# Patient Record
Sex: Female | Born: 1935 | Race: White | Hispanic: No | Marital: Married | Smoking: Former smoker
Health system: Southern US, Community
[De-identification: ages and names within clinical notes are randomized; demographics above are authoritative.]

## PROBLEM LIST (undated history)

## (undated) DIAGNOSIS — I10 Essential (primary) hypertension: Secondary | ICD-10-CM

## (undated) DIAGNOSIS — H01009 Unspecified blepharitis unspecified eye, unspecified eyelid: Secondary | ICD-10-CM

## (undated) DIAGNOSIS — M810 Age-related osteoporosis without current pathological fracture: Secondary | ICD-10-CM

## (undated) DIAGNOSIS — O009 Unspecified ectopic pregnancy without intrauterine pregnancy: Secondary | ICD-10-CM

## (undated) DIAGNOSIS — Z923 Personal history of irradiation: Secondary | ICD-10-CM

## (undated) DIAGNOSIS — C50919 Malignant neoplasm of unspecified site of unspecified female breast: Secondary | ICD-10-CM

## (undated) DIAGNOSIS — D369 Benign neoplasm, unspecified site: Secondary | ICD-10-CM

## (undated) HISTORY — DX: Unspecified blepharitis unspecified eye, unspecified eyelid: H01.009

## (undated) HISTORY — PX: COLONOSCOPY W/ POLYPECTOMY: SHX1380

## (undated) HISTORY — DX: Benign neoplasm, unspecified site: D36.9

## (undated) HISTORY — DX: Unspecified ectopic pregnancy without intrauterine pregnancy: O00.90

## (undated) HISTORY — PX: CATARACT EXTRACTION: SUR2

## (undated) HISTORY — DX: Essential (primary) hypertension: I10

## (undated) HISTORY — PX: ECTOPIC PREGNANCY SURGERY: SHX613

## (undated) HISTORY — DX: Malignant neoplasm of unspecified site of unspecified female breast: C50.919

## (undated) HISTORY — DX: Age-related osteoporosis without current pathological fracture: M81.0

---

## 1990-12-31 DIAGNOSIS — C50919 Malignant neoplasm of unspecified site of unspecified female breast: Secondary | ICD-10-CM

## 1990-12-31 HISTORY — DX: Malignant neoplasm of unspecified site of unspecified female breast: C50.919

## 1992-12-31 HISTORY — PX: TOTAL ABDOMINAL HYSTERECTOMY W/ BILATERAL SALPINGOOPHORECTOMY: SHX83

## 1996-12-31 HISTORY — PX: KNEE ARTHROSCOPY: SUR90

## 1999-07-13 ENCOUNTER — Other Ambulatory Visit: Admission: RE | Admit: 1999-07-13 | Discharge: 1999-07-13 | Payer: Self-pay | Admitting: Obstetrics and Gynecology

## 1999-10-10 ENCOUNTER — Other Ambulatory Visit: Admission: RE | Admit: 1999-10-10 | Discharge: 1999-10-10 | Payer: Self-pay | Admitting: Gastroenterology

## 1999-10-10 DIAGNOSIS — D369 Benign neoplasm, unspecified site: Secondary | ICD-10-CM

## 1999-10-10 HISTORY — DX: Benign neoplasm, unspecified site: D36.9

## 2000-03-22 ENCOUNTER — Ambulatory Visit (HOSPITAL_COMMUNITY): Admission: RE | Admit: 2000-03-22 | Discharge: 2000-03-22 | Payer: Self-pay | Admitting: Surgery

## 2000-03-22 ENCOUNTER — Encounter: Payer: Self-pay | Admitting: Surgery

## 2000-09-25 ENCOUNTER — Other Ambulatory Visit: Admission: RE | Admit: 2000-09-25 | Discharge: 2000-09-25 | Payer: Self-pay | Admitting: Obstetrics and Gynecology

## 2000-09-30 ENCOUNTER — Encounter: Payer: Self-pay | Admitting: Obstetrics and Gynecology

## 2000-09-30 ENCOUNTER — Encounter: Admission: RE | Admit: 2000-09-30 | Discharge: 2000-09-30 | Payer: Self-pay | Admitting: Obstetrics and Gynecology

## 2002-01-29 ENCOUNTER — Other Ambulatory Visit: Admission: RE | Admit: 2002-01-29 | Discharge: 2002-01-29 | Payer: Self-pay | Admitting: Obstetrics and Gynecology

## 2005-12-31 HISTORY — PX: HAND SURGERY: SHX662

## 2006-12-26 ENCOUNTER — Ambulatory Visit (HOSPITAL_COMMUNITY): Admission: RE | Admit: 2006-12-26 | Discharge: 2006-12-27 | Payer: Self-pay | Admitting: Orthopedic Surgery

## 2007-04-28 ENCOUNTER — Other Ambulatory Visit: Admission: RE | Admit: 2007-04-28 | Discharge: 2007-04-28 | Payer: Self-pay | Admitting: Obstetrics and Gynecology

## 2010-12-31 HISTORY — PX: OTHER SURGICAL HISTORY: SHX169

## 2011-07-17 ENCOUNTER — Encounter: Payer: Self-pay | Admitting: Internal Medicine

## 2012-09-18 ENCOUNTER — Encounter: Payer: Self-pay | Admitting: Internal Medicine

## 2013-09-24 ENCOUNTER — Encounter: Payer: Self-pay | Admitting: Obstetrics & Gynecology

## 2014-01-27 ENCOUNTER — Ambulatory Visit: Payer: Self-pay | Admitting: Obstetrics & Gynecology

## 2014-01-28 ENCOUNTER — Encounter: Payer: Self-pay | Admitting: Obstetrics & Gynecology

## 2014-01-29 ENCOUNTER — Ambulatory Visit: Payer: Self-pay | Admitting: Obstetrics & Gynecology

## 2014-02-02 ENCOUNTER — Encounter: Payer: Self-pay | Admitting: Obstetrics & Gynecology

## 2014-02-02 ENCOUNTER — Ambulatory Visit (INDEPENDENT_AMBULATORY_CARE_PROVIDER_SITE_OTHER): Payer: Medicare HMO | Admitting: Obstetrics & Gynecology

## 2014-02-02 VITALS — BP 141/60 | HR 86 | Resp 16 | Ht 60.5 in | Wt 113.0 lb

## 2014-02-02 DIAGNOSIS — Z01419 Encounter for gynecological examination (general) (routine) without abnormal findings: Secondary | ICD-10-CM

## 2014-02-02 NOTE — Progress Notes (Signed)
80 y.o. [id number] MarriedCaucasianF here for annual exam.  Holiday was nice.  Family was here.    Started [employer].  Planning on doing this for two years.  Dr. Dagmar Hait is monitoring.    Seeing Jarome Matin about overall body itch.  She has two places on her face that he is working on  Patient's last menstrual period was 12/31/1992.          Sexually active: no  The current method of family planning is none.    Exercising: yes  walking Smoker:  no  Health Maintenance: Pap:  2008 History of abnormal Pap:  no MMG:  09/2013 Colonoscopy:  2005 BMD:   10/2011 TDaP: Dr. Dagmar Hait follows Screening Labs: Dr. Dagmar Hait, Hb today: PCP, Urine today: PCP   reports that she has quit smoking. She has never used smokeless tobacco. She reports that she drinks about 3.0 ounces of alcohol per week. She reports that she does not use illicit drugs.  Past Medical History  Diagnosis Date  . Breast cancer 1992    right breast   . Hypertension   . Blepharitis   . Osteoporosis   . Ectopic pregnancy   . Colon polyp     Past Surgical History  Procedure Laterality Date  . Ectopic pregnancy surgery      salpingectomy  . Colonoscopy w/ polypectomy    . Hand surgery      right hand-ruptured tendon  . Cataract extraction      2010 right eye, 2011 left eye  . Cyst removed      from right wrist  . Total abdominal hysterectomy w/ bilateral salpingoophorectomy  1994  . Knee arthroscopy  1998    Current Outpatient Prescriptions  Medication Sig Dispense Refill  . amLODipine (NORVASC) 5 MG tablet Take 5 mg by mouth daily.      Marland Kitchen aspirin 81 MG tablet Take 81 mg by mouth daily.      . BD PEN NEEDLE NANO U/F 32G X 4 MM MISC       . Calcium Carbonate-Vitamin D (CALCIUM + D PO) Take 600 mg by mouth 3 (three) times daily.      . clobetasol cream (TEMOVATE) 0.05 %       . doxycycline (VIBRAMYCIN) 100 MG capsule Take 100 mg by mouth daily.      Marland Kitchen FORTEO 600 MCG/2.4ML SOLN       . potassium chloride (K-DUR) 10 MEQ tablet  Take 10 mEq by mouth daily.      . simvastatin (ZOCOR) 20 MG tablet Take 20 mg by mouth daily.      Marland Kitchen telmisartan-hydrochlorothiazide (MICARDIS HCT) 80-12.5 MG per tablet Take 1 tablet by mouth daily.       No current facility-administered medications for this visit.    Family History  Problem Relation Age of Onset  . Osteoporosis Mother   . CVA Father   . Hypertension Father   . Arthritis Mother   . Hypertension Mother     ROS:  Pertinent items are noted in HPI.  Otherwise, a comprehensive ROS was negative.  Exam:   BP 141/60  Pulse 86  Resp 16  Ht 5' 0.5" (1.537 m)  Wt 113 lb (51.256 kg)  BMI 21.70 kg/m2  LMP 12/31/1992  Weight change: -3lb   Height: 5' 0.5" (153.7 cm)  Ht Readings from Last 3 Encounters:  02/02/14 5' 0.5" (1.537 m)    General appearance: alert, cooperative and appears stated age Head: Normocephalic, without obvious  abnormality, atraumatic Neck: no adenopathy, supple, symmetrical, trachea midline and thyroid normal to inspection and palpation Lungs: clear to auscultation bilaterally Breasts: normal appearance, no masses or tenderness, dense findings on right breast consistent with radiation changes Heart: regular rate and rhythm Abdomen: soft, non-tender; bowel sounds normal; no masses,  no organomegaly Extremities: extremities normal, atraumatic, no cyanosis or edema Skin: Skin color, texture, turgor normal. No rashes or lesions Lymph nodes: Cervical, supraclavicular, and axillary nodes normal. No abnormal inguinal nodes palpated Neurologic: Grossly normal   Pelvic: External genitalia:  no lesions              Urethra:  normal appearing urethra with no masses, tenderness or lesions              Bartholins and Skenes: normal                 Vagina: normal appearing vagina with normal color and discharge, no lesions              Cervix: absent              Pap taken: no Bimanual Exam:  Uterus:  No masses              Adnexa: no masses               Rectovaginal: Confirms               Anus:  normal sphincter tone, no lesions  A:  Well Woman with normal exam PMP, no HRT H/O breast cancer 1992 H/O TAH/BSO  P:   Mammogram yearly.  Release signed. pap smear not indicated Labs and vaccines with Dr. Dagmar Hait return annually or prn  An After Visit Summary was printed and given to the patient.

## 2014-02-02 NOTE — Patient Instructions (Signed)

## 2014-03-19 ENCOUNTER — Encounter (HOSPITAL_COMMUNITY): Payer: Self-pay | Admitting: Emergency Medicine

## 2014-03-19 ENCOUNTER — Inpatient Hospital Stay (HOSPITAL_COMMUNITY)
Admission: EM | Admit: 2014-03-19 | Discharge: 2014-03-29 | DRG: 335 | Disposition: A | Discharge status: Home / Self Care | Payer: Medicare PPO | Attending: General Surgery | Admitting: General Surgery

## 2014-03-19 ENCOUNTER — Emergency Department (HOSPITAL_COMMUNITY): Payer: Medicare PPO

## 2014-03-19 ENCOUNTER — Inpatient Hospital Stay (HOSPITAL_COMMUNITY): Payer: Medicare PPO

## 2014-03-19 DIAGNOSIS — Z853 Personal history of malignant neoplasm of breast: Secondary | ICD-10-CM

## 2014-03-19 DIAGNOSIS — I420 Dilated cardiomyopathy: Secondary | ICD-10-CM | POA: Diagnosis present

## 2014-03-19 DIAGNOSIS — J9819 Other pulmonary collapse: Secondary | ICD-10-CM | POA: Diagnosis not present

## 2014-03-19 DIAGNOSIS — K56609 Unspecified intestinal obstruction, unspecified as to partial versus complete obstruction: Secondary | ICD-10-CM | POA: Diagnosis present

## 2014-03-19 DIAGNOSIS — I447 Left bundle-branch block, unspecified: Secondary | ICD-10-CM | POA: Diagnosis present

## 2014-03-19 DIAGNOSIS — I10 Essential (primary) hypertension: Secondary | ICD-10-CM | POA: Diagnosis present

## 2014-03-19 DIAGNOSIS — I509 Heart failure, unspecified: Secondary | ICD-10-CM | POA: Diagnosis present

## 2014-03-19 DIAGNOSIS — I2109 ST elevation (STEMI) myocardial infarction involving other coronary artery of anterior wall: Secondary | ICD-10-CM | POA: Diagnosis present

## 2014-03-19 DIAGNOSIS — E440 Moderate protein-calorie malnutrition: Secondary | ICD-10-CM | POA: Insufficient documentation

## 2014-03-19 DIAGNOSIS — K560 Paralytic ileus: Secondary | ICD-10-CM | POA: Diagnosis not present

## 2014-03-19 DIAGNOSIS — Z7982 Long term (current) use of aspirin: Secondary | ICD-10-CM

## 2014-03-19 DIAGNOSIS — K565 Intestinal adhesions [bands], unspecified as to partial versus complete obstruction: Principal | ICD-10-CM | POA: Diagnosis present

## 2014-03-19 DIAGNOSIS — E876 Hypokalemia: Secondary | ICD-10-CM | POA: Diagnosis not present

## 2014-03-19 DIAGNOSIS — Z87891 Personal history of nicotine dependence: Secondary | ICD-10-CM

## 2014-03-19 DIAGNOSIS — M810 Age-related osteoporosis without current pathological fracture: Secondary | ICD-10-CM | POA: Diagnosis present

## 2014-03-19 DIAGNOSIS — I5181 Takotsubo syndrome: Secondary | ICD-10-CM | POA: Diagnosis present

## 2014-03-19 DIAGNOSIS — E785 Hyperlipidemia, unspecified: Secondary | ICD-10-CM | POA: Diagnosis present

## 2014-03-19 DIAGNOSIS — J9600 Acute respiratory failure, unspecified whether with hypoxia or hypercapnia: Secondary | ICD-10-CM | POA: Diagnosis not present

## 2014-03-19 DIAGNOSIS — H01009 Unspecified blepharitis unspecified eye, unspecified eyelid: Secondary | ICD-10-CM | POA: Diagnosis present

## 2014-03-19 DIAGNOSIS — M129 Arthropathy, unspecified: Secondary | ICD-10-CM | POA: Diagnosis present

## 2014-03-19 DIAGNOSIS — I5041 Acute combined systolic (congestive) and diastolic (congestive) heart failure: Secondary | ICD-10-CM | POA: Diagnosis present

## 2014-03-19 DIAGNOSIS — J9601 Acute respiratory failure with hypoxia: Secondary | ICD-10-CM

## 2014-03-19 LAB — CBC WITH DIFFERENTIAL/PLATELET
BASOS PCT: 0 % (ref 0–1)
Basophils Absolute: 0 10*3/uL (ref 0.0–0.1)
EOS PCT: 0 % (ref 0–5)
Eosinophils Absolute: 0 10*3/uL (ref 0.0–0.7)
HEMATOCRIT: 43.2 % (ref 36.0–46.0)
HEMOGLOBIN: 15.6 g/dL — AB (ref 12.0–15.0)
Lymphocytes Relative: 5 % — ABNORMAL LOW (ref 12–46)
Lymphs Abs: 0.6 10*3/uL — ABNORMAL LOW (ref 0.7–4.0)
MCH: 35 pg — ABNORMAL HIGH (ref 26.0–34.0)
MCHC: 36.1 g/dL — AB (ref 30.0–36.0)
MCV: 96.9 fL (ref 78.0–100.0)
MONO ABS: 0.4 10*3/uL (ref 0.1–1.0)
MONOS PCT: 4 % (ref 3–12)
NEUTROS ABS: 9.9 10*3/uL — AB (ref 1.7–7.7)
Neutrophils Relative %: 91 % — ABNORMAL HIGH (ref 43–77)
Platelets: 209 10*3/uL (ref 150–400)
RBC: 4.46 MIL/uL (ref 3.87–5.11)
RDW: 12.5 % (ref 11.5–15.5)
WBC: 10.9 10*3/uL — ABNORMAL HIGH (ref 4.0–10.5)

## 2014-03-19 LAB — URINALYSIS, ROUTINE W REFLEX MICROSCOPIC
Bilirubin Urine: NEGATIVE
Glucose, UA: 250 mg/dL — AB
Hgb urine dipstick: NEGATIVE
KETONES UR: 15 mg/dL — AB
Leukocytes, UA: NEGATIVE
NITRITE: NEGATIVE
Protein, ur: NEGATIVE mg/dL
Specific Gravity, Urine: 1.011 (ref 1.005–1.030)
UROBILINOGEN UA: 0.2 mg/dL (ref 0.0–1.0)
pH: 8 (ref 5.0–8.0)

## 2014-03-19 LAB — URINE MICROSCOPIC-ADD ON

## 2014-03-19 LAB — COMPREHENSIVE METABOLIC PANEL
ALT: 20 U/L (ref 0–35)
AST: 34 U/L (ref 0–37)
Albumin: 4.8 g/dL (ref 3.5–5.2)
Alkaline Phosphatase: 85 U/L (ref 39–117)
BILIRUBIN TOTAL: 0.8 mg/dL (ref 0.3–1.2)
BUN: 19 mg/dL (ref 6–23)
CALCIUM: 10.7 mg/dL — AB (ref 8.4–10.5)
CHLORIDE: 90 meq/L — AB (ref 96–112)
CO2: 25 meq/L (ref 19–32)
Creatinine, Ser: 0.5 mg/dL (ref 0.50–1.10)
GFR calc Af Amer: >90 mL/min (ref >90)
GFR calc non Af Amer: 88 mL/min — ABNORMAL LOW (ref >90)
Glucose, Bld: 140 mg/dL — ABNORMAL HIGH (ref 70–99)
Potassium: 3.8 mEq/L (ref 3.7–5.3)
Sodium: 134 mEq/L — ABNORMAL LOW (ref 137–147)
Total Protein: 7.6 g/dL (ref 6.0–8.3)

## 2014-03-19 LAB — MAGNESIUM: MAGNESIUM: 1.4 mg/dL — AB (ref 1.5–2.5)

## 2014-03-19 LAB — LIPASE, BLOOD: LIPASE: 22 U/L (ref 11–59)

## 2014-03-19 MED ORDER — DIPHENHYDRAMINE HCL 12.5 MG/5ML PO ELIX
12.5000 mg | ORAL_SOLUTION | Freq: Four times a day (QID) | ORAL | Status: DC | PRN
  Filled 2014-03-19: qty 5

## 2014-03-19 MED ORDER — ONDANSETRON HCL 4 MG/2ML IJ SOLN
4.0000 mg | Freq: Once | INTRAMUSCULAR | Status: AC
  Administered 2014-03-19: 4 mg via INTRAVENOUS
  Filled 2014-03-19: qty 2

## 2014-03-19 MED ORDER — MORPHINE SULFATE 2 MG/ML IJ SOLN
1.0000 mg | INTRAMUSCULAR | Status: DC | PRN
  Administered 2014-03-19: 1 mg via INTRAVENOUS
  Administered 2014-03-19 – 2014-03-23 (×8): 2 mg via INTRAVENOUS
  Filled 2014-03-19 (×10): qty 1

## 2014-03-19 MED ORDER — PROPYLENE GLYCOL 0.6 % OP SOLN: 1.0000 [drp] | Freq: Every day | OPHTHALMIC | Status: DC | PRN

## 2014-03-19 MED ORDER — IOHEXOL 300 MG/ML  SOLN
50.0000 mL | Freq: Once | INTRAMUSCULAR | Status: AC | PRN
  Administered 2014-03-19: 50 mL via ORAL

## 2014-03-19 MED ORDER — POLYVINYL ALCOHOL 1.4 % OP SOLN
2.0000 [drp] | OPHTHALMIC | Status: DC | PRN
  Filled 2014-03-19 (×2): qty 15

## 2014-03-19 MED ORDER — ONDANSETRON HCL 4 MG/2ML IJ SOLN
4.0000 mg | Freq: Four times a day (QID) | INTRAMUSCULAR | Status: DC | PRN
  Administered 2014-03-19 – 2014-03-24 (×8): 4 mg via INTRAVENOUS
  Filled 2014-03-19 (×8): qty 2

## 2014-03-19 MED ORDER — HEPARIN SODIUM (PORCINE) 5000 UNIT/ML IJ SOLN
5000.0000 [IU] | Freq: Three times a day (TID) | INTRAMUSCULAR | Status: DC
  Administered 2014-03-19 – 2014-03-21 (×6): 5000 [IU] via SUBCUTANEOUS
  Filled 2014-03-19 (×9): qty 1

## 2014-03-19 MED ORDER — SODIUM CHLORIDE 0.9 % IV BOLUS (SEPSIS)
1000.0000 mL | Freq: Once | INTRAVENOUS | Status: AC
  Administered 2014-03-19: 1000 mL via INTRAVENOUS

## 2014-03-19 MED ORDER — FAMOTIDINE IN NACL 20-0.9 MG/50ML-% IV SOLN
20.0000 mg | Freq: Two times a day (BID) | INTRAVENOUS | Status: DC
  Administered 2014-03-19 – 2014-03-26 (×15): 20 mg via INTRAVENOUS
  Filled 2014-03-19 (×18): qty 50

## 2014-03-19 MED ORDER — DIPHENHYDRAMINE HCL 50 MG/ML IJ SOLN: 12.5000 mg | Freq: Four times a day (QID) | INTRAMUSCULAR | Status: DC | PRN

## 2014-03-19 MED ORDER — POLYVINYL ALCOHOL-POVIDONE 5-6 MG/ML OP SOLN: 2.0000 [drp] | Freq: Every day | OPHTHALMIC | Status: DC | PRN

## 2014-03-19 MED ORDER — IOHEXOL 300 MG/ML  SOLN
100.0000 mL | Freq: Once | INTRAMUSCULAR | Status: AC | PRN
  Administered 2014-03-19: 100 mL via INTRAVENOUS

## 2014-03-19 MED ORDER — POTASSIUM CHLORIDE IN NACL 20-0.9 MEQ/L-% IV SOLN
INTRAVENOUS | Status: DC
  Administered 2014-03-19 – 2014-03-20 (×2): via INTRAVENOUS
  Filled 2014-03-19 (×4): qty 1000

## 2014-03-19 NOTE — H&P (Signed)
I have seen and examined Kimberly Jordan.  Will attempt non-operative management at this time.  If that fails, she would need laparoscopic or open exploration.  I discussed this with her.

## 2014-03-19 NOTE — ED Notes (Signed)
Bed: WA20 Expected date:  Expected time:  Means of arrival:  Comments: 

## 2014-03-19 NOTE — Progress Notes (Signed)
NG in by IR, atelectasis present on CXR, Ng was not hooked up correctly and I have fixed it. Not much in her stomach now.

## 2014-03-19 NOTE — H&P (Signed)
Kimberly Jordan is an 80 y.o. female.   PCP:  Tivis Ringer, MD  Chief Complaint: Abdominal pain and vomiting HPI:  80 y/o woman, and former [employer] of [place], was at her baseline of health until about 2AM this morning when she developed pain in her abdomen, and said it felt like a balloon was in there.  She then started vomiting and this was followed by dry heaves.  She did not improve and came to the ER where CT scan shows a high-grade small bowel obstruction with transition point locatedwithin the right lower abdominal quadrant. The etiology of the obstruction is not depicted on this examination and thus is presumably secondary to adhesions. No evidence of perforation or drainable fluid collection.2. Age-indeterminate moderate (approximately 50%) compressiondeformity involving the superior endplate of the L3 vertebral body. She remains distended in the ER, she is not nauseated, but still having some abdominal discomfort.  We have attempted to place an NG several times in the ER but it seems to be going to her lung, we are asking IR to place.    Past Medical History  Diagnosis Date  Breast cancer, with lumpectomy and radiation treatment. 1992   right breast   Hypertension   Blepharitis   Osteoporosis   Ectopic pregnancy   Remote history of tobacco use.   Dytslipidemia   Colon polyp     Past Surgical History  Procedure Laterality Date  . Ectopic pregnancy surgery      salpingectomy  . Colonoscopy w/ polypectomy    . Hand surgery  2007    right hand-ruptured tendon  . Cataract extraction  2010, 2011    2010 right eye, 2011 left eye  . Cyst removed  2012    from right wrist  . Total abdominal hysterectomy w/ bilateral salpingoophorectomy  1994  . Knee arthroscopy  1998    Family History  Problem Relation Age of Onset  . Osteoporosis Mother   . CVA Father   . Hypertension Father   . Arthritis Mother   . Hypertension Mother    Social History:  reports that she has  quit smoking. She has never used smokeless tobacco. She reports that she drinks about 3.0 ounces of alcohol per week. She reports that she does not use illicit drugs. Tobacco: 19 years, quit at age 89 DRugs:  None ETOH:  On average a drink per day. Allergies: No Known Allergies  Prior to Admission medications   Medication Sig Start Date End Date Taking? Authorizing Provider  amLODipine (NORVASC) 5 MG tablet Take 5 mg by mouth daily.   Yes Historical Provider, MD  aspirin 81 MG tablet Take 81 mg by mouth daily.   Yes Historical Provider, MD  Calcium Carbonate-Vitamin D (CALCIUM + D PO) Take 600 mg by mouth 3 (three) times daily.   Yes Historical Provider, MD  clobetasol cream (TEMOVATE) 8.36 % Apply 1 application topically as needed (For itching on back).  01/11/14  Yes Historical Provider, MD  doxycycline (VIBRAMYCIN) 100 MG capsule Take 100 mg by mouth daily. 12/22/13  Yes Historical Provider, MD  FORTEO 600 MCG/2.4ML SOLN Inject 20 mcg into the skin daily.  01/08/14  Yes Historical Provider, MD  Polyvinyl Alcohol-Povidone (CLEAR EYES NATURAL TEARS) 5-6 MG/ML SOLN Place 2 drops into both eyes daily as needed.   Yes Historical Provider, MD  potassium chloride (K-DUR) 10 MEQ tablet Take 20 mEq by mouth daily.    Yes Historical Provider, MD  Propylene Glycol (SYSTANE BALANCE) 0.6 %  SOLN Place 1 drop into both eyes daily as needed.   Yes Historical Provider, MD  simvastatin (ZOCOR) 20 MG tablet Take 20 mg by mouth daily.   Yes Historical Provider, MD  telmisartan-hydrochlorothiazide (MICARDIS HCT) 80-12.5 MG per tablet Take 1 tablet by mouth daily.   Yes Historical Provider, MD     Results for orders placed during the hospital encounter of 03/19/14 (from the past 48 hour(s))  URINALYSIS, ROUTINE W REFLEX MICROSCOPIC     Status: Abnormal   Collection Time    03/19/14  9:15 AM      Result Value Ref Range   Color, Urine YELLOW  YELLOW   APPearance TURBID (*) CLEAR   Specific Gravity, Urine 1.011   1.005 - 1.030   pH 8.0  5.0 - 8.0   Glucose, UA 250 (*) NEGATIVE mg/dL   Hgb urine dipstick NEGATIVE  NEGATIVE   Bilirubin Urine NEGATIVE  NEGATIVE   Ketones, ur 15 (*) NEGATIVE mg/dL   Protein, ur NEGATIVE  NEGATIVE mg/dL   Urobilinogen, UA 0.2  0.0 - 1.0 mg/dL   Nitrite NEGATIVE  NEGATIVE   Leukocytes, UA NEGATIVE  NEGATIVE  URINE MICROSCOPIC-ADD ON     Status: None   Collection Time    03/19/14  9:15 AM      Result Value Ref Range   WBC, UA 0-2  <3 WBC/hpf   Urine-Other AMORPHOUS URATES/PHOSPHATES    CBC WITH DIFFERENTIAL     Status: Abnormal   Collection Time    03/19/14  9:25 AM      Result Value Ref Range   WBC 10.9 (*) 4.0 - 10.5 K/uL   RBC 4.46  3.87 - 5.11 MIL/uL   Hemoglobin 15.6 (*) 12.0 - 15.0 g/dL   HCT 43.2  36.0 - 46.0 %   MCV 96.9  78.0 - 100.0 fL   MCH 35.0 (*) 26.0 - 34.0 pg   MCHC 36.1 (*) 30.0 - 36.0 g/dL   RDW 12.5  11.5 - 15.5 %   Platelets 209  150 - 400 K/uL   Neutrophils Relative % 91 (*) 43 - 77 %   Neutro Abs 9.9 (*) 1.7 - 7.7 K/uL   Lymphocytes Relative 5 (*) 12 - 46 %   Lymphs Abs 0.6 (*) 0.7 - 4.0 K/uL   Monocytes Relative 4  3 - 12 %   Monocytes Absolute 0.4  0.1 - 1.0 K/uL   Eosinophils Relative 0  0 - 5 %   Eosinophils Absolute 0.0  0.0 - 0.7 K/uL   Basophils Relative 0  0 - 1 %   Basophils Absolute 0.0  0.0 - 0.1 K/uL  COMPREHENSIVE METABOLIC PANEL     Status: Abnormal   Collection Time    03/19/14  9:25 AM      Result Value Ref Range   Sodium 134 (*) 137 - 147 mEq/L   Potassium 3.8  3.7 - 5.3 mEq/L   Chloride 90 (*) 96 - 112 mEq/L   CO2 25  19 - 32 mEq/L   Glucose, Bld 140 (*) 70 - 99 mg/dL   BUN 19  6 - 23 mg/dL   Creatinine, Ser 0.50  0.50 - 1.10 mg/dL   Calcium 10.7 (*) 8.4 - 10.5 mg/dL   Total Protein 7.6  6.0 - 8.3 g/dL   Albumin 4.8  3.5 - 5.2 g/dL   AST 34  0 - 37 U/L   ALT 20  0 - 35 U/L   Alkaline  Phosphatase 85  39 - 117 U/L   Total Bilirubin 0.8  0.3 - 1.2 mg/dL   GFR calc non Af Amer 88 (*) >90 mL/min   GFR calc  Af Amer >90  >90 mL/min   Comment: (NOTE)     The eGFR has been calculated using the CKD EPI equation.     This calculation has not been validated in all clinical situations.     eGFR's persistently <90 mL/min signify possible Chronic Kidney     Disease.  LIPASE, BLOOD     Status: None   Collection Time    03/19/14  9:25 AM      Result Value Ref Range   Lipase 22  11 - 59 U/L   Ct Abdomen Pelvis W Contrast  03/19/2014   CLINICAL DATA:  Lower abdominal pain, worse within the right lower abdominal quadrant, history of breast cancer, post lumpectomy and radiation therapy  EXAM: CT ABDOMEN AND PELVIS WITH CONTRAST  TECHNIQUE: Multidetector CT imaging of the abdomen and pelvis was performed using the standard protocol following bolus administration of intravenous contrast.  CONTRAST:  171m OMNIPAQUE IOHEXOL 300 MG/ML  SOLN  COMPARISON:  None.  FINDINGS: There is marked distension of the upstream small bowel with transition point located within the right lower abdominal quadrant (axial image 53, series 2, coronal image 42, series 5). This finding is associated with decompression of the downstream small bowel as well as the colon. This finding is associated with minimal amount of free fluid within the pelvic cul-de-sac but without definable/drainable fluid collection. No definite evidence perforation. No pneumoperitoneum or pneumatosis. The appendix is not visualized, however there is no inflammatory change within the right lower abdominal quadrant.  Normal hepatic contour. Scattered sub cm hypo attenuating renal lesions too small to actually characterize are favored to represent axis. Normal appearance of the gallbladder given a distention. No definite radiopaque gallstones. No intra extra pedicular duct dilatation. No ascites.  There is symmetric enhancement and excretion of the bilateral kidneys. Note is made of bilateral extrarenal pelvises. No definite evidence of urinary obstruction. The definite renal  stones on this postcontrast examination. Bilateral subcentimeter hypoattenuating renal lesions are too small to adequately characterize are favored to represent renal cysts. Normal appearance of the bilateral adrenal glands, pancreas and spleen.  Scattered atherosclerotic plaque within a normal caliber abdominal aorta. The major branch vessels of the abdominal aorta appear patent on this non CTA examination. Distal note is made of a common origin of the celiac and SMA day. This still note is made of a retro aortic left-sided renal vein. No definite bulky retroperitoneal, mesenteric, pelvic or inguinal lymphadenopathy.  Post hysterectomy.  No discrete adnexal lesion.  Limited visualization of the lower thorax demonstrates minimal dependent ground-glass atelectasis. There is minimal subsegmental atelectasis about the left major fissure. No pleural effusion. Normal heart size. Minimal coronary artery calcifications. No pericardial effusion.  Age-indeterminate moderate (approximately 50%) compression deformity involving the superior endplate of the L3 vertebral body with approximately 4 mm of retropulsion of the superior endplate towards the spinal canal. There is minimal (approximately 4 mm) of anterolisthesis of L4 upon L5 without associated pars defect.  Calcifications are noted within the right breast (image 2).  IMPRESSION: 1. High-grade small bowel obstruction with transition point located within the right lower abdominal quadrant. The etiology of the obstruction is not depicted on this examination and thus is presumably secondary to adhesions. No evidence of perforation or drainable fluid collection. 2. Age-indeterminate moderate (  approximately 50%) compression deformity involving the superior endplate of the L3 vertebral body. Correlation point tenderness at this location is recommended.   Electronically Signed   By: Sandi Mariscal M.D.   On: 03/19/2014 10:54    Review of Systems  Constitutional: Negative.    HENT: Positive for congestion (nasal congestion early AM), hearing loss (some; but she hears me easlily with just normal conversation level speech.) and tinnitus (It has been worse in the past).   Eyes: Positive for redness.       Chronic blepharitis Vision is not as good as before, she has new glasses prescription she has not filled yet.  Respiratory: Positive for cough (dry, not really productive.). Negative for hemoptysis, sputum production, shortness of breath and wheezing.   Cardiovascular: Positive for leg swelling. PND: some LE swelling after being up all day, goes down at night.  Gastrointestinal: Positive for heartburn, vomiting (started after onset of abdominal pain and distension) and abdominal pain (it started early this AM about 2 AM). Negative for nausea, diarrhea, constipation, blood in stool and melena.       She notes no BM yesterday, and this is an unusual occurrence for her.   Genitourinary: Negative.   Musculoskeletal:       She complains of arthritis all over.  Skin: Positive for rash (rash in summer treated with steroids/cream).  Neurological: Negative.   Endo/Heme/Allergies: Bruises/bleeds easily.  Psychiatric/Behavioral: Negative for depression, suicidal ideas, hallucinations, memory loss and substance abuse. The patient is not nervous/anxious and does not have insomnia.        She does not handle stress as well as she use to.      Blood pressure 166/67, pulse 102, temperature 97.9 F (36.6 C), temperature source Oral, resp. rate 20, last menstrual period 12/31/1992, SpO2 93.00%. Physical Exam  Constitutional: She is oriented to person, place, and time. No distress.  Thin frail appearing woman who appears her stated age.  HENT:  Head: Normocephalic and atraumatic.  Nose: Nose normal.  Eyes: Conjunctivae and EOM are normal. Pupils are equal, round, and reactive to light. Right eye exhibits no discharge. Left eye exhibits no discharge. No scleral icterus.  Neck:  Normal range of motion. Neck supple. No JVD present. No tracheal deviation present. No thyromegaly present.  Cardiovascular: Regular rhythm, normal heart sounds and intact distal pulses.  Exam reveals no gallop.   No murmur heard. HR about 100 at exam   Respiratory: Effort normal and breath sounds normal. No respiratory distress. She has no wheezes. She has no rales. She exhibits no tenderness.  GI: Soft. She exhibits distension. She exhibits no mass. There is tenderness (minimal). There is no rebound and no guarding.  Hyperactive BS Healed Midline incision below the umbilicus.  Musculoskeletal: She exhibits edema (trace). She exhibits no tenderness.  Lymphadenopathy:    She has no cervical adenopathy.  Neurological: She is alert and oriented to person, place, and time. No cranial nerve deficit.  Skin: Skin is warm and dry. No rash noted. She is not diaphoretic. No erythema. No pallor.  Psychiatric: She has a normal mood and affect. Her behavior is normal. Judgment and thought content normal.     Assessment/Plan 1.  SBO with hx of total abdominal hysterectomy bilateral SOO 1994 2.  Hypertension 3.  Hx of right breast cancer 4.  Blepharitis 5.  Dyslipidemia 6.  Osteoporosis 7.  Arthritis   Plan:  We will admit and have an NG placed.  Decompress her and provide  bowel rest.  IV hydration, watch her BP.  Recheck films and labs in AM.  Kveon Casanas 03/19/2014, 12:29 PM

## 2014-03-19 NOTE — ED Notes (Signed)
Patient transported to CT 

## 2014-03-19 NOTE — ED Notes (Signed)
Per pt, states she woke up with abdominal pain and bloating-nauseated and vomiting on and off-increased urination

## 2014-03-19 NOTE — ED Provider Notes (Signed)
CSN: [id number]     Arrival date & time 03/19/14  6712 History   First MD Initiated Contact with Patient 03/19/14 [phone]     Chief Complaint  Patient presents with  . Abdominal Pain     (Consider location/radiation/quality/duration/timing/severity/associated sxs/prior Treatment) The history is provided by the patient.  Kimberly Jordan is a 80 y.o. female hx of breast Ca, HTN, hysterectomy here with abdominal distention. Acute onset of abdominal distention at 2 AM this morning. Also nonbilious and nonbloody vomit. Unable to keep anything down today. She had been passing some gas today but no bowel movement. She had a hysterectomy in the past. Denies any fevers or chills. Had some urinary frequency but no dysuria.   Past Medical History  Diagnosis Date  . Breast cancer 1992    right breast   . Hypertension   . Blepharitis   . Osteoporosis   . Ectopic pregnancy   . Colon polyp    Past Surgical History  Procedure Laterality Date  . Ectopic pregnancy surgery      salpingectomy  . Colonoscopy w/ polypectomy    . Hand surgery  2007    right hand-ruptured tendon  . Cataract extraction  2010, 2011    2010 right eye, 2011 left eye  . Cyst removed  2012    from right wrist  . Total abdominal hysterectomy w/ bilateral salpingoophorectomy  1994  . Knee arthroscopy  1998   Family History  Problem Relation Age of Onset  . Osteoporosis Mother   . CVA Father   . Hypertension Father   . Arthritis Mother   . Hypertension Mother    History  Substance Use Topics  . Smoking status: Former Research scientist (life sciences)  . Smokeless tobacco: Never Used  . Alcohol Use: 3.0 oz/week    5 Glasses of wine per week     Comment: wine daily   OB History   Grav Para Term Preterm Abortions TAB SAB Ect Mult Living   4 1   3  2 1  1      Review of Systems  Gastrointestinal: Positive for vomiting and abdominal pain.  All other systems reviewed and are negative.      Allergies  Review of patient's allergies  indicates no known allergies.  Home Medications   Current Outpatient Rx  Name  Route  Sig  Dispense  Refill  . amLODipine (NORVASC) 5 MG tablet   Oral   Take 5 mg by mouth daily.         Marland Kitchen aspirin 81 MG tablet   Oral   Take 81 mg by mouth daily.         . Calcium Carbonate-Vitamin D (CALCIUM + D PO)   Oral   Take 600 mg by mouth 3 (three) times daily.         . clobetasol cream (TEMOVATE) 0.05 %   Topical   Apply 1 application topically as needed (For itching on back).          . doxycycline (VIBRAMYCIN) 100 MG capsule   Oral   Take 100 mg by mouth daily.         Marland Kitchen FORTEO 600 MCG/2.4ML SOLN   Subcutaneous   Inject 20 mcg into the skin daily.          . Polyvinyl Alcohol-Povidone (CLEAR EYES NATURAL TEARS) 5-6 MG/ML SOLN   Both Eyes   Place 2 drops into both eyes daily as needed.         Marland Kitchen  potassium chloride (K-DUR) 10 MEQ tablet   Oral   Take 20 mEq by mouth daily.          Marland Kitchen Propylene Glycol (SYSTANE BALANCE) 0.6 % SOLN   Both Eyes   Place 1 drop into both eyes daily as needed.         . simvastatin (ZOCOR) 20 MG tablet   Oral   Take 20 mg by mouth daily.         Marland Kitchen telmisartan-hydrochlorothiazide (MICARDIS HCT) 80-12.5 MG per tablet   Oral   Take 1 tablet by mouth daily.          BP 166/67  Pulse 102  Temp(Src) 97.9 F (36.6 C) (Oral)  Resp 20  SpO2 93%  LMP 12/31/1992 Physical Exam  Nursing note and vitals reviewed. Constitutional: She is oriented to person, place, and time.  Chronically ill. Slightly uncomfortable   HENT:  Head: Normocephalic.  MM slightly dry   Eyes: Conjunctivae are normal. Pupils are equal, round, and reactive to light.  Neck: Normal range of motion. Neck supple.  Cardiovascular: Regular rhythm and normal heart sounds.   Mildly tachy   Pulmonary/Chest: Effort normal and breath sounds normal. No respiratory distress. She has no wheezes. She has no rales.  Abdominal: Soft.  Distended, mild diffuse  tenderness, no rebound or guarding   Musculoskeletal: Normal range of motion. She exhibits no edema and no tenderness.  Neurological: She is alert and oriented to person, place, and time. No cranial nerve deficit. Coordination normal.  Skin: Skin is warm and dry.  Psychiatric: She has a normal mood and affect. Her behavior is normal. Judgment and thought content normal.    ED Course  Procedures (including critical care time) Labs Review Labs Reviewed  CBC WITH DIFFERENTIAL - Abnormal; Notable for the following:    WBC 10.9 (*)    Hemoglobin 15.6 (*)    MCH 35.0 (*)    MCHC 36.1 (*)    Neutrophils Relative % 91 (*)    Neutro Abs 9.9 (*)    Lymphocytes Relative 5 (*)    Lymphs Abs 0.6 (*)    All other components within normal limits  COMPREHENSIVE METABOLIC PANEL - Abnormal; Notable for the following:    Sodium 134 (*)    Chloride 90 (*)    Glucose, Bld 140 (*)    Calcium 10.7 (*)    GFR calc non Af Amer 88 (*)    All other components within normal limits  URINALYSIS, ROUTINE W REFLEX MICROSCOPIC - Abnormal; Notable for the following:    APPearance TURBID (*)    Glucose, UA 250 (*)    Ketones, ur 15 (*)    All other components within normal limits  MAGNESIUM - Abnormal; Notable for the following:    Magnesium 1.4 (*)    All other components within normal limits  LIPASE, BLOOD  URINE MICROSCOPIC-ADD ON   Imaging Review Ct Abdomen Pelvis W Contrast  03/19/2014   CLINICAL DATA:  Lower abdominal pain, worse within the right lower abdominal quadrant, history of breast cancer, post lumpectomy and radiation therapy  EXAM: CT ABDOMEN AND PELVIS WITH CONTRAST  TECHNIQUE: Multidetector CT imaging of the abdomen and pelvis was performed using the standard protocol following bolus administration of intravenous contrast.  CONTRAST:  163mL OMNIPAQUE IOHEXOL 300 MG/ML  SOLN  COMPARISON:  None.  FINDINGS: There is marked distension of the upstream small bowel with transition point located  within the right lower abdominal quadrant (  axial image 53, series 2, coronal image 42, series 5). This finding is associated with decompression of the downstream small bowel as well as the colon. This finding is associated with minimal amount of free fluid within the pelvic cul-de-sac but without definable/drainable fluid collection. No definite evidence perforation. No pneumoperitoneum or pneumatosis. The appendix is not visualized, however there is no inflammatory change within the right lower abdominal quadrant.  Normal hepatic contour. Scattered sub cm hypo attenuating renal lesions too small to actually characterize are favored to represent axis. Normal appearance of the gallbladder given a distention. No definite radiopaque gallstones. No intra extra pedicular duct dilatation. No ascites.  There is symmetric enhancement and excretion of the bilateral kidneys. Note is made of bilateral extrarenal pelvises. No definite evidence of urinary obstruction. The definite renal stones on this postcontrast examination. Bilateral subcentimeter hypoattenuating renal lesions are too small to adequately characterize are favored to represent renal cysts. Normal appearance of the bilateral adrenal glands, pancreas and spleen.  Scattered atherosclerotic plaque within a normal caliber abdominal aorta. The major branch vessels of the abdominal aorta appear patent on this non CTA examination. Distal note is made of a common origin of the celiac and SMA day. This still note is made of a retro aortic left-sided renal vein. No definite bulky retroperitoneal, mesenteric, pelvic or inguinal lymphadenopathy.  Post hysterectomy.  No discrete adnexal lesion.  Limited visualization of the lower thorax demonstrates minimal dependent ground-glass atelectasis. There is minimal subsegmental atelectasis about the left major fissure. No pleural effusion. Normal heart size. Minimal coronary artery calcifications. No pericardial effusion.   Age-indeterminate moderate (approximately 50%) compression deformity involving the superior endplate of the L3 vertebral body with approximately 4 mm of retropulsion of the superior endplate towards the spinal canal. There is minimal (approximately 4 mm) of anterolisthesis of L4 upon L5 without associated pars defect.  Calcifications are noted within the right breast (image 2).  IMPRESSION: 1. High-grade small bowel obstruction with transition point located within the right lower abdominal quadrant. The etiology of the obstruction is not depicted on this examination and thus is presumably secondary to adhesions. No evidence of perforation or drainable fluid collection. 2. Age-indeterminate moderate (approximately 50%) compression deformity involving the superior endplate of the L3 vertebral body. Correlation point tenderness at this location is recommended.   Electronically Signed   By: Sandi Mariscal M.D.   On: 03/19/2014 10:54     EKG Interpretation None      MDM   Final diagnoses:  None   Kimberly Jordan is a 80 y.o. female here with abdominal pain, distention. Given previous surgery, concerned for possible SBO. Will get labs, CT ab/pel.   12:46 PM CT showed SBO. Surgery evaluated and will admit. Nursing tried to place NG and were unsuccessful. I tried and was unsuccessful. Will call IR to arrange for NG placement. Will admit to surgery.     Wandra Arthurs, MD 03/19/14 1247

## 2014-03-19 NOTE — Progress Notes (Signed)
UR completed 

## 2014-03-20 ENCOUNTER — Inpatient Hospital Stay (HOSPITAL_COMMUNITY): Payer: Medicare PPO

## 2014-03-20 LAB — BASIC METABOLIC PANEL
BUN: 16 mg/dL (ref 6–23)
CO2: 23 mEq/L (ref 19–32)
Calcium: 9.2 mg/dL (ref 8.4–10.5)
Chloride: 95 mEq/L — ABNORMAL LOW (ref 96–112)
Creatinine, Ser: 0.5 mg/dL (ref 0.50–1.10)
GFR calc Af Amer: >90 mL/min (ref >90)
GFR calc non Af Amer: 88 mL/min — ABNORMAL LOW (ref >90)
Glucose, Bld: 135 mg/dL — ABNORMAL HIGH (ref 70–99)
Potassium: 3.4 mEq/L — ABNORMAL LOW (ref 3.7–5.3)
SODIUM: 137 meq/L (ref 137–147)

## 2014-03-20 LAB — CBC
HCT: 41.8 % (ref 36.0–46.0)
Hemoglobin: 14.5 g/dL (ref 12.0–15.0)
MCH: 34 pg (ref 26.0–34.0)
MCHC: 34.7 g/dL (ref 30.0–36.0)
MCV: 97.9 fL (ref 78.0–100.0)
PLATELETS: 171 10*3/uL (ref 150–400)
RBC: 4.27 MIL/uL (ref 3.87–5.11)
RDW: 12.8 % (ref 11.5–15.5)
WBC: 16.3 10*3/uL — AB (ref 4.0–10.5)

## 2014-03-20 LAB — PROTIME-INR
INR: 1.12 (ref 0.00–1.49)
Prothrombin Time: 14.2 seconds (ref 11.6–15.2)

## 2014-03-20 LAB — APTT: aPTT: 35 seconds (ref 24–37)

## 2014-03-20 MED ORDER — BISACODYL 10 MG RE SUPP
10.0000 mg | Freq: Every day | RECTAL | Status: DC | PRN
  Administered 2014-03-21: 10 mg via RECTAL
  Filled 2014-03-20: qty 1

## 2014-03-20 MED ORDER — PROMETHAZINE HCL 25 MG/ML IJ SOLN
12.5000 mg | Freq: Once | INTRAMUSCULAR | Status: AC
  Administered 2014-03-20: 12.5 mg via INTRAVENOUS
  Filled 2014-03-20: qty 1

## 2014-03-20 MED ORDER — DEXTROSE IN LACTATED RINGERS 5 % IV SOLN
INTRAVENOUS | Status: DC
  Administered 2014-03-20 – 2014-03-22 (×4): via INTRAVENOUS

## 2014-03-20 MED ORDER — VITAMINS A & D EX OINT
TOPICAL_OINTMENT | CUTANEOUS | Status: AC
  Administered 2014-03-20: 5
  Filled 2014-03-20: qty 5

## 2014-03-20 NOTE — Progress Notes (Signed)
Subjective: Rough night but better this am.  No flatus or BM.   Objective: Vital signs in last 24 hours: Temp:  [97.9 F (36.6 C)-98.8 F (37.1 C)] 98.3 F (36.8 C) (03/21 0546) Pulse Rate:  [85-105] 93 (03/21 0546) Resp:  [16-20] 18 (03/21 0546) BP: (152-166)/(67-83) 152/81 mmHg (03/21 0546) SpO2:  [90 %-98 %] 90 % (03/21 0546) Weight:  [120 lb (54.432 kg)] 120 lb (54.432 kg) (03/20 1600)    Intake/Output from previous day: 03/20 0701 - 03/21 0700 In: 1538.3 [I.V.:1438.3; IV Piggyback:100] Out: 1350 [Urine:950; Emesis/NG output:400] Intake/Output this shift:    GI: MILD DISTENSION.  VERY SOFT.  NO REBOUND OR GUARDING.  SOME BS NOTED    Lab Results:   Recent Labs  03/19/14 0925 03/20/14 0503  WBC 10.9* 16.3*  HGB 15.6* 14.5  HCT 43.2 41.8  PLT 209 171   BMET  Recent Labs  03/19/14 0925 03/20/14 0503  NA 134* 137  K 3.8 3.4*  CL 90* 95*  CO2 25 23  GLUCOSE 140* 135*  BUN 19 16  CREATININE 0.50 0.50  CALCIUM 10.7* 9.2   PT/INR  Recent Labs  03/20/14 0503  LABPROT 14.2  INR 1.12   ABG No results found for this basename: PHART, PCO2, PO2, HCO3,  in the last 72 hours  Studies/Results: Dg Chest 2 View  03/19/2014   CLINICAL DATA:  Cough  EXAM: CHEST  2 VIEW  COMPARISON:  12/20/2006  FINDINGS: Cardiac shadow is stable. The lungs are well aerated bilaterally. Minimal bibasilar atelectasis is seen slightly worse on the left than the right. No focal confluent infiltrate is noted. Postsurgical changes are again seen on the right. A nasogastric catheter is noted within the stomach.  IMPRESSION: Mild bibasilar atelectasis left greater than right.   Electronically Signed   By: Inez Catalina M.D.   On: 03/19/2014 15:03   Ct Abdomen Pelvis W Contrast  03/19/2014   CLINICAL DATA:  Lower abdominal pain, worse within the right lower abdominal quadrant, history of breast cancer, post lumpectomy and radiation therapy  EXAM: CT ABDOMEN AND PELVIS WITH CONTRAST   TECHNIQUE: Multidetector CT imaging of the abdomen and pelvis was performed using the standard protocol following bolus administration of intravenous contrast.  CONTRAST:  157mL OMNIPAQUE IOHEXOL 300 MG/ML  SOLN  COMPARISON:  None.  FINDINGS: There is marked distension of the upstream small bowel with transition point located within the right lower abdominal quadrant (axial image 53, series 2, coronal image 42, series 5). This finding is associated with decompression of the downstream small bowel as well as the colon. This finding is associated with minimal amount of free fluid within the pelvic cul-de-sac but without definable/drainable fluid collection. No definite evidence perforation. No pneumoperitoneum or pneumatosis. The appendix is not visualized, however there is no inflammatory change within the right lower abdominal quadrant.  Normal hepatic contour. Scattered sub cm hypo attenuating renal lesions too small to actually characterize are favored to represent axis. Normal appearance of the gallbladder given a distention. No definite radiopaque gallstones. No intra extra pedicular duct dilatation. No ascites.  There is symmetric enhancement and excretion of the bilateral kidneys. Note is made of bilateral extrarenal pelvises. No definite evidence of urinary obstruction. The definite renal stones on this postcontrast examination. Bilateral subcentimeter hypoattenuating renal lesions are too small to adequately characterize are favored to represent renal cysts. Normal appearance of the bilateral adrenal glands, pancreas and spleen.  Scattered atherosclerotic plaque within a normal caliber abdominal  aorta. The major branch vessels of the abdominal aorta appear patent on this non CTA examination. Distal note is made of a common origin of the celiac and SMA day. This still note is made of a retro aortic left-sided renal vein. No definite bulky retroperitoneal, mesenteric, pelvic or inguinal lymphadenopathy.  Post  hysterectomy.  No discrete adnexal lesion.  Limited visualization of the lower thorax demonstrates minimal dependent ground-glass atelectasis. There is minimal subsegmental atelectasis about the left major fissure. No pleural effusion. Normal heart size. Minimal coronary artery calcifications. No pericardial effusion.  Age-indeterminate moderate (approximately 50%) compression deformity involving the superior endplate of the L3 vertebral body with approximately 4 mm of retropulsion of the superior endplate towards the spinal canal. There is minimal (approximately 4 mm) of anterolisthesis of L4 upon L5 without associated pars defect.  Calcifications are noted within the right breast (image 2).  IMPRESSION: 1. High-grade small bowel obstruction with transition point located within the right lower abdominal quadrant. The etiology of the obstruction is not depicted on this examination and thus is presumably secondary to adhesions. No evidence of perforation or drainable fluid collection. 2. Age-indeterminate moderate (approximately 50%) compression deformity involving the superior endplate of the L3 vertebral body. Correlation point tenderness at this location is recommended.   Electronically Signed   By: Sandi Mariscal M.D.   On: 03/19/2014 10:54   Dg Abd Portable 2v  03/20/2014   CLINICAL DATA:  Abdominal pain.  Follow-up small bowel obstruction.  EXAM: PORTABLE ABDOMEN - 2 VIEW  COMPARISON:  CT abdomen and pelvis yesterday.  FINDINGS: Gaseous distention of multiple loops of small bowel throughout the abdomen with air-fluid levels on the lateral decubitus image, not significantly changed. Nasogastric tube tip in the gastric antrum. No free intraperitoneal air. Oral contrast material from the CT still within the many of the distended loops of small bowel. Moderate stool burden throughout decompressed colon. Degenerative changes involving the lumbar spine.  IMPRESSION: Stable partial small bowel obstruction. No free  intraperitoneal air.   Electronically Signed   By: Evangeline Dakin M.D.   On: 03/20/2014 05:45   Dg Loyce Dys Tube Plc W/fl W/rad  03/19/2014   CLINICAL DATA:  NG tube placement. Tube could not be advanced in the emergency department.  EXAM: NASO G TUBE PLACEMENT WITH FL AND WITH RAD  FLUOROSCOPY TIME:  8 seconds  COMPARISON:  None  FINDINGS: A nasogastric tube provided by the emergency department was placed through the right nare without difficulty. The distal aspect of the tube is within the stomach. There are no immediate complications. The tube was secured to the nose.  IMPRESSION: Successful fluoroscopic guided placement of a nasogastric tube.   Electronically Signed   By: Kathreen Devoid   On: 03/19/2014 16:04    Anti-infectives: Anti-infectives   None      Assessment/Plan: SBO Cont IVF Recheck films in AM Dulcolax for colonic stool burden Labs in am, No evidence of peritonitis on exam Patient Active Problem List   Diagnosis Date Noted  . SBO (small bowel obstruction) 03/19/2014    LOS: 1 day    Dewel Lotter A. 03/20/2014

## 2014-03-21 ENCOUNTER — Inpatient Hospital Stay (HOSPITAL_COMMUNITY): Payer: Medicare PPO

## 2014-03-21 DIAGNOSIS — E876 Hypokalemia: Secondary | ICD-10-CM

## 2014-03-21 LAB — COMPREHENSIVE METABOLIC PANEL
ALBUMIN: 3.4 g/dL — AB (ref 3.5–5.2)
ALK PHOS: 63 U/L (ref 39–117)
ALT: 13 U/L (ref 0–35)
AST: 23 U/L (ref 0–37)
BILIRUBIN TOTAL: 1 mg/dL (ref 0.3–1.2)
BUN: 13 mg/dL (ref 6–23)
CO2: 28 mEq/L (ref 19–32)
Calcium: 9.1 mg/dL (ref 8.4–10.5)
Chloride: 97 mEq/L (ref 96–112)
Creatinine, Ser: 0.43 mg/dL — ABNORMAL LOW (ref 0.50–1.10)
GFR calc Af Amer: >90 mL/min (ref >90)
GFR calc non Af Amer: >90 mL/min (ref >90)
Glucose, Bld: 155 mg/dL — ABNORMAL HIGH (ref 70–99)
POTASSIUM: 3.1 meq/L — AB (ref 3.7–5.3)
SODIUM: 138 meq/L (ref 137–147)
Total Protein: 6.2 g/dL (ref 6.0–8.3)

## 2014-03-21 LAB — CBC
HCT: 39.6 % (ref 36.0–46.0)
Hemoglobin: 13.6 g/dL (ref 12.0–15.0)
MCH: 33.6 pg (ref 26.0–34.0)
MCHC: 34.3 g/dL (ref 30.0–36.0)
MCV: 97.8 fL (ref 78.0–100.0)
Platelets: 167 10*3/uL (ref 150–400)
RBC: 4.05 MIL/uL (ref 3.87–5.11)
RDW: 12.7 % (ref 11.5–15.5)
WBC: 11.1 10*3/uL — ABNORMAL HIGH (ref 4.0–10.5)

## 2014-03-21 MED ORDER — HEPARIN SODIUM (PORCINE) 5000 UNIT/ML IJ SOLN
5000.0000 [IU] | Freq: Three times a day (TID) | INTRAMUSCULAR | Status: DC
  Administered 2014-03-22 – 2014-03-25 (×8): 5000 [IU] via SUBCUTANEOUS
  Filled 2014-03-21 (×12): qty 1

## 2014-03-21 MED ORDER — POTASSIUM CHLORIDE 10 MEQ/100ML IV SOLN
10.0000 meq | INTRAVENOUS | Status: AC
  Administered 2014-03-21 (×4): 10 meq via INTRAVENOUS
  Filled 2014-03-21 (×4): qty 100

## 2014-03-21 MED ORDER — CHLORHEXIDINE GLUCONATE 0.12 % MT SOLN
15.0000 mL | Freq: Two times a day (BID) | OROMUCOSAL | Status: DC
  Administered 2014-03-21 – 2014-03-27 (×10): 15 mL via OROMUCOSAL
  Filled 2014-03-21 (×14): qty 15

## 2014-03-21 NOTE — Plan of Care (Signed)
Problem: Phase III Progression Outcomes Goal: Pain controlled on oral analgesia Outcome: Not Met (add Reason) Pt npo     

## 2014-03-21 NOTE — Plan of Care (Signed)
Problem: Phase I Progression Outcomes Goal: Initial discharge plan identified Outcome: Not Met (add Reason) Too soon  Problem: Phase II Progression Outcomes Goal: IV changed to normal saline lock Outcome: Not Met (add Reason) Pt npo

## 2014-03-21 NOTE — Progress Notes (Signed)
Clamped Pt's NGT as ordered about 0830. By 12 pm pt was c/o of "feeling sick on her stomach, and nauseous". Applied Intermittent LWS back to her NGT.  After I gave pt suppository soon afterwards, she started passing gas and stated that she felt better.

## 2014-03-21 NOTE — Progress Notes (Signed)
Subjective: POSSIBLE SMALL AMOUNT OF FLATUS.  PAIN TOLERABLE.    Objective: Vital signs in last 24 hours: Temp:  [97.9 F (36.6 C)-99.4 F (37.4 C)] 97.9 F (36.6 C) (03/22 0550) Pulse Rate:  [86-96] 86 (03/22 0550) Resp:  [18] 18 (03/22 0550) BP: (147-166)/(64-80) 147/78 mmHg (03/22 0550) SpO2:  [91 %-96 %] 91 % (03/22 0550)    Intake/Output from previous day: 03/21 0701 - 03/22 0700 In: 2960 [P.O.:60; I.V.:2800; IV Piggyback:100] Out: 1300 [Urine:1300] Intake/Output this shift:    GI: NON DISTENDED AND SFT.  MIN TTP DIFFUSELY.  BS ACTIVE AND NORMAL NO PERITONITIS  Lab Results:   Recent Labs  03/20/14 0503 03/21/14 0531  WBC 16.3* 11.1*  HGB 14.5 13.6  HCT 41.8 39.6  PLT 171 167   BMET  Recent Labs  03/20/14 0503 03/21/14 0531  NA 137 138  K 3.4* 3.1*  CL 95* 97  CO2 23 28  GLUCOSE 135* 155*  BUN 16 13  CREATININE 0.50 0.43*  CALCIUM 9.2 9.1   PT/INR  Recent Labs  03/20/14 0503  LABPROT 14.2  INR 1.12   ABG No results found for this basename: PHART, PCO2, PO2, HCO3,  in the last 72 hours  Studies/Results: Dg Chest 2 View  03/19/2014   CLINICAL DATA:  Cough  EXAM: CHEST  2 VIEW  COMPARISON:  12/20/2006  FINDINGS: Cardiac shadow is stable. The lungs are well aerated bilaterally. Minimal bibasilar atelectasis is seen slightly worse on the left than the right. No focal confluent infiltrate is noted. Postsurgical changes are again seen on the right. A nasogastric catheter is noted within the stomach.  IMPRESSION: Mild bibasilar atelectasis left greater than right.   Electronically Signed   By: Inez Catalina M.D.   On: 03/19/2014 15:03   Ct Abdomen Pelvis W Contrast  03/19/2014   CLINICAL DATA:  Lower abdominal pain, worse within the right lower abdominal quadrant, history of breast cancer, post lumpectomy and radiation therapy  EXAM: CT ABDOMEN AND PELVIS WITH CONTRAST  TECHNIQUE: Multidetector CT imaging of the abdomen and pelvis was performed using  the standard protocol following bolus administration of intravenous contrast.  CONTRAST:  175mL OMNIPAQUE IOHEXOL 300 MG/ML  SOLN  COMPARISON:  None.  FINDINGS: There is marked distension of the upstream small bowel with transition point located within the right lower abdominal quadrant (axial image 53, series 2, coronal image 42, series 5). This finding is associated with decompression of the downstream small bowel as well as the colon. This finding is associated with minimal amount of free fluid within the pelvic cul-de-sac but without definable/drainable fluid collection. No definite evidence perforation. No pneumoperitoneum or pneumatosis. The appendix is not visualized, however there is no inflammatory change within the right lower abdominal quadrant.  Normal hepatic contour. Scattered sub cm hypo attenuating renal lesions too small to actually characterize are favored to represent axis. Normal appearance of the gallbladder given a distention. No definite radiopaque gallstones. No intra extra pedicular duct dilatation. No ascites.  There is symmetric enhancement and excretion of the bilateral kidneys. Note is made of bilateral extrarenal pelvises. No definite evidence of urinary obstruction. The definite renal stones on this postcontrast examination. Bilateral subcentimeter hypoattenuating renal lesions are too small to adequately characterize are favored to represent renal cysts. Normal appearance of the bilateral adrenal glands, pancreas and spleen.  Scattered atherosclerotic plaque within a normal caliber abdominal aorta. The major branch vessels of the abdominal aorta appear patent on this non CTA examination.  Distal note is made of a common origin of the celiac and SMA day. This still note is made of a retro aortic left-sided renal vein. No definite bulky retroperitoneal, mesenteric, pelvic or inguinal lymphadenopathy.  Post hysterectomy.  No discrete adnexal lesion.  Limited visualization of the lower  thorax demonstrates minimal dependent ground-glass atelectasis. There is minimal subsegmental atelectasis about the left major fissure. No pleural effusion. Normal heart size. Minimal coronary artery calcifications. No pericardial effusion.  Age-indeterminate moderate (approximately 50%) compression deformity involving the superior endplate of the L3 vertebral body with approximately 4 mm of retropulsion of the superior endplate towards the spinal canal. There is minimal (approximately 4 mm) of anterolisthesis of L4 upon L5 without associated pars defect.  Calcifications are noted within the right breast (image 2).  IMPRESSION: 1. High-grade small bowel obstruction with transition point located within the right lower abdominal quadrant. The etiology of the obstruction is not depicted on this examination and thus is presumably secondary to adhesions. No evidence of perforation or drainable fluid collection. 2. Age-indeterminate moderate (approximately 50%) compression deformity involving the superior endplate of the L3 vertebral body. Correlation point tenderness at this location is recommended.   Electronically Signed   By: Sandi Mariscal M.D.   On: 03/19/2014 10:54   Dg Abd Portable 2v  03/20/2014   CLINICAL DATA:  Abdominal pain.  Follow-up small bowel obstruction.  EXAM: PORTABLE ABDOMEN - 2 VIEW  COMPARISON:  CT abdomen and pelvis yesterday.  FINDINGS: Gaseous distention of multiple loops of small bowel throughout the abdomen with air-fluid levels on the lateral decubitus image, not significantly changed. Nasogastric tube tip in the gastric antrum. No free intraperitoneal air. Oral contrast material from the CT still within the many of the distended loops of small bowel. Moderate stool burden throughout decompressed colon. Degenerative changes involving the lumbar spine.  IMPRESSION: Stable partial small bowel obstruction. No free intraperitoneal air.   Electronically Signed   By: Evangeline Dakin M.D.   On:  03/20/2014 05:45   Dg Loyce Dys Tube Plc W/fl W/rad  03/19/2014   CLINICAL DATA:  NG tube placement. Tube could not be advanced in the emergency department.  EXAM: NASO G TUBE PLACEMENT WITH FL AND WITH RAD  FLUOROSCOPY TIME:  8 seconds  COMPARISON:  None  FINDINGS: A nasogastric tube provided by the emergency department was placed through the right nare without difficulty. The distal aspect of the tube is within the stomach. There are no immediate complications. The tube was secured to the nose.  IMPRESSION: Successful fluoroscopic guided placement of a nasogastric tube.   Electronically Signed   By: Kathreen Devoid   On: 03/19/2014 16:04    Anti-infectives: Anti-infectives   None      Assessment/Plan: SBO  SOME FLATUS AND WBC BETTER.  NOT MUCH OUT NGT.  CLAMP NG AND SEE IF IT CAN BE REMOVED LATER TODAY.  HYPOKALEMIA  REPLACE K  Cont IVF  Recheck films in AM  Dulcolax for colonic stool burden  Labs in am.  No evidence of peritonitis on exam  Patient Active Problem List    Diagnosis  Date Noted   .  SBO (small bowel obstruction)  03/19/2014    LOS: 1 day     Roe Wilner A. 03/21/2014

## 2014-03-21 NOTE — Progress Notes (Signed)
03/21/14 2040 Dr Lucia Gaskins notified of patient presenting with several bright red clots in urine tonight. Order to hold next two doses of heparin. Will continue to monitor patient.

## 2014-03-22 ENCOUNTER — Encounter (HOSPITAL_COMMUNITY): Admission: EM | Disposition: A | Discharge status: Home / Self Care | Payer: Self-pay | Source: Home / Self Care

## 2014-03-22 ENCOUNTER — Inpatient Hospital Stay (HOSPITAL_COMMUNITY): Payer: Medicare PPO

## 2014-03-22 ENCOUNTER — Inpatient Hospital Stay (HOSPITAL_COMMUNITY): Payer: Medicare PPO | Admitting: Certified Registered"

## 2014-03-22 ENCOUNTER — Encounter (HOSPITAL_COMMUNITY): Payer: Medicare PPO | Admitting: Certified Registered"

## 2014-03-22 ENCOUNTER — Encounter (HOSPITAL_COMMUNITY): Payer: Self-pay | Admitting: Certified Registered"

## 2014-03-22 DIAGNOSIS — K565 Intestinal adhesions [bands], unspecified as to partial versus complete obstruction: Secondary | ICD-10-CM

## 2014-03-22 HISTORY — PX: LAPAROTOMY: SHX154

## 2014-03-22 LAB — CBC
HCT: 38.6 % (ref 36.0–46.0)
Hemoglobin: 13 g/dL (ref 12.0–15.0)
MCH: 33.3 pg (ref 26.0–34.0)
MCHC: 33.7 g/dL (ref 30.0–36.0)
MCV: 99 fL (ref 78.0–100.0)
PLATELETS: 146 10*3/uL — AB (ref 150–400)
RBC: 3.9 MIL/uL (ref 3.87–5.11)
RDW: 12.7 % (ref 11.5–15.5)
WBC: 10.7 10*3/uL — ABNORMAL HIGH (ref 4.0–10.5)

## 2014-03-22 LAB — SURGICAL PCR SCREEN
MRSA, PCR: NEGATIVE
Staphylococcus aureus: NEGATIVE

## 2014-03-22 LAB — BASIC METABOLIC PANEL
BUN: 14 mg/dL (ref 6–23)
CALCIUM: 8.8 mg/dL (ref 8.4–10.5)
CO2: 28 meq/L (ref 19–32)
CREATININE: 0.45 mg/dL — AB (ref 0.50–1.10)
Chloride: 98 mEq/L (ref 96–112)
GFR calc Af Amer: >90 mL/min (ref >90)
GFR calc non Af Amer: >90 mL/min (ref >90)
Glucose, Bld: 149 mg/dL — ABNORMAL HIGH (ref 70–99)
Potassium: 3.2 mEq/L — ABNORMAL LOW (ref 3.7–5.3)
Sodium: 136 mEq/L — ABNORMAL LOW (ref 137–147)

## 2014-03-22 LAB — MAGNESIUM: MAGNESIUM: 1.7 mg/dL (ref 1.5–2.5)

## 2014-03-22 LAB — GLUCOSE, CAPILLARY: Glucose-Capillary: 145 mg/dL — ABNORMAL HIGH (ref 70–99)

## 2014-03-22 SURGERY — LAPAROTOMY, EXPLORATORY
Anesthesia: General | Site: Abdomen

## 2014-03-22 MED ORDER — LIDOCAINE HCL (CARDIAC) 20 MG/ML IV SOLN
INTRAVENOUS | Status: AC
  Filled 2014-03-22: qty 5

## 2014-03-22 MED ORDER — ROCURONIUM BROMIDE 100 MG/10ML IV SOLN
INTRAVENOUS | Status: AC
  Filled 2014-03-22: qty 1

## 2014-03-22 MED ORDER — PROPOFOL 10 MG/ML IV BOLUS
INTRAVENOUS | Status: DC | PRN
  Administered 2014-03-22: 160 mg via INTRAVENOUS

## 2014-03-22 MED ORDER — FENTANYL CITRATE 0.05 MG/ML IJ SOLN
INTRAMUSCULAR | Status: DC | PRN
  Administered 2014-03-22 (×3): 50 ug via INTRAVENOUS
  Administered 2014-03-22: 100 ug via INTRAVENOUS

## 2014-03-22 MED ORDER — SUCCINYLCHOLINE CHLORIDE 20 MG/ML IJ SOLN
INTRAMUSCULAR | Status: DC | PRN
  Administered 2014-03-22: 100 mg via INTRAVENOUS

## 2014-03-22 MED ORDER — NEOSTIGMINE METHYLSULFATE 1 MG/ML IJ SOLN
INTRAMUSCULAR | Status: DC | PRN
  Administered 2014-03-22: 4 mg via INTRAVENOUS

## 2014-03-22 MED ORDER — ROCURONIUM BROMIDE 100 MG/10ML IV SOLN
INTRAVENOUS | Status: DC | PRN
  Administered 2014-03-22: 30 mg via INTRAVENOUS

## 2014-03-22 MED ORDER — ONDANSETRON HCL 4 MG/2ML IJ SOLN
INTRAMUSCULAR | Status: AC
  Filled 2014-03-22: qty 2

## 2014-03-22 MED ORDER — LACTATED RINGERS IV SOLN: INTRAVENOUS | Status: DC

## 2014-03-22 MED ORDER — LACTATED RINGERS IV SOLN
INTRAVENOUS | Status: DC | PRN
  Administered 2014-03-22 (×2): via INTRAVENOUS

## 2014-03-22 MED ORDER — PROPOFOL 10 MG/ML IV BOLUS
INTRAVENOUS | Status: AC
  Filled 2014-03-22: qty 20

## 2014-03-22 MED ORDER — GLYCOPYRROLATE 0.2 MG/ML IJ SOLN
INTRAMUSCULAR | Status: DC | PRN
  Administered 2014-03-22: 0.6 mg via INTRAVENOUS

## 2014-03-22 MED ORDER — CEFAZOLIN SODIUM-DEXTROSE 2-3 GM-% IV SOLR
2.0000 g | Freq: Once | INTRAVENOUS | Status: AC
  Administered 2014-03-22: 2 g via INTRAVENOUS
  Filled 2014-03-22: qty 50

## 2014-03-22 MED ORDER — LACTATED RINGERS IV SOLN: INTRAVENOUS | Status: DC | PRN

## 2014-03-22 MED ORDER — HYDROMORPHONE HCL PF 1 MG/ML IJ SOLN
0.2500 mg | INTRAMUSCULAR | Status: DC | PRN
  Administered 2014-03-22 (×2): 0.25 mg via INTRAVENOUS
  Administered 2014-03-22: 0.5 mg via INTRAVENOUS

## 2014-03-22 MED ORDER — HYDROMORPHONE HCL PF 1 MG/ML IJ SOLN
INTRAMUSCULAR | Status: AC
  Filled 2014-03-22: qty 1

## 2014-03-22 MED ORDER — LACTATED RINGERS IV SOLN
INTRAVENOUS | Status: DC
  Administered 2014-03-22: 1000 mL via INTRAVENOUS

## 2014-03-22 MED ORDER — CEFAZOLIN SODIUM-DEXTROSE 2-3 GM-% IV SOLR
INTRAVENOUS | Status: AC
  Filled 2014-03-22: qty 50

## 2014-03-22 MED ORDER — POTASSIUM CHLORIDE IN NACL 40-0.9 MEQ/L-% IV SOLN
INTRAVENOUS | Status: DC
  Administered 2014-03-22 – 2014-03-24 (×6): via INTRAVENOUS
  Filled 2014-03-22 (×9): qty 1000

## 2014-03-22 MED ORDER — SODIUM CHLORIDE 0.9 % IR SOLN
Status: DC | PRN
  Administered 2014-03-22: 1000 mL

## 2014-03-22 MED ORDER — LIDOCAINE HCL (CARDIAC) 20 MG/ML IV SOLN
INTRAVENOUS | Status: DC | PRN
  Administered 2014-03-22: 50 mg via INTRAVENOUS

## 2014-03-22 MED ORDER — PROMETHAZINE HCL 25 MG/ML IJ SOLN: 6.2500 mg | INTRAMUSCULAR | Status: DC | PRN

## 2014-03-22 MED ORDER — ONDANSETRON HCL 4 MG/2ML IJ SOLN
INTRAMUSCULAR | Status: DC | PRN
  Administered 2014-03-22: 4 mg via INTRAVENOUS

## 2014-03-22 MED ORDER — LACTATED RINGERS IV SOLN
INTRAVENOUS | Status: DC | PRN
  Administered 2014-03-22: 11:00:00 via INTRAVENOUS

## 2014-03-22 MED ORDER — PHENYLEPHRINE HCL 10 MG/ML IJ SOLN
INTRAMUSCULAR | Status: DC | PRN
  Administered 2014-03-22: 40 ug via INTRAVENOUS

## 2014-03-22 MED ORDER — FENTANYL CITRATE 0.05 MG/ML IJ SOLN
INTRAMUSCULAR | Status: AC
  Filled 2014-03-22: qty 5

## 2014-03-22 SURGICAL SUPPLY — 34 items
APPLICATOR COTTON TIP 6IN STRL (MISCELLANEOUS) ×2 IMPLANT
BLADE EXTENDED COATED 6.5IN (ELECTRODE) ×1 IMPLANT
BLADE HEX COATED 2.75 (ELECTRODE) ×2 IMPLANT
CANISTER SUCTION 2500CC (MISCELLANEOUS) ×2 IMPLANT
COVER MAYO STAND STRL (DRAPES) ×1 IMPLANT
DRAPE LAPAROSCOPIC ABDOMINAL (DRAPES) ×2 IMPLANT
DRAPE WARM FLUID 44X44 (DRAPE) ×1 IMPLANT
ELECT REM PT RETURN 9FT ADLT (ELECTROSURGICAL) ×2
ELECTRODE REM PT RTRN 9FT ADLT (ELECTROSURGICAL) ×1 IMPLANT
GLOVE BIO SURGEON STRL SZ7.5 (GLOVE) ×4 IMPLANT
GLOVE BIOGEL PI IND STRL 7.0 (GLOVE) ×1 IMPLANT
GLOVE BIOGEL PI INDICATOR 7.0 (GLOVE) ×1
GOWN STRL REUS W/ TWL XL LVL3 (GOWN DISPOSABLE) ×1 IMPLANT
GOWN STRL REUS W/TWL LRG LVL3 (GOWN DISPOSABLE) ×1 IMPLANT
GOWN STRL REUS W/TWL XL LVL3 (GOWN DISPOSABLE) ×6 IMPLANT
KIT BASIN OR (CUSTOM PROCEDURE TRAY) ×2 IMPLANT
NS IRRIG 1000ML POUR BTL (IV SOLUTION) ×2 IMPLANT
PACK GENERAL/GYN (CUSTOM PROCEDURE TRAY) ×2 IMPLANT
SPONGE GAUZE 4X4 12PLY (GAUZE/BANDAGES/DRESSINGS) ×2 IMPLANT
SPONGE LAP 18X18 X RAY DECT (DISPOSABLE) IMPLANT
STAPLER VISISTAT 35W (STAPLE) ×2 IMPLANT
SUCTION POOLE TIP (SUCTIONS) IMPLANT
SUT PDS AB 1 CTX 36 (SUTURE) IMPLANT
SUT PDS AB 1 TP1 96 (SUTURE) ×2 IMPLANT
SUT SILK 2 0 (SUTURE) ×2
SUT SILK 2 0 SH CR/8 (SUTURE) ×1 IMPLANT
SUT SILK 2-0 18XBRD TIE 12 (SUTURE) IMPLANT
SUT SILK 3 0 (SUTURE) ×2
SUT SILK 3 0 SH CR/8 (SUTURE) ×1 IMPLANT
SUT SILK 3-0 18XBRD TIE 12 (SUTURE) IMPLANT
TOWEL OR 17X26 10 PK STRL BLUE (TOWEL DISPOSABLE) ×4 IMPLANT
TRAY FOLEY CATH 14FRSI W/METER (CATHETERS) ×1 IMPLANT
WATER STERILE IRR 1500ML POUR (IV SOLUTION) ×2 IMPLANT
YANKAUER SUCT BULB TIP NO VENT (SUCTIONS) IMPLANT

## 2014-03-22 NOTE — Progress Notes (Signed)
Resumed care of patient.  No change in patient assessment.  Pt aaox3.  Pt denies pain at this time.  Pt dressing stained, dry and intact.  Will continue to monitor.

## 2014-03-22 NOTE — Anesthesia Preprocedure Evaluation (Addendum)
Anesthesia Evaluation  Patient identified by MRN, date of birth, ID band Patient awake    Reviewed: Allergy & Precautions, H&P , NPO status , Patient's Chart, lab work & pertinent test results  Airway Mallampati: II TM Distance: >3 FB Neck ROM: Full    Dental  (+) Dental Advisory Given, Teeth Intact   Pulmonary neg pulmonary ROS, former smoker,  breath sounds clear to auscultation  Pulmonary exam normal       Cardiovascular hypertension, Pt. on medications negative cardio ROS  Rhythm:Regular Rate:Normal     Neuro/Psych negative neurological ROS  negative psych ROS   GI/Hepatic negative GI ROS, Neg liver ROS,   Endo/Other  negative endocrine ROS  Renal/GU negative Renal ROS  negative genitourinary   Musculoskeletal negative musculoskeletal ROS (+)   Abdominal   Peds  Hematology negative hematology ROS (+)   Anesthesia Other Findings   Reproductive/Obstetrics                          Anesthesia Physical Anesthesia Plan  ASA: III and emergent  Anesthesia Plan: General   Post-op Pain Management:    Induction: Intravenous, Cricoid pressure planned and Rapid sequence  Airway Management Planned: Oral ETT  Additional Equipment:   Intra-op Plan:   Post-operative Plan: Possible Post-op intubation/ventilation  Informed Consent: I have reviewed the patients History and Physical, chart, labs and discussed the procedure including the risks, benefits and alternatives for the proposed anesthesia with the patient or authorized representative who has indicated his/her understanding and acceptance.   Dental advisory given  Plan Discussed with: CRNA  Anesthesia Plan Comments:         Anesthesia Quick Evaluation

## 2014-03-22 NOTE — Preoperative (Signed)
Beta Blockers   Reason not to administer Beta Blockers:Not Applicable 

## 2014-03-22 NOTE — Progress Notes (Signed)
Pt arrived back from OR/PACU her VSS, pt is alert but drowsy, honeycomb DSG to ABD has small blood stain mark, will continue to monitor. Her family is at the bedside. No c/o pain at this time, no discomfort or distress noted at this time. Will continue to monitor.

## 2014-03-22 NOTE — Transfer of Care (Signed)
Immediate Anesthesia Transfer of Care Note  Patient: Kimberly Jordan  Procedure(s) Performed: Procedure(s): EXPLORATORY LAPAROTOMY (N/A)  Patient Location: PACU  Anesthesia Type:General  Level of Consciousness: awake, alert  and oriented  Airway & Oxygen Therapy: Patient Spontanous Breathing and Patient connected to face mask oxygen  Post-op Assessment: Report given to PACU RN and Post -op Vital signs reviewed and stable  Post vital signs: Reviewed and stable  Complications: No apparent anesthesia complications

## 2014-03-22 NOTE — Op Note (Signed)
03/19/2014 - 03/22/2014  12:48 PM  PATIENT:  Kimberly Jordan  80 y.o. female  PRE-OPERATIVE DIAGNOSIS:  bowel obstruction  POST-OPERATIVE DIAGNOSIS:  small bowel obstruction secondary to adhesive band  PROCEDURE:  Procedure(s): EXPLORATORY LAPAROTOMY (N/A) Lysis of adhesions  SURGEON:  Surgeon(s) and Role:    * Merrie Roof, MD - Primary  PHYSICIAN ASSISTANT:   ASSISTANTS: none   ANESTHESIA:   general  EBL:  Total I/O In: 1000 [I.V.:1000] Out: 850 [Urine:100; Other:700; Blood:50]  BLOOD ADMINISTERED:none  DRAINS: none   LOCAL MEDICATIONS USED:  NONE  SPECIMEN:  No Specimen  DISPOSITION OF SPECIMEN:  N/A  COUNTS:  YES  TOURNIQUET:  * No tourniquets in log *  DICTATION: .Dragon Dictation After informed consent was obtained the patient was brought to the operating room placed in the supine position on the operating table. After adequate induction of general anesthesia the patient's abdomen was prepped with ChloraPrep, allowed to dry, and draped in usual sterile manner. A lower midline incision was made with a 10 blade knife. This incision was carried through the skin and subcutaneous tissue sharply with electrocautery until the linea alba was identified. The linea alba was also incised with the electrocautery. The peritoneum was identified. The peritoneum was opened sharply with Metzenbaum scissors which therefore gave [place] access to the abdominal cavity. The rest of the incision was opened under direct vision with the electrocautery. There was some omental adhesion to the anterior bowel wall that was taken down sharply with Metzenbaum scissors. Once this was accomplished we then eviscerated the small bowel and ran the small bowel from the ligament of Treitz to the ileocecal valve. In the distal small bowel we found a single adhesive band which the small bowel had herniated underneath and was obstructed. We lysed this band sharply with the electrocautery. This relieved the  obstruction. The rest of the small bowel and colon was evaluated and no other significant abnormalities were noted. The colon was full of thick stool with multiple diverticuli throughout the colon but no areas of inflammation were identified. The NG tube was confirmed in the stomach. Some of the small bowel contents were milked back towards the stomach and evacuated with the NG tube in order to try to decompress the abdomen. There was then irrigated with copious amounts of saline. The small bowel was returned to the abdominal cavity. The fascia of the anterior bowel wall was then closed with 2 running #1 double-stranded looped PDS sutures. The subcutaneous tissue was irrigated with copious amounts of saline. The skin was then closed with staples. Sterile dressings were applied. The patient tolerated the procedure well. At the end of the case all needle sponge and instrument counts were correct. The patient was then awakened taken to recovery in stable condition.  PLAN OF CARE: Admit to inpatient   PATIENT DISPOSITION:  PACU - hemodynamically stable.   Delay start of Pharmacological VTE agent (>24hrs) due to surgical blood loss or risk of bleeding: no

## 2014-03-22 NOTE — Anesthesia Postprocedure Evaluation (Signed)
Anesthesia Post Note  Patient: Kimberly Jordan  Procedure(s) Performed: Procedure(s) (LRB): EXPLORATORY LAPAROTOMY (N/A)  Anesthesia type: General  Patient location: PACU  Post pain: Pain level controlled  Post assessment: Post-op Vital signs reviewed  Last Vitals:  Filed Vitals:   03/22/14 1510  BP: 135/69  Pulse: 78  Temp: 36.5 C  Resp: 18    Post vital signs: Reviewed  Level of consciousness: sedated  Complications: No apparent anesthesia complications

## 2014-03-22 NOTE — Progress Notes (Signed)
Subjective: No complaints. She did not tolerate having ng clamped yesterday  Objective: Vital signs in last 24 hours: Temp:  [97.5 F (36.4 C)-98.1 F (36.7 C)] 97.5 F (36.4 C) (03/23 0522) Pulse Rate:  [88-93] 88 (03/23 0522) Resp:  [12-16] 16 (03/23 0522) BP: (147-151)/(71-81) 151/71 mmHg (03/23 0522) SpO2:  [91 %-92 %] 92 % (03/23 0522) Weight:  [107 lb 8 oz (48.762 kg)-109 lb 4.8 oz (49.578 kg)] 109 lb 4.8 oz (49.578 kg) (03/23 0300) Last BM Date: 03/21/14  Intake/Output from previous day: 03/22 0701 - 03/23 0700 In: 2850 [I.V.:2350; IV Piggyback:500] Out: 1200 [Urine:950; Emesis/NG output:250] Intake/Output this shift:    Resp: clear to auscultation bilaterally Cardio: regular rate and rhythm GI: soft, nontender. still distended. no real flatus  Lab Results:   Recent Labs  03/21/14 0531 03/22/14 0456  WBC 11.1* 10.7*  HGB 13.6 13.0  HCT 39.6 38.6  PLT 167 146*   BMET  Recent Labs  03/21/14 0531 03/22/14 0456  NA 138 136*  K 3.1* 3.2*  CL 97 98  CO2 28 28  GLUCOSE 155* 149*  BUN 13 14  CREATININE 0.43* 0.45*  CALCIUM 9.1 8.8   PT/INR  Recent Labs  03/20/14 0503  LABPROT 14.2  INR 1.12   ABG No results found for this basename: PHART, PCO2, PO2, HCO3,  in the last 72 hours  Studies/Results: Dg Abd 1 View  03/22/2014   CLINICAL DATA:  Small bowel obstruction, clinically stable  EXAM: ABDOMEN - 1 VIEW  COMPARISON:  DG ABDOMEN 1V dated 03/21/2014; CT ABD/PELVIS W CM dated 03/19/2014; DG ABD PORTABLE 2V dated 03/20/2014  FINDINGS: There is an esophagogastric tube present whose tip and proximal port lie within the gastric body. There remain loops of moderately distended gas-filled small bowel over the midline. These have improved somewhat. There is a gas collection superiorly in the region of the dome of the liver which has not significantly changed since the study of March 21st. The patient underwent a left side down decubitus film at that time that  did not reveal free extraluminal gas.  There are small amounts of stool and gas within the colon and rectum. There is radiodense material in the right aspect of the pelvis which is consistent with contrast in a distended small bowel loop seen on the CT scan of March 20th. This is unchanged in appearance today as compared to yesterday's study.  IMPRESSION: 1. The findings are consistent with a fairly high-grade mid to distal small bowel obstruction. The degree of gaseous distention has improved slightly which may be related to the nasogastric tube suction. A small amount of colonic gas and stool is present. 2. A stable appearing gas collection in the right upper quadrant at the level of the dome of the diaphragm may be related to basilar atelectasis or infiltrate in the right lower lobe. Repeat abdominal CT scanning may be useful and would allow assessment of the right lung base and right upper quadrant of the abdomen.   Electronically Signed   By: David  Martinique   On: 03/22/2014 09:24   Dg Abd 1 View  03/21/2014   CLINICAL DATA:  Abdominal pains, small bowel obstruction  EXAM: ABDOMEN - 1 VIEW  COMPARISON:  03/20/2014  FINDINGS: Stable dilated loops of small bowel in the right lower abdomen, measuring up to 4.5 cm, compatible with small bowel obstruction.  Enteric tube terminates in the distal gastric body/antrum.  IMPRESSION: Stable small bowel obstruction.   Electronically Signed  By: Julian Hy M.D.   On: 03/21/2014 09:32    Anti-infectives: Anti-infectives   None      Assessment/Plan: s/p * No surgery found * Xrays have shown minimal change since friday and she did not tolerate ng clamping yesterday. Because of this I feel she would benefit from exploration. I have discussed the risks and benefits of surgery with her including some of the technical aspects including the risk of bowel injury and possible ostomy and she understands and wishes to proceed.  LOS: 3 days    TOTH III,PAUL  S 03/22/2014

## 2014-03-23 ENCOUNTER — Encounter (HOSPITAL_COMMUNITY): Payer: Self-pay | Admitting: General Surgery

## 2014-03-23 DIAGNOSIS — I10 Essential (primary) hypertension: Secondary | ICD-10-CM

## 2014-03-23 LAB — MAGNESIUM: Magnesium: 1.5 mg/dL (ref 1.5–2.5)

## 2014-03-23 LAB — BASIC METABOLIC PANEL
BUN: 19 mg/dL (ref 6–23)
CO2: 24 mEq/L (ref 19–32)
CREATININE: 0.49 mg/dL — AB (ref 0.50–1.10)
Calcium: 8.2 mg/dL — ABNORMAL LOW (ref 8.4–10.5)
Chloride: 102 mEq/L (ref 96–112)
GFR calc Af Amer: >90 mL/min (ref >90)
GFR, EST NON AFRICAN AMERICAN: 88 mL/min — AB (ref >90)
GLUCOSE: 107 mg/dL — AB (ref 70–99)
POTASSIUM: 3.9 meq/L (ref 3.7–5.3)
Sodium: 139 mEq/L (ref 137–147)

## 2014-03-23 MED ORDER — AMLODIPINE BESYLATE 5 MG PO TABS
5.0000 mg | ORAL_TABLET | Freq: Every day | ORAL | Status: DC
  Administered 2014-03-23 – 2014-03-27 (×5): 5 mg via ORAL
  Filled 2014-03-23 (×6): qty 1

## 2014-03-23 MED ORDER — AMLODIPINE 1 MG/ML ORAL SUSPENSION: 5.0000 mg | Freq: Every day | ORAL | Status: DC

## 2014-03-23 NOTE — Progress Notes (Signed)
1 Day Post-Op  Subjective: No complaints. Notes some soreness  Objective: Vital signs in last 24 hours: Temp:  [97.3 F (36.3 C)-98.8 F (37.1 C)] 98.8 F (37.1 C) (03/24 0530) Pulse Rate:  [66-103] 95 (03/24 0530) Resp:  [13-18] 18 (03/24 0530) BP: (135-170)/(68-86) 163/84 mmHg (03/24 0530) SpO2:  [94 %-100 %] 95 % (03/24 0530) Weight:  [115 lb 4.8 oz (52.3 kg)] 115 lb 4.8 oz (52.3 kg) (03/24 0532) Last BM Date: 03/21/14  Intake/Output from previous day: 03/23 0701 - 03/24 0700 In: 2913.3 [I.V.:2913.3] Out: 2020 [Urine:1270; Blood:50] Intake/Output this shift:    Resp: clear to auscultation bilaterally Cardio: regular rate and rhythm GI: soft, mild tenderness. few bs. incision looks good  Lab Results:   Recent Labs  03/21/14 0531 03/22/14 0456  WBC 11.1* 10.7*  HGB 13.6 13.0  HCT 39.6 38.6  PLT 167 146*   BMET  Recent Labs  03/22/14 0456 03/23/14 0451  NA 136* 139  K 3.2* 3.9  CL 98 102  CO2 28 24  GLUCOSE 149* 107*  BUN 14 19  CREATININE 0.45* 0.49*  CALCIUM 8.8 8.2*   PT/INR No results found for this basename: LABPROT, INR,  in the last 72 hours ABG No results found for this basename: PHART, PCO2, PO2, HCO3,  in the last 72 hours  Studies/Results: Dg Abd 1 View  03/22/2014   CLINICAL DATA:  Small bowel obstruction, clinically stable  EXAM: ABDOMEN - 1 VIEW  COMPARISON:  DG ABDOMEN 1V dated 03/21/2014; CT ABD/PELVIS W CM dated 03/19/2014; DG ABD PORTABLE 2V dated 03/20/2014  FINDINGS: There is an esophagogastric tube present whose tip and proximal port lie within the gastric body. There remain loops of moderately distended gas-filled small bowel over the midline. These have improved somewhat. There is a gas collection superiorly in the region of the dome of the liver which has not significantly changed since the study of March 21st. The patient underwent a left side down decubitus film at that time that did not reveal free extraluminal gas.  There are small  amounts of stool and gas within the colon and rectum. There is radiodense material in the right aspect of the pelvis which is consistent with contrast in a distended small bowel loop seen on the CT scan of March 20th. This is unchanged in appearance today as compared to yesterday's study.  IMPRESSION: 1. The findings are consistent with a fairly high-grade mid to distal small bowel obstruction. The degree of gaseous distention has improved slightly which may be related to the nasogastric tube suction. A small amount of colonic gas and stool is present. 2. A stable appearing gas collection in the right upper quadrant at the level of the dome of the diaphragm may be related to basilar atelectasis or infiltrate in the right lower lobe. Repeat abdominal CT scanning may be useful and would allow assessment of the right lung base and right upper quadrant of the abdomen.   Electronically Signed   By: David  [place]   On: 03/22/2014 09:24    Anti-infectives: Anti-infectives   Start     Dose/Rate Route Frequency Ordered Stop   03/22/14 1100  ceFAZolin (ANCEF) IVPB 2 g/50 mL premix     2 g 100 mL/hr over 30 Minutes Intravenous  Once 03/22/14 1032 03/22/14 1150      Assessment/Plan: s/p Procedure(s): EXPLORATORY LAPAROTOMY (N/A) HTN. Will restart norvasc oral suspension today OOB Continue ng for now  LOS: 4 days    TOTH  III,PAUL S 03/23/2014

## 2014-03-24 MED ORDER — METOCLOPRAMIDE HCL 5 MG/ML IJ SOLN
5.0000 mg | Freq: Three times a day (TID) | INTRAMUSCULAR | Status: AC
  Administered 2014-03-24 – 2014-03-26 (×5): 5 mg via INTRAVENOUS
  Filled 2014-03-24 (×2): qty 1
  Filled 2014-03-24: qty 2
  Filled 2014-03-24 (×2): qty 1
  Filled 2014-03-24: qty 2
  Filled 2014-03-24: qty 1
  Filled 2014-03-24: qty 2

## 2014-03-24 NOTE — Progress Notes (Signed)
2 Days Post-Op  Subjective: Complains of nausea right now. Otherwise seems to be doing well  Objective: Vital signs in last 24 hours: Temp:  [98.2 F (36.8 C)-99.1 F (37.3 C)] 98.3 F (36.8 C) (03/25 2505) Pulse Rate:  [92-109] 92 (03/25 0608) Resp:  [18] 18 (03/25 0608) BP: (145-158)/(70-82) 145/80 mmHg (03/25 0608) SpO2:  [93 %-95 %] 95 % (03/25 0608) Weight:  [116 lb 2.9 oz (52.7 kg)] 116 lb 2.9 oz (52.7 kg) (03/25 0630) Last BM Date: 03/21/14  Intake/Output from previous day: 03/24 0701 - 03/25 0700 In: 2273.3 [I.V.:2223.3; IV Piggyback:50] Out: 3976 [Urine:1750; Emesis/NG output:125] Intake/Output this shift:    Resp: clear to auscultation bilaterally Cardio: regular rate and rhythm GI: soft, mild tenderness. few bs. incision ok  Lab Results:   Recent Labs  03/22/14 0456  WBC 10.7*  HGB 13.0  HCT 38.6  PLT 146*   BMET  Recent Labs  03/22/14 0456 03/23/14 0451  NA 136* 139  K 3.2* 3.9  CL 98 102  CO2 28 24  GLUCOSE 149* 107*  BUN 14 19  CREATININE 0.45* 0.49*  CALCIUM 8.8 8.2*   PT/INR No results found for this basename: LABPROT, INR,  in the last 72 hours ABG No results found for this basename: PHART, PCO2, PO2, HCO3,  in the last 72 hours  Studies/Results: No results found.  Anti-infectives: Anti-infectives   Start     Dose/Rate Route Frequency Ordered Stop   03/22/14 1100  ceFAZolin (ANCEF) IVPB 2 g/50 mL premix     2 g 100 mL/hr over 30 Minutes Intravenous  Once 03/22/14 1032 03/22/14 1150      Assessment/Plan: s/p Procedure(s): EXPLORATORY LAPAROTOMY (N/A) Will add reglan to help with ileus Ambulate Will consult her regular medical doc  LOS: 5 days    TOTH III,Sharilynn Cassity S 03/24/2014

## 2014-03-25 ENCOUNTER — Other Ambulatory Visit: Payer: Self-pay

## 2014-03-25 ENCOUNTER — Inpatient Hospital Stay (HOSPITAL_COMMUNITY): Payer: Medicare PPO

## 2014-03-25 ENCOUNTER — Encounter (HOSPITAL_COMMUNITY): Admission: EM | Disposition: A | Discharge status: Home / Self Care | Payer: Medicare PPO | Source: Home / Self Care

## 2014-03-25 DIAGNOSIS — K56609 Unspecified intestinal obstruction, unspecified as to partial versus complete obstruction: Secondary | ICD-10-CM

## 2014-03-25 DIAGNOSIS — I219 Acute myocardial infarction, unspecified: Secondary | ICD-10-CM

## 2014-03-25 DIAGNOSIS — I5041 Acute combined systolic (congestive) and diastolic (congestive) heart failure: Secondary | ICD-10-CM | POA: Diagnosis present

## 2014-03-25 DIAGNOSIS — R000 Tachycardia, unspecified: Secondary | ICD-10-CM

## 2014-03-25 DIAGNOSIS — I369 Nonrheumatic tricuspid valve disorder, unspecified: Secondary | ICD-10-CM

## 2014-03-25 DIAGNOSIS — I420 Dilated cardiomyopathy: Secondary | ICD-10-CM | POA: Diagnosis present

## 2014-03-25 DIAGNOSIS — R0902 Hypoxemia: Secondary | ICD-10-CM

## 2014-03-25 DIAGNOSIS — J9600 Acute respiratory failure, unspecified whether with hypoxia or hypercapnia: Secondary | ICD-10-CM

## 2014-03-25 DIAGNOSIS — I2109 ST elevation (STEMI) myocardial infarction involving other coronary artery of anterior wall: Secondary | ICD-10-CM

## 2014-03-25 DIAGNOSIS — J9601 Acute respiratory failure with hypoxia: Secondary | ICD-10-CM

## 2014-03-25 DIAGNOSIS — I428 Other cardiomyopathies: Secondary | ICD-10-CM

## 2014-03-25 HISTORY — PX: LEFT HEART CATHETERIZATION WITH CORONARY ANGIOGRAM: SHX5451

## 2014-03-25 LAB — CBC
HEMATOCRIT: 41.3 % (ref 36.0–46.0)
Hemoglobin: 14.2 g/dL (ref 12.0–15.0)
MCH: 34.1 pg — ABNORMAL HIGH (ref 26.0–34.0)
MCHC: 34.4 g/dL (ref 30.0–36.0)
MCV: 99.3 fL (ref 78.0–100.0)
Platelets: 231 10*3/uL (ref 150–400)
RBC: 4.16 MIL/uL (ref 3.87–5.11)
RDW: 13 % (ref 11.5–15.5)
WBC: 15.9 10*3/uL — AB (ref 4.0–10.5)

## 2014-03-25 LAB — COMPREHENSIVE METABOLIC PANEL
ALK PHOS: 73 U/L (ref 39–117)
ALT: 22 U/L (ref 0–35)
AST: 29 U/L (ref 0–37)
Albumin: 3.2 g/dL — ABNORMAL LOW (ref 3.5–5.2)
BUN: 22 mg/dL (ref 6–23)
CO2: 18 mEq/L — ABNORMAL LOW (ref 19–32)
CREATININE: 0.47 mg/dL — AB (ref 0.50–1.10)
Calcium: 8.7 mg/dL (ref 8.4–10.5)
Chloride: 103 mEq/L (ref 96–112)
GFR calc non Af Amer: 90 mL/min — ABNORMAL LOW (ref >90)
GLUCOSE: 118 mg/dL — AB (ref 70–99)
Potassium: 4.1 mEq/L (ref 3.7–5.3)
Sodium: 141 mEq/L (ref 137–147)
TOTAL PROTEIN: 6.2 g/dL (ref 6.0–8.3)
Total Bilirubin: 0.9 mg/dL (ref 0.3–1.2)

## 2014-03-25 LAB — TROPONIN I: TROPONIN I: 0.33 ng/mL — AB (ref <0.30)

## 2014-03-25 LAB — POCT ACTIVATED CLOTTING TIME: Activated Clotting Time: 348 seconds

## 2014-03-25 LAB — PRO B NATRIURETIC PEPTIDE: Pro B Natriuretic peptide (BNP): 10991 pg/mL — ABNORMAL HIGH (ref 0–450)

## 2014-03-25 SURGERY — LEFT HEART CATHETERIZATION WITH CORONARY ANGIOGRAM
Anesthesia: LOCAL

## 2014-03-25 MED ORDER — VERAPAMIL HCL 2.5 MG/ML IV SOLN
INTRAVENOUS | Status: AC
  Filled 2014-03-25: qty 2

## 2014-03-25 MED ORDER — METOPROLOL TARTRATE 1 MG/ML IV SOLN
15.0000 mg | Freq: Once | INTRAVENOUS | Status: AC
  Administered 2014-03-25: 5 mg via INTRAVENOUS
  Filled 2014-03-25: qty 15

## 2014-03-25 MED ORDER — HYDRALAZINE HCL 25 MG PO TABS
25.0000 mg | ORAL_TABLET | Freq: Three times a day (TID) | ORAL | Status: DC
  Administered 2014-03-25 – 2014-03-28 (×8): 25 mg via ORAL
  Filled 2014-03-25 (×12): qty 1

## 2014-03-25 MED ORDER — HYDRALAZINE HCL 25 MG PO TABS
25.0000 mg | ORAL_TABLET | Freq: Three times a day (TID) | ORAL | Status: DC
  Filled 2014-03-25 (×2): qty 1

## 2014-03-25 MED ORDER — NITROGLYCERIN 2 % TD OINT
1.0000 [in_us] | TOPICAL_OINTMENT | Freq: Four times a day (QID) | TRANSDERMAL | Status: DC
  Administered 2014-03-25: 1 [in_us] via TOPICAL
  Filled 2014-03-25: qty 30

## 2014-03-25 MED ORDER — BIVALIRUDIN 250 MG IV SOLR
INTRAVENOUS | Status: AC
  Filled 2014-03-25: qty 250

## 2014-03-25 MED ORDER — HEPARIN SODIUM (PORCINE) 5000 UNIT/ML IJ SOLN
5000.0000 [IU] | Freq: Three times a day (TID) | INTRAMUSCULAR | Status: DC
  Administered 2014-03-25 – 2014-03-29 (×11): 5000 [IU] via SUBCUTANEOUS
  Filled 2014-03-25 (×15): qty 1

## 2014-03-25 MED ORDER — SODIUM CHLORIDE 0.9 % IV SOLN
INTRAVENOUS | Status: DC
  Administered 2014-03-25: 12:00:00 via INTRAVENOUS

## 2014-03-25 MED ORDER — NITROGLYCERIN 0.2 MG/ML ON CALL CATH LAB
INTRAVENOUS | Status: AC
  Filled 2014-03-25: qty 1

## 2014-03-25 MED ORDER — NITROGLYCERIN IN D5W 200-5 MCG/ML-% IV SOLN
2.0000 ug/min | INTRAVENOUS | Status: DC
  Administered 2014-03-25: 10 ug/min via INTRAVENOUS

## 2014-03-25 MED ORDER — FUROSEMIDE 10 MG/ML IJ SOLN
INTRAMUSCULAR | Status: AC
  Administered 2014-03-25: 40 mg
  Filled 2014-03-25: qty 4

## 2014-03-25 MED ORDER — HEPARIN (PORCINE) IN NACL 100-0.45 UNIT/ML-% IJ SOLN
650.0000 [IU]/h | INTRAMUSCULAR | Status: DC
  Administered 2014-03-25: 650 [IU]/h via INTRAVENOUS
  Filled 2014-03-25: qty 250

## 2014-03-25 MED ORDER — NITROGLYCERIN 0.4 MG SL SUBL
SUBLINGUAL_TABLET | SUBLINGUAL | Status: AC
  Filled 2014-03-25: qty 1

## 2014-03-25 MED ORDER — HYDRALAZINE HCL 20 MG/ML IJ SOLN
10.0000 mg | Freq: Once | INTRAMUSCULAR | Status: AC
  Administered 2014-03-25: 10 mg via INTRAVENOUS
  Filled 2014-03-25: qty 1

## 2014-03-25 MED ORDER — HEPARIN (PORCINE) IN NACL 2-0.9 UNIT/ML-% IJ SOLN
INTRAMUSCULAR | Status: AC
  Filled 2014-03-25: qty 1500

## 2014-03-25 MED ORDER — METOPROLOL TARTRATE 1 MG/ML IV SOLN
5.0000 mg | Freq: Four times a day (QID) | INTRAVENOUS | Status: DC | PRN
  Administered 2014-03-25 (×2): 5 mg via INTRAVENOUS
  Filled 2014-03-25 (×2): qty 5

## 2014-03-25 MED ORDER — ASPIRIN 81 MG PO CHEW
324.0000 mg | CHEWABLE_TABLET | Freq: Once | ORAL | Status: AC
  Administered 2014-03-25: 324 mg via ORAL
  Filled 2014-03-25: qty 4

## 2014-03-25 MED ORDER — LIDOCAINE HCL (PF) 1 % IJ SOLN
INTRAMUSCULAR | Status: AC
  Filled 2014-03-25: qty 30

## 2014-03-25 MED ORDER — NITROGLYCERIN 0.4 MG SL SUBL
0.4000 mg | SUBLINGUAL_TABLET | SUBLINGUAL | Status: DC | PRN
  Administered 2014-03-25: 0.4 mg via SUBLINGUAL

## 2014-03-25 MED FILL — Nitroglycerin IV Soln 200 MCG/ML in D5W: INTRAVENOUS | Qty: 250 | Status: AC

## 2014-03-25 NOTE — Progress Notes (Signed)
  Echocardiogram 2D Echocardiogram (STAT) has been performed. Dr. Marlou Porch was present throughout the echocardiogram.  Srihith Aquilino, [facility] 03/25/2014, 9:15 AM

## 2014-03-25 NOTE — Progress Notes (Signed)
3 Days Post-Op  Subjective: Was called to the patients room after a rapid response was initiated (by nursing) secondary to the nurse finding the patient SOB with oxygen desaturations.  She was tachycardic and tachypnic and appeared to be in respiratory distress.  EKG obtained which shows in determinant anteroseptal MI with ST elevations.  No EKG in system to compare to.  Objective: Vital signs in last 24 hours: Temp:  [97.8 F (36.6 C)-100.2 F (37.9 C)] 100.2 F (37.9 C) (03/26 0848) Pulse Rate:  [62-118] 90 (03/26 0848) Resp:  [16-33] 25 (03/26 0848) BP: (156-187)/(80-102) 181/96 mmHg (03/26 0848) SpO2:  [90 %-99 %] 96 % (03/26 0848) Weight:  [115 lb (52.164 kg)-115 lb 11.9 oz (52.5 kg)] 115 lb (52.164 kg) (03/26 0830) Last BM Date: 03/21/14  Intake/Output from previous day: 03/25 0701 - 03/26 0700 In: 2585 [P.O.:30; I.V.:2405; IV Piggyback:150] Out: 2300 [Urine:1700; Emesis/NG output:600] Intake/Output this shift: Total I/O In: 10 [I.V.:10] Out: -   PE: Gen:  Alert, moderate distress, answering questions Card:  tachycardic, no M/G/R heard, significant JVD noted Pulm:  Tachypnic, Crackles b/l, no W/R/R  Abd: Soft, mildly tender, +BS, no HSM, incisions C/D/I Ext:  No erythema, edema, or ecchymosis Neuro/psych:  A&O x 3, anxious appearing, moving all extremities CSM intact b/l, no neuro deficits noted   Lab Results:   Recent Labs  03/25/14 0815  WBC 15.9*  HGB 14.2  HCT 41.3  PLT 231   BMET  Recent Labs  03/23/14 0451 03/25/14 0815  NA 139 141  K 3.9 4.1  CL 102 103  CO2 24 18*  GLUCOSE 107* 118*  BUN 19 22  CREATININE 0.49* 0.47*  CALCIUM 8.2* 8.7   PT/INR No results found for this basename: LABPROT, INR,  in the last 72 hours CMP     Component Value Date/Time   NA 141 03/25/2014 0815   K 4.1 03/25/2014 0815   CL 103 03/25/2014 0815   CO2 18* 03/25/2014 0815   GLUCOSE 118* 03/25/2014 0815   BUN 22 03/25/2014 0815   CREATININE 0.47* 03/25/2014 0815   CALCIUM 8.7 03/25/2014 0815   PROT 6.2 03/25/2014 0815   ALBUMIN 3.2* 03/25/2014 0815   AST 29 03/25/2014 0815   ALT 22 03/25/2014 0815   ALKPHOS 73 03/25/2014 0815   BILITOT 0.9 03/25/2014 0815   GFRNONAA 90* 03/25/2014 0815   GFRAA >90 03/25/2014 0815   Lipase     Component Value Date/Time   LIPASE 22 03/19/2014 0925       Studies/Results: Dg Chest Port 1 View  03/25/2014   CLINICAL DATA:  Increasing shortness of Breath  EXAM: PORTABLE CHEST - 1 VIEW  COMPARISON:  03/19/2014  FINDINGS: Cardiac shadow is stable. A nasogastric catheter is noted within the stomach. Patchy infiltrative changes are noted throughout both lungs with superimposed interstitial change likely representing some CHF.  IMPRESSION: Patchy infiltrates bilaterally with superimposed interstitial edema.   Electronically Signed   By: Inez Catalina M.D.   On: 03/25/2014 08:09   Dg Abd Portable 1v  03/25/2014   CLINICAL DATA:  Small bowel obstruction  EXAM: PORTABLE ABDOMEN - 1 VIEW  COMPARISON:  DG ABDOMEN 1V dated 03/22/2014  FINDINGS: Stable NG tube. Postoperative changes stable. Distended small bowel loops improved. Colonic gas increased. No obvious free intraperitoneal gas.  IMPRESSION: Improved partial small bowel obstruction pattern.   Electronically Signed   By: Maryclare Bean M.D.   On: 03/25/2014 08:24    Anti-infectives: Anti-infectives  Start     Dose/Rate Route Frequency Ordered Stop   03/22/14 1100  ceFAZolin (ANCEF) IVPB 2 g/50 mL premix     2 g 100 mL/hr over 30 Minutes Intravenous  Once 03/22/14 1032 03/22/14 1150       Assessment/Plan POD #3 s/p Ex lap with LOA for adhesive band Post-operate ileus Hypoxia ? STEMI, PE HTN Tachycardia JVD  Plan: 1.  Rapid response initiated by nursing.  Obtained a stat EKG, KUB and CXR, drew stat labs including troponin's, CMET, and CBC.  Gave lopressor, started therapeutic heparin, gave 324mg  chewable aspirin 2.  Called cardiology and CCM for consultation 3.  Was  transferred to the ICU 4.  Will be transferred to [facility], will let the hospital team at [facility] know about transfer 5.  Apparently her PCP saw her last night, but there is no note. 6.  Continue NPO, and NG tube for now, CCM ordering lasix and 2d echo  60 minutes of critical care time   LOS: 6 days    DORT, Jaquasha Carnevale 03/25/2014, 9:02 AM Pager: [phone]

## 2014-03-25 NOTE — Progress Notes (Signed)
3 Days Post-Op  Subjective: Rapid response called for SOB, she was seen and EKG/PCXR ordered.  She did not complain of chest pain initially.   EKG shows some changes we did not have a base line.  She was SOB and desaturating.  CXR shows some interstitial edema.  Her BP was up and she was tachycardic.  She was transfered directly to the ICU.  We gave her 4 Asprin 81mg  on the floor.  In the ICU she got 5 mg of IV lopressor, 40 mg of IV lasix, a foley was inserted, IV down to [facility]. SL nitro did not change her symptoms.  We added some nitro paste while she was here in the unit.  She had some chest discomfort that she called a funny rhythm that came with her SOB.  CCM AND Cardiology was contacted and EKG was faxed.  Stat 2D echo shows significant anterior wall dysfunction.  Labs show elevated WBC with BNP 10991.  Trop have been requested but not completed yet..  She has been seen by Dr. Marlou Porch from cardiology, and after reviewing echo, they plan to transfer to [facility] and the cath lab as a Code STEMI.  Objective: Vital signs in last 24 hours: Temp:  [97.8 F (36.6 C)-100.2 F (37.9 C)] 100.2 F (37.9 C) (03/26 0848) Pulse Rate:  [62-118] 90 (03/26 0848) Resp:  [16-33] 25 (03/26 0848) BP: (156-187)/(80-102) 181/96 mmHg (03/26 0848) SpO2:  [90 %-99 %] 96 % (03/26 0848) Weight:  [52.164 kg (115 lb)-52.5 kg (115 lb 11.9 oz)] 52.164 kg (115 lb) (03/26 0830) Last BM Date: 03/21/14  Intake/Output from previous day: 03/25 0701 - 03/26 0700 In: 2585 [P.O.:30; I.V.:2405; IV Piggyback:150] Out: 2300 [Urine:1700; Emesis/NG output:600] Intake/Output this shift: Total I/O In: 10 [I.V.:10] Out: -   General appearance: alert, cooperative, mild distress and SOB, on FM.  She does have JVD. Resp: bilateral rales Chest wall: no tenderness, no complaints of chest pain initially Extremities: no real edema  Lab Results:   Recent Labs  03/25/14 0815  WBC 15.9*  HGB 14.2  HCT 41.3  PLT 231     BMET  Recent Labs  03/23/14 0451 03/25/14 0815  NA 139 141  K 3.9 4.1  CL 102 103  CO2 24 18*  GLUCOSE 107* 118*  BUN 19 22  CREATININE 0.49* 0.47*  CALCIUM 8.2* 8.7   PT/INR No results found for this basename: LABPROT, INR,  in the last 72 hours   Recent Labs Lab 03/19/14 0925 03/21/14 0531 03/25/14 0815  AST 34 23 29  ALT 20 13 22   ALKPHOS 85 63 73  BILITOT 0.8 1.0 0.9  PROT 7.6 6.2 6.2  ALBUMIN 4.8 3.4* 3.2*     Lipase     Component Value Date/Time   LIPASE 22 03/19/2014 0925     Studies/Results: Dg Chest Port 1 View  03/25/2014   CLINICAL DATA:  Increasing shortness of Breath  EXAM: PORTABLE CHEST - 1 VIEW  COMPARISON:  03/19/2014  FINDINGS: Cardiac shadow is stable. A nasogastric catheter is noted within the stomach. Patchy infiltrative changes are noted throughout both lungs with superimposed interstitial change likely representing some CHF.  IMPRESSION: Patchy infiltrates bilaterally with superimposed interstitial edema.   Electronically Signed   By: Inez Catalina M.D.   On: 03/25/2014 08:09   Dg Abd Portable 1v  03/25/2014   CLINICAL DATA:  Small bowel obstruction  EXAM: PORTABLE ABDOMEN - 1 VIEW  COMPARISON:  DG ABDOMEN 1V dated 03/22/2014  FINDINGS: Stable NG tube. Postoperative changes stable. Distended small bowel loops improved. Colonic gas increased. No obvious free intraperitoneal gas.  IMPRESSION: Improved partial small bowel obstruction pattern.   Electronically Signed   By: Maryclare Bean M.D.   On: 03/25/2014 08:24    Medications: . amLODipine  5 mg Oral Daily  . chlorhexidine  15 mL Mouth/Throat BID  . famotidine (PEPCID) IV  20 mg Intravenous Q12H  . metoCLOPramide (REGLAN) injection  5 mg Intravenous 3 times per day  . nitroGLYCERIN  1 inch Topical 4 times per day  . nitroGLYCERIN        Assessment/Plan ACUTE SOB, POSSIBLE MI, PULMONARY EDEMA 1. SBO with hx of total abdominal hysterectomy bilateral SOO 1994  S/p exploratory laparotomy with  lysis of adhesions on 03/22/14 Dr. Marlou Starks 2. Hypertension  3. Hx of right breast cancer  4. Blepharitis  5. Dyslipidemia  6. Osteoporosis  7. Arthritis    PLAN:  PT has been seen and tranferred to  [facility] and the cath lab.  I will alert our group at [facility] and continue to follow.  LOS: 6 days    Marcelina Mclaurin 03/25/2014

## 2014-03-25 NOTE — Progress Notes (Signed)
ANTICOAGULATION CONSULT NOTE - Initial Consult  Pharmacy Consult for heparin Indication: chest pain/ACS  No Known Allergies  Patient Measurements: Height: 5' (152.4 cm) Weight: 115 lb 11.9 oz (52.5 kg) IBW/kg (Calculated) : 45.5 Heparin Dosing Weight: 52.5kg  Vital Signs: Temp: 97.8 F (36.6 C) (03/26 0602) Temp src: Oral (03/26 0602) BP: 172/94 mmHg (03/26 0750) Pulse Rate: 118 (03/26 0750)  Labs:  Recent Labs  03/23/14 0451  CREATININE 0.49*    Estimated Creatinine Clearance: 38.9 ml/min (by C-G formula based on Cr of 0.49).   Medical History: Past Medical History  Diagnosis Date  . Breast cancer 1992    right breast   . Hypertension   . Blepharitis   . Osteoporosis   . Ectopic pregnancy   . Colon polyp     Assessment: 8 YOF s/p ex lap and LOA for SBO from adhesive band.  On morning of POD2 her oxygen sats dropped and EKG performed (? Acute anterior MI) and pharmacy asked to start heparin STAT for suspected ACS.  CBC 3/23: Hgb WNL, platelets = 146   Goal of Therapy:  Heparin level 0.3-0.7 units/ml Monitor platelets by anticoagulation protocol: Yes   Plan:   Heparin 650 units/hr (NO bolus as given 5000 units SQ 6am)  8 hr heparin level  Daily heparin level and CBC  Doreene Eland, PharmD, BCPS.   Pager: [phone]  03/25/2014,8:10 AM

## 2014-03-25 NOTE — Consult Note (Addendum)
PULMONARY / CRITICAL CARE MEDICINE   Name: Kimberly Jordan MRN: [id number] DOB: 1929/10/21    ADMISSION DATE:  03/19/2014 CONSULTATION DATE: 3/26  REFERRING MD :  CCS PRIMARY SERVICE: CCS  CHIEF COMPLAINT:  SOB  BRIEF PATIENT DESCRIPTION:  80 yo former [place] who underwent exp lap for lysis of adhesions 3/23 and was progressing well. She developed acute sob 3/26 730 am, HT< sbp 180's, hypoxia (sats 80% on 5 l [place]) and rapid response was activated. She presented to ICU awake and alert. No complaints of pain, sats 98% on 70% NRB, sbp 119m st 112 but jvd mandible and anterior lateral EKG changes with no prexios12 lead to compare. Given asa and IV lopressor, heprin, lasix and cardiology consulted. PCCM asked to consult while in ICU.  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 She developed acute sob 3/26 730 am, HT< sbp 180's, hypoxia (sats 80% on 5 l [place]) and rapid response was activated. She presented to ICU awake and alert. No complaints of pain, sats 98% on 70% NRB, sbp 154m st 112 but jvd mandible and anterior lateral EKG changes with no prexios12 lead to compare. Given asa and IV lopressor, heprin, lasix and cardiology consulted. PCCM asked to consult while in ICU. PAST MEDICAL HISTORY :  Past Medical History  Diagnosis Date  . Breast cancer 1992    right breast   . Hypertension   . Blepharitis   . Osteoporosis   . Ectopic pregnancy   . Colon polyp    Past Surgical History  Procedure Laterality Date  . Ectopic pregnancy surgery      salpingectomy  . Colonoscopy w/ polypectomy    . Hand surgery  2007    right hand-ruptured tendon  . Cataract extraction  2010, 2011    2010 right eye, 2011 left eye  . Cyst removed  2012    from right wrist  . Total abdominal  hysterectomy w/ bilateral salpingoophorectomy  1994  . Knee arthroscopy  1998  . Laparotomy N/A 03/22/2014    Procedure: EXPLORATORY LAPAROTOMY;  Surgeon: Merrie Roof, MD;  Location: [facility] ORS;  Service: General;  Laterality: N/A;   Prior to Admission medications   Medication Sig Start Date End Date Taking? Authorizing Provider  amLODipine (NORVASC) 5 MG tablet Take 5 mg by mouth daily.   Yes Historical Provider, MD  aspirin 81 MG tablet Take 81 mg by mouth daily.   Yes Historical Provider, MD  Calcium Carbonate-Vitamin D (CALCIUM + D PO) Take 600 mg by mouth 3 (three) times daily.   Yes Historical Provider, MD  clobetasol cream (TEMOVATE) 4.08 % Apply 1 application topically as needed (For itching on back).  01/11/14  Yes Historical Provider, MD  doxycycline (VIBRAMYCIN) 100 MG capsule Take 100 mg by mouth daily. 12/22/13  Yes Historical Provider, MD  FORTEO 600 MCG/2.4ML SOLN Inject 20 mcg into the skin daily.  01/08/14  Yes Historical Provider, MD  Polyvinyl Alcohol-Povidone (CLEAR EYES NATURAL TEARS) 5-6 MG/ML SOLN Place 2 drops into both eyes daily as needed.   Yes Historical Provider, MD  potassium chloride (K-DUR) 10 MEQ tablet Take 20 mEq by mouth daily.    Yes Historical Provider, MD  Propylene Glycol (SYSTANE BALANCE) 0.6 % SOLN Place 1  drop into both eyes daily as needed.   Yes Historical Provider, MD  simvastatin (ZOCOR) 20 MG tablet Take 20 mg by mouth daily.   Yes Historical Provider, MD  telmisartan-hydrochlorothiazide (MICARDIS HCT) 80-12.5 MG per tablet Take 1 tablet by mouth daily.   Yes Historical Provider, MD   No Known Allergies  FAMILY HISTORY:  Family History  Problem Relation Age of Onset  . Osteoporosis Mother   . CVA Father   . Hypertension Father   . Arthritis Mother   . Hypertension Mother    SOCIAL HISTORY:  reports that she has quit smoking. She has never used smokeless tobacco. She reports that she drinks about 3.0 ounces of alcohol per week. She reports  that she does not use illicit drugs.  REVIEW OF SYSTEMS:   10 point review of system taken, please see HPI for positives and negatives.]   SUBJECTIVE:   VITAL SIGNS: Temp:  [97.8 F (36.6 C)-98.7 F (37.1 C)] 97.8 F (36.6 C) (03/26 0602) Pulse Rate:  [62-118] 118 (03/26 0750) Resp:  [16-18] 18 (03/26 0602) BP: (156-172)/(80-94) 172/94 mmHg (03/26 0750) SpO2:  [90 %-98 %] 98 % (03/26 0750) Weight:  [52.5 kg (115 lb 11.9 oz)] 52.5 kg (115 lb 11.9 oz) (03/26 0218) HEMODYNAMICS:   VENTILATOR SETTINGS:   INTAKE / OUTPUT: Intake/Output     03/25 0701 - 03/26 0700 03/26 0701 - 03/27 0700   P.O. 30    I.V. (mL/kg) 2405 (45.8)    IV Piggyback 150    Total Intake(mL/kg) 2585 (49.2)    Urine (mL/kg/hr) 1700 (1.3)    Emesis/NG output 600 (0.5)    Total Output 2300     Net +285            PHYSICAL EXAMINATION: General:  wnwdwf in moderate distress Neuro:  Intact HEENT:  +++JVD, no lan Cardiovascular:  HSR RRR ST No murmur Lungs:  Bibasilar crackles Abdomen:  Tender, suture line intact Musculoskeletal:  intact Skin:  Lower ext cool. Slightly clammy.  LABS:  CBC  Recent Labs Lab 03/20/14 0503 03/21/14 0531 03/22/14 0456  WBC 16.3* 11.1* 10.7*  HGB 14.5 13.6 13.0  HCT 41.8 39.6 38.6  PLT 171 167 146*   Coag's  Recent Labs Lab 03/20/14 0503  APTT 35  INR 1.12   BMET  Recent Labs Lab 03/21/14 0531 03/22/14 0456 03/23/14 0451  NA 138 136* 139  K 3.1* 3.2* 3.9  CL 97 98 102  CO2 28 28 24   BUN 13 14 19   CREATININE 0.43* 0.45* 0.49*  GLUCOSE 155* 149* 107*   Electrolytes  Recent Labs Lab 03/19/14 0925  03/21/14 0531 03/22/14 0456 03/23/14 0451  CALCIUM 10.7*  < > 9.1 8.8 8.2*  MG 1.4*  --   --  1.7 1.5  < > = values in this interval not displayed. Sepsis Markers No results found for this basename: LATICACIDVEN, PROCALCITON, O2SATVEN,  in the last 168 hours ABG No results found for this basename: PHART, PCO2ART, PO2ART,  in the last 168  hours Liver Enzymes  Recent Labs Lab 03/19/14 0925 03/21/14 0531  AST 34 23  ALT 20 13  ALKPHOS 85 63  BILITOT 0.8 1.0  ALBUMIN 4.8 3.4*   Cardiac Enzymes No results found for this basename: TROPONINI, PROBNP,  in the last 168 hours Glucose  Recent Labs Lab 03/22/14 1003  GLUCAP 145*    Imaging Dg Chest Port 1 View  03/25/2014   CLINICAL DATA:  Increasing shortness of  Breath  EXAM: PORTABLE CHEST - 1 VIEW  COMPARISON:  03/19/2014  FINDINGS: Cardiac shadow is stable. A nasogastric catheter is noted within the stomach. Patchy infiltrative changes are noted throughout both lungs with superimposed interstitial change likely representing some CHF.  IMPRESSION: Patchy infiltrates bilaterally with superimposed interstitial edema.   Electronically Signed   By: Inez Catalina M.D.   On: 03/25/2014 08:09       ASSESSMENT / PLAN:  PULMONARY A:Hypoxia most likely related to cardiac issue, ACUTE RESP FAILURE     Cannot rule pe BUT WILL BE ON HEPARIN P:   O2 as needed Intubate if needed Further PE evaluation may be considered in future.  CARDIOVASCULAR A: HTN     12 lead changes     JVD     2d with hypokinesis      ?STEMI P:  Cards consult Repeat 12 lead Cardiac enzymes 2 d echo Diuresis Possible transfer to [facility] for LHC and intervention.  RENAL A: No acute issue P:     GASTROINTESTINAL A:  Plap 3/23 for sbo P:   Per CCS  HEMATOLOGIC A:  Started on heparin drip 3 days post op P:  Monitor for bleeding  INFECTIOUS A:  No acute issue P:     ENDOCRINE A:  No acute issue  P:     NEUROLOGIC A:  Intact P:      Steve Minor ACNP Maryanna Shape PCCM Pager [phone] till 3 pm If no answer page [phone] 03/25/2014, 8:32 AM   STAFF NOTE: I, Dr Ann Lions have personally reviewed patient's available data, including medical history, events of note, physical examination and test results as part of my evaluation. I have discussed with resident/NP and other care  providers such as pharmacist, RN and RRT.  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 She developed acute sob 3/26 730 am, HT< sbp 180's, hypoxia (sats 80% on 5 l [place]) and rapid response was activated. She presented to ICU awake and alert. No complaints of pain, sats 98% on 70% NRB, sbp 133m st 112 but jvd mandible and anterior lateral EKG changes with no prexios12 lead to compare. Given asa and IV lopressor, heprin, lasix and cardiology consulted. D/w Dr Marlou Porch - patient will go to cath lab [facility] and then [facility].   Rest per NP/medical resident whose note is outlined above and that I agree with  The patient is critically ill with multiple organ systems failure and requires high complexity decision making for assessment and support, frequent evaluation and titration of therapies, application of advanced monitoring technologies and extensive interpretation of multiple databases.   Critical Care Time devoted to patient care services described in this note is  31  Minutes.  Dr. Brand Males, M.D., [facility].C.P Pulmonary and Critical Care Medicine Staff Physician [place] [facility] Pager: [phone], If no answer or between  15:00h - 7:00h: call [phone]  03/25/2014 9:25 AM

## 2014-03-25 NOTE — Progress Notes (Signed)
Pillager Progress Note Patient Name: Kimberly Jordan DOB: 14-Aug-1929 MRN: [id number]  Date of Service  03/25/2014   HPI/Events of Note   Pt seen post cath in CCU  eICU Interventions  Pt improved. No CCM interventions needed.  Pt on [facility] svc but cardiology issues, CCM was consulted at [facility] before transfer Need to sort our primary svc.  PCCM MD will see again 3/27   Intervention Category Major Interventions: Respiratory failure - evaluation and management  Asencion Noble 03/25/2014, 9:35 PM

## 2014-03-25 NOTE — Progress Notes (Signed)
Pt brought to rm 1229 from 5th floor with c/o SOB and chest pressure.  Will Creig Hines PA, Richardson Landry Minor ACNP, Dr Carolyne Littles present.  Pt was given Lasix 40mg , Lopressor 10 mg IV total given, 325mg  chew ASA ,started on Heparin drip at 650u, 1" of NTG paste applied to left arm. Also received 1 NTG SL.  Stat 2D echo done, Dr Carolyne Littles talked to family and will take to [facility] for further interventions.  Husband at beside and explained pt's condition and treamtent to be done. BP 150/83 HR 87  Resp 26 Sats 96-100% .

## 2014-03-25 NOTE — Progress Notes (Signed)
She is feeling much better. She is now off of face mask oxygen. She has put out approximately 2 L of fluid. IV Lasix. She still continues to have blood pressures in the 400 systolic range. I'm hesitant to utilize ACE inhibitor given her recent administration of contrast agents. In lieu of this, I will give her hydralazine 10 mg IV x1 and hydralazine 25 mg by mouth 3 times a day.  Tomorrow, if her creatinine is stable, likely begin ACE inhibitor for further afterload reduction. She may also be able to tolerate metoprolol extended release at that time as well.

## 2014-03-25 NOTE — Progress Notes (Signed)
Pt c/o SOB. Obtained VS. 160/88, P 118, O2 sats 83 on 2 liters oxygen. Increased oxygen to 5 liters, O2 level increased to 85%. Notified rapid response nurse and MD. Pt eventually transferred to ICU.

## 2014-03-25 NOTE — Consult Note (Addendum)
Admit date: 03/19/2014 Referring Physician  Dr. Marlou Starks Primary Physician Tivis Ringer, MD Primary Cardiologist  none Reason for Consultation  abnormal EKG/acute shortness of breath, concern for MI  HPI: 80 year-old female nondiabetic with no prior cardiovascular history who on Monday 03/22/2014 underwent lysis of adhesions, open vertical incision who earlier this morning at a proximally 7:30 8-1/2 acute shortness of breath and rapid response was called. She was given 40 mg of IV Lasix, 5 mg of IV Lopressor, nitro paste and EKG performed demonstrated J-point elevation in the anterior leads of approximately 3 mm with ST segment depression in V6 laterally. Face mask oxygen was utilized. I was called to evaluate her and simultaneously had echocardiogram performed which demonstrated ejection fraction of approximately 15% with anteroseptal wall akinesis, mild wall motion of inferolateral wall. Repeat EKG demonstrated slightly less J-point elevated ST segment elevation. She's not having any chest pain she is merely short of breath which may be slightly improved with current measures. Her blood pressure is 956 systolic. Will with surgical services is at bedside as well.      PMH:   Past Medical History  Diagnosis Date  . Breast cancer 1992    right breast   . Hypertension   . Blepharitis   . Osteoporosis   . Ectopic pregnancy   . Colon polyp     PSH:   Past Surgical History  Procedure Laterality Date  . Ectopic pregnancy surgery      salpingectomy  . Colonoscopy w/ polypectomy    . Hand surgery  2007    right hand-ruptured tendon  . Cataract extraction  2010, 2011    2010 right eye, 2011 left eye  . Cyst removed  2012    from right wrist  . Total abdominal hysterectomy w/ bilateral salpingoophorectomy  1994  . Knee arthroscopy  1998  . Laparotomy N/A 03/22/2014    Procedure: EXPLORATORY LAPAROTOMY;  Surgeon: Merrie Roof, MD;  Location: [facility];  Service: General;  Laterality:  N/A;   Allergies:  Review of patient's allergies indicates no known allergies. Prior to Admit Meds:   Prior to Admission medications   Medication Sig Start Date End Date Taking? Authorizing Provider  amLODipine (NORVASC) 5 MG tablet Take 5 mg by mouth daily.   Yes Historical Provider, MD  aspirin 81 MG tablet Take 81 mg by mouth daily.   Yes Historical Provider, MD  Calcium Carbonate-Vitamin D (CALCIUM + D PO) Take 600 mg by mouth 3 (three) times daily.   Yes Historical Provider, MD  clobetasol cream (TEMOVATE) 3.87 % Apply 1 application topically as needed (For itching on back).  01/11/14  Yes Historical Provider, MD  doxycycline (VIBRAMYCIN) 100 MG capsule Take 100 mg by mouth daily. 12/22/13  Yes Historical Provider, MD  FORTEO 600 MCG/2.4ML SOLN Inject 20 mcg into the skin daily.  01/08/14  Yes Historical Provider, MD  Polyvinyl Alcohol-Povidone (CLEAR EYES NATURAL TEARS) 5-6 MG/ML SOLN Place 2 drops into both eyes daily as needed.   Yes Historical Provider, MD  potassium chloride (K-DUR) 10 MEQ tablet Take 20 mEq by mouth daily.    Yes Historical Provider, MD  Propylene Glycol (SYSTANE BALANCE) 0.6 % SOLN Place 1 drop into both eyes daily as needed.   Yes Historical Provider, MD  simvastatin (ZOCOR) 20 MG tablet Take 20 mg by mouth daily.   Yes Historical Provider, MD  telmisartan-hydrochlorothiazide (MICARDIS HCT) 80-12.5 MG per tablet Take 1 tablet by mouth daily.  Yes Historical Provider, MD   Fam HX:    Family History  Problem Relation Age of Onset  . Osteoporosis Mother   . CVA Father   . Hypertension Father   . Arthritis Mother   . Hypertension Mother    Social HX:    History   Social History  . Marital Status: Married    Spouse Name: N/A    Number of Children: N/A  . Years of Education: N/A   Occupational History  . Not on file.   Social History Main Topics  . Smoking status: Former Research scientist (life sciences)  . Smokeless tobacco: Never Used  . Alcohol Use: 3.0 oz/week    5 Glasses  of wine per week     Comment: wine daily  . Drug Use: No  . Sexual Activity: No     Comment: TAH/BSO   Other Topics Concern  . Not on file   Social History Narrative  . No narrative on file     ROS:  All 11 ROS were addressed and are negative except what is stated in the HPI   Physical Exam: Blood pressure 181/96, pulse 90, temperature 100.2 F (37.9 C), temperature source Oral, resp. rate 25, height 5' (1.524 m), weight 115 lb (52.164 kg), last menstrual period 12/31/1992, SpO2 96.00%.   General: Elderly, thin, moderate respiratory acute distress Head: Eyes PERRLA, No xanthomas.   Normal cephalic and atramatic  Lungs:   Clear bilaterally to auscultation and percussion. Normal respiratory effort. No wheezes, no rales. Heart:   HRRR S1 S2 positive S3 Pulses are 2+ & equal. No murmur, rubs, gallops.  No carotid bruit. Positive JVD earlobe.  No abdominal bruits.  Abdomen: Minimal bowel sounds, is noted. Vertical incision noted. Intact. Msk:  Back normal. Normal strength and tone for age. Extremities:  No clubbing, cyanosis or edema.  DP +1 Neuro: Alert and oriented X 3, non-focal, MAE x 4 GU: Deferred Rectal: Deferred Psych:  Good affect, responds appropriately      Labs: Lab Results  Component Value Date   WBC 15.9* 03/25/2014   HGB 14.2 03/25/2014   HCT 41.3 03/25/2014   MCV 99.3 03/25/2014   PLT 231 03/25/2014     Recent Labs Lab 03/25/14 0815  NA 141  K 4.1  CL 103  CO2 18*  BUN 22  CREATININE 0.47*  CALCIUM 8.7  PROT 6.2  BILITOT 0.9  ALKPHOS 73  ALT 22  AST 29  GLUCOSE 118*     Radiology:  Dg Chest Port 1 View  03/25/2014   CLINICAL DATA:  Increasing shortness of Breath  EXAM: PORTABLE CHEST - 1 VIEW  COMPARISON:  03/19/2014  FINDINGS: Cardiac shadow is stable. A nasogastric catheter is noted within the stomach. Patchy infiltrative changes are noted throughout both lungs with superimposed interstitial change likely representing some CHF.  IMPRESSION:  Patchy infiltrates bilaterally with superimposed interstitial edema.   Electronically Signed   By: Inez Catalina M.D.   On: 03/25/2014 08:09   Dg Abd Portable 1v  03/25/2014   CLINICAL DATA:  Small bowel obstruction  EXAM: PORTABLE ABDOMEN - 1 VIEW  COMPARISON:  DG ABDOMEN 1V dated 03/22/2014  FINDINGS: Stable NG tube. Postoperative changes stable. Distended small bowel loops improved. Colonic gas increased. No obvious free intraperitoneal gas.  IMPRESSION: Improved partial small bowel obstruction pattern.   Electronically Signed   By: Maryclare Bean M.D.   On: 03/25/2014 08:24   Personally viewed.  EKG:  Sinus rhythm, borderline interventricular  conduction delay, J-point elevation 3-4 mm anterior leads with T-wave peaking. Repeat EKG shows decreased J-point elevation. Lateral T wave inversion noted. Mild. Personally viewed.   Echocardiogram: 03/25/14-ejection fraction appears to be approximately 15% with global hypokinesis except for mild contraction of the inferoseptal/inferolateral wall. The anteroseptal wall appears to be akinetic. Mild mitral regurgitation noted. The right ventricle seems to be functioning well. Full report to follow.  ASSESSMENT/PLAN:   80 year old female postoperative from 03/22/14 for lysis of adhesions who developed acute shortness of breath at approximate 7:30 AM, rapid response, EKG concerning for ST segment elevation myocardial infarction anterior, echocardiogram demonstrating acute systolic heart failure EF of approximately 15% with anteroseptal wall akinesis, mild inferolateral/inferoseptal contractility.  1. Anterior wall ST elevation myocardial infarction-discussed with she and her husband. Discussed with interventional team. We will take her to [facility] for emergent cardiac catheterization. Blood pressure currently in the 170 range. No evidence of cardiogenic shock. No chest pain currently. Merely short of breath. Chest x-ray consistent with pulmonary edema. Discussed  with surgery team. Heparin IV okay. Dual antiplatelet therapy may be used if necessary. Her surgery was on Monday , currently Thursday. Of course, watch for any signs of bleeding and she is aware that this is a risk. Code STEMI has been activated.  The risks and benefits of cardiac catheterization including stroke, further myocardial infarction, respiratory failure, death, bleeding, renal impairment have been explained to patient. She is willing to proceed with transportation. I discussed case with interventional team and Dr. Irish Lack.  2. Acute systolic heart failure/cardiomyopathy-newly diagnosed. Continue with further afterload reduction, IV Lasix, beta blocker as tolerated once we remove fluid. Cardiac catheterization to exclude ischemic source.  3. Abnormal EKG-appreciate the assistance of team to find old EKG of Dr. Dagmar Hait. Current EKG definitely shows changes.  4. Small bowel obstruction-per surgical team. Monitor for any signs of bleeding. We will continue to follow her in CCU. Dr. Marlou Starks his current attending.  Critical care time one hour spent with patient, family, nursing, other physicians, coordination of care in the setting of critically ill patient, data review.    Candee Furbish, MD  03/25/2014  9:05 AM

## 2014-03-25 NOTE — CV Procedure (Signed)
       PROCEDURE:  Left heart catheterization with selective coronary angiography, left ventriculogram.  INDICATIONS:  Possible STEMI  The risks, benefits, and details of the procedure were explained to the patient.  The patient verbalized understanding and wanted to proceed.  Informed written consent was obtained.  PROCEDURE TECHNIQUE:  After Xylocaine anesthesia a 37F slender sheath was placed in the left radial artery with a single anterior needle wall stick.  Heparin had already been given. Right coronary angiography was done using a Judkins R4 guide catheter.  Left coronary angiography was done using a XB LAD guide catheter.  Left ventriculography was done using a pigtail catheter.  Angiomax to be given because intervention was anticipated. A TR band was used for hemostasis.   CONTRAST:  Total of 65 cc.  COMPLICATIONS:  None.    HEMODYNAMICS:  Aortic pressure was 161/77; LV pressure was 164/15; LVEDP 34.  There was no gradient between the left ventricle and aorta.    ANGIOGRAPHIC DATA:   The left main coronary artery is widely patent.  The left anterior descending artery is a large vessel which wraps around the apex. There several small to medium-size diagonals. The LAD system appears into grafting normal..  The left circumflex artery is a large vessel. There is a large first obtuse marginal which is widely patent. The circumflex system is angiographic normal.  The right coronary artery is a large dominant vessel. There is a large PDA and a medium-sized posterolateral artery. The RCA system is normal.  LEFT VENTRICULOGRAM:  Left ventricular angiogram was done in the 30 RAO projection and revealed severely decreased in timated ejection fraction of 20%.   there is apical ballooning hypokinesis of the mid to distal anterior and inferior segments. There is normal contraction of the base. LVEDP was 34 mmHg.  IMPRESSIONS:  1. Normal left main coronary artery. 2. Normal left anterior  descending artery and its branches. 3. Normal left circumflex artery and its branches. 4. Normal right coronary artery. 5. Severely decreased left ventricular systolic function.  LVEDP  34 mmHg.  Ejection fraction  20%.  This likely represents Takatsubo cardiomyopathy.   RECOMMENDATION:  Continue medical therapy for heart failure. No need for anticoagulation at this time. No need for antiplatelet therapy.  She will followup  With Dr. Marlou Porch.

## 2014-03-25 NOTE — Significant Event (Signed)
Rapid Response Event Note  Overview: Time Called: 0738 Arrival Time: 0739 Event Type: Respiratory  Initial Focused Assessment :Respiratory   Interventions: NRB, Stat PCXR, EKG, Stat KUB   Event Summary:   at      at     RRT called by bedside RN for SOB. Upon arrival pt supine upright in bed. Respirations labored and tachypneic at 34. Pt flushed, clammy, skin hot to touch. No c/o pain or pressure at this time.Sats 84% on [place] 5 L. NRB applied with sats immediately to 98%.  SBP 160-170's, DBP 90-98, HR 80's to low 100's, RR 30's. Sat 98%. Stat EKG, PCXR ordered. Megan PA and Will Beecher PA in room. Further orders received. Transferred to ICU 1229. Pt started c/o chest pressure. Asa 324mg  given (chewed), Nitroglycerine 1 SL given. Heparin drip started at 650units/hr. Lopressor 5 mg given repeated 10 minutes later. Lasix 40 mg given. Foley inserted.  See bedside ICU RN notes from here.     Adisa Vigeant F

## 2014-03-26 DIAGNOSIS — E440 Moderate protein-calorie malnutrition: Secondary | ICD-10-CM | POA: Insufficient documentation

## 2014-03-26 LAB — BASIC METABOLIC PANEL
BUN: 23 mg/dL (ref 6–23)
CO2: 23 mEq/L (ref 19–32)
Calcium: 8.1 mg/dL — ABNORMAL LOW (ref 8.4–10.5)
Chloride: 103 mEq/L (ref 96–112)
Creatinine, Ser: 0.42 mg/dL — ABNORMAL LOW (ref 0.50–1.10)
Glucose, Bld: 103 mg/dL — ABNORMAL HIGH (ref 70–99)
Potassium: 2.8 mEq/L — CL (ref 3.7–5.3)
Sodium: 142 mEq/L (ref 137–147)

## 2014-03-26 LAB — CBC
HEMATOCRIT: 38 % (ref 36.0–46.0)
Hemoglobin: 13.2 g/dL (ref 12.0–15.0)
MCH: 34.3 pg — ABNORMAL HIGH (ref 26.0–34.0)
MCHC: 34.7 g/dL (ref 30.0–36.0)
MCV: 98.7 fL (ref 78.0–100.0)
Platelets: 186 10*3/uL (ref 150–400)
RBC: 3.85 MIL/uL — ABNORMAL LOW (ref 3.87–5.11)
RDW: 12.8 % (ref 11.5–15.5)
WBC: 13.3 10*3/uL — ABNORMAL HIGH (ref 4.0–10.5)

## 2014-03-26 LAB — MAGNESIUM: Magnesium: 1.6 mg/dL (ref 1.5–2.5)

## 2014-03-26 MED ORDER — BOOST / RESOURCE BREEZE PO LIQD
1.0000 | Freq: Three times a day (TID) | ORAL | Status: DC
  Administered 2014-03-26 – 2014-03-29 (×4): 1 via ORAL
  Filled 2014-03-26: qty 1

## 2014-03-26 MED ORDER — IRBESARTAN 150 MG PO TABS
150.0000 mg | ORAL_TABLET | Freq: Every day | ORAL | Status: DC
  Administered 2014-03-26 – 2014-03-29 (×4): 150 mg via ORAL
  Filled 2014-03-26 (×4): qty 1

## 2014-03-26 MED ORDER — POTASSIUM CHLORIDE 20 MEQ/15ML (10%) PO LIQD
40.0000 meq | ORAL | Status: AC
  Administered 2014-03-26 (×2): 40 meq via ORAL
  Filled 2014-03-26 (×2): qty 30

## 2014-03-26 MED ORDER — POTASSIUM CHLORIDE 10 MEQ/100ML IV SOLN
10.0000 meq | INTRAVENOUS | Status: DC
  Administered 2014-03-26 (×3): 10 meq via INTRAVENOUS
  Filled 2014-03-26 (×2): qty 100

## 2014-03-26 MED FILL — Sodium Chloride IV Soln 0.9%: INTRAVENOUS | Qty: 50 | Status: AC

## 2014-03-26 NOTE — Progress Notes (Signed)
Patient Name: Kimberly Jordan Date of Encounter: 03/26/2014  Active Problems:   SBO (small bowel obstruction)   Acute anterior myocardial infarction   Acute combined systolic and diastolic heart failure   Congestive dilated cardiomyopathy   Acute respiratory failure with hypoxia   Length of Stay: 7  SUBJECTIVE  No chest pain. No dyspnea at rest.  CURRENT MEDS . amLODipine  5 mg Oral Daily  . chlorhexidine  15 mL Mouth/Throat BID  . famotidine (PEPCID) IV  20 mg Intravenous Q12H  . heparin  5,000 Units Subcutaneous 3 times per day  . hydrALAZINE  25 mg Oral 3 times per day  . metoCLOPramide (REGLAN) injection  5 mg Intravenous 3 times per day  . potassium chloride  10 mEq Intravenous Q1H    OBJECTIVE   Intake/Output Summary (Last 24 hours) at 03/26/14 0956 Last data filed at 03/26/14 0900  Gross per 24 hour  Intake 1504.58 ml  Output   2560 ml  Net -1055.42 ml   Filed Weights   03/25/14 0218 03/25/14 0830 03/26/14 0500  Weight: 52.5 kg (115 lb 11.9 oz) 52.164 kg (115 lb) 52.1 kg (114 lb 13.8 oz)    PHYSICAL EXAM Filed Vitals:   03/26/14 0700 03/26/14 0735 03/26/14 0800 03/26/14 0915  BP: 167/90  140/77 164/46  Pulse: 51  53 44  Temp:  98.6 F (37 C)    TempSrc:  Oral    Resp: 24  22 20   Height:      Weight:      SpO2: 96%  96% 95%   General: Alert, oriented x3, no distress Head: no evidence of trauma, PERRL, EOMI, no exophtalmos or lid lag, no myxedema, no xanthelasma; normal ears, nose and oropharynx Neck: normal jugular venous pulsations and no hepatojugular reflux; brisk carotid pulses without delay and no carotid bruits Chest: clear to auscultation, no signs of consolidation by percussion or palpation, normal fremitus, symmetrical and full respiratory excursions Cardiovascular: normal position and quality of the apical impulse, regular rhythm, normal first and paradoxically spit second heart sounds, no rubs or gallops, no murmur Abdomen: no tenderness  or distention, no masses by palpation, no abnormal pulsatility or arterial bruits, normal bowel sounds, no hepatosplenomegaly Extremities: no clubbing, cyanosis or edema; 2+ radial, ulnar and brachial pulses bilaterally; 2+ right femoral, posterior tibial and dorsalis pedis pulses; 2+ left femoral, posterior tibial and dorsalis pedis pulses; no subclavian or femoral bruits Neurological: grossly nonfocal  LABS  CBC  Recent Labs  03/25/14 0815 03/26/14 0301  WBC 15.9* 13.3*  HGB 14.2 13.2  HCT 41.3 38.0  MCV 99.3 98.7  PLT 231 811   Basic Metabolic Panel  Recent Labs  03/25/14 0815 03/26/14 0301  NA 141 142  K 4.1 2.8*  CL 103 103  CO2 18* 23  GLUCOSE 118* 103*  BUN 22 23  CREATININE 0.47* 0.42*  CALCIUM 8.7 8.1*  MG  --  1.6   Liver Function Tests  Recent Labs  03/25/14 0815  AST 29  ALT 22  ALKPHOS 73  BILITOT 0.9  PROT 6.2  ALBUMIN 3.2*   No results found for this basename: LIPASE, AMYLASE,  in the last 72 hours Cardiac Enzymes  Recent Labs  03/25/14 0815  TROPONINI 0.33*    Radiology Studies Imaging results have been reviewed and Dg Chest Port 1 View  03/25/2014   CLINICAL DATA:  Increasing shortness of Breath  EXAM: PORTABLE CHEST - 1 VIEW  COMPARISON:  03/19/2014  FINDINGS:  Cardiac shadow is stable. A nasogastric catheter is noted within the stomach. Patchy infiltrative changes are noted throughout both lungs with superimposed interstitial change likely representing some CHF.  IMPRESSION: Patchy infiltrates bilaterally with superimposed interstitial edema.   Electronically Signed   By: Inez Catalina M.D.   On: 03/25/2014 08:09   Dg Abd Portable 1v  03/25/2014   CLINICAL DATA:  Small bowel obstruction  EXAM: PORTABLE ABDOMEN - 1 VIEW  COMPARISON:  DG ABDOMEN 1V dated 03/22/2014  FINDINGS: Stable NG tube. Postoperative changes stable. Distended small bowel loops improved. Colonic gas increased. No obvious free intraperitoneal gas.  IMPRESSION: Improved  partial small bowel obstruction pattern.   Electronically Signed   By: Maryclare Bean M.D.   On: 03/25/2014 08:24    TELE NSR with very frequent PACs  ECG SR, LBBB  ASSESSMENT AND PLAN   Likely stress cardiomyopathy, with severely depressed LV systolic function, normal coronaries by angio. Alternatively, this may be ignored cardiomyopathy with decompensation precipitated by surgery (note preexisting LBBB). No longer appears to be in acute CHF. BP high, renal function normal. She was on ARB - will restart. I think it is still premature to start beta blockers.  Reevaluate EF in one week - if marked improvement, she had Takotsubo sd. and will expect complete recovery. If EF remains low it is more likely she has chronic nonischemic cardiomyopathy and might benefit from CRT-P as well as gradual titration of ARb and beta blocker.   Sanda Klein, MD, [facility] [phone] office [phone] pager 03/26/2014 9:56 AM

## 2014-03-26 NOTE — Progress Notes (Addendum)
1 Day Post-Op  Subjective: Passed a lot of gas overnight and this AM  Objective: Vital signs in last 24 hours: Temp:  [97.3 F (36.3 C)-98.6 F (37 C)] 98.6 F (37 C) (03/27 0735) Pulse Rate:  [31-104] 44 (03/27 0915) Resp:  [13-30] 20 (03/27 0915) BP: (120-186)/(40-111) 164/46 mmHg (03/27 0915) SpO2:  [92 %-96 %] 95 % (03/27 0915) Weight:  [114 lb 13.8 oz (52.1 kg)] 114 lb 13.8 oz (52.1 kg) (03/27 0500) Last BM Date: 03/21/14  Intake/Output from previous day: 03/26 0701 - 03/27 0700 In: 1145.6 [P.O.:480; I.V.:315.6; IV Piggyback:350] Out: 2560 [Urine:2460; Emesis/NG output:100] Intake/Output this shift: Total I/O In: 369 [P.O.:240; I.V.:29; IV Piggyback:100] Out: -   General appearance: alert and cooperative Nose: NGT Resp: few rales Cardio: reg with frequent ectopy GI: soft, ND, active BS, incision visable thru dressing CDI  Lab Results:   Recent Labs  03/25/14 0815 03/26/14 0301  WBC 15.9* 13.3*  HGB 14.2 13.2  HCT 41.3 38.0  PLT 231 186   BMET  Recent Labs  03/25/14 0815 03/26/14 0301  NA 141 142  K 4.1 2.8*  CL 103 103  CO2 18* 23  GLUCOSE 118* 103*  BUN 22 23  CREATININE 0.47* 0.42*  CALCIUM 8.7 8.1*   PT/INR No results found for this basename: LABPROT, INR,  in the last 72 hours ABG No results found for this basename: PHART, PCO2, PO2, HCO3,  in the last 72 hours  Studies/Results: Dg Chest Port 1 View  03/25/2014   CLINICAL DATA:  Increasing shortness of Breath  EXAM: PORTABLE CHEST - 1 VIEW  COMPARISON:  03/19/2014  FINDINGS: Cardiac shadow is stable. A nasogastric catheter is noted within the stomach. Patchy infiltrative changes are noted throughout both lungs with superimposed interstitial change likely representing some CHF.  IMPRESSION: Patchy infiltrates bilaterally with superimposed interstitial edema.   Electronically Signed   By: Inez Catalina M.D.   On: 03/25/2014 08:09   Dg Abd Portable 1v  03/25/2014   CLINICAL DATA:  Small bowel  obstruction  EXAM: PORTABLE ABDOMEN - 1 VIEW  COMPARISON:  DG ABDOMEN 1V dated 03/22/2014  FINDINGS: Stable NG tube. Postoperative changes stable. Distended small bowel loops improved. Colonic gas increased. No obvious free intraperitoneal gas.  IMPRESSION: Improved partial small bowel obstruction pattern.   Electronically Signed   By: Maryclare Bean M.D.   On: 03/25/2014 08:24    Anti-infectives: Anti-infectives   Start     Dose/Rate Route Frequency Ordered Stop   03/22/14 1100  ceFAZolin (ANCEF) IVPB 2 g/50 mL premix     2 g 100 mL/hr over 30 Minutes Intravenous  Once 03/22/14 1032 03/22/14 1150      Assessment/Plan: s/p Procedure(s): LEFT HEART CATHETERIZATION WITH CORONARY ANGIOGRAM (N/A) POD #4 s/p Ex lap with LOA for adhesive band Post-operate ileus - resolved. D/C NGT. Clears today. Hypokalemia - after Lasix. Replacing. CHF - Appreciate cardiology management. S/P cath. S/P diuresis. They may start ACE today.  HTN - per cardiology as above and per Dr. Marlou Porch' note VTE - SQ hep Dispo - PT/OT eval   LOS: 7 days    Harlene Petralia E 03/26/2014

## 2014-03-26 NOTE — Progress Notes (Signed)
PT Cancellation Note  Patient Details Name: Kimberly Jordan MRN: [id number] DOB: [birth year]   Cancelled Treatment:    Reason Eval/Treat Not Completed: Other (comment) (Refused.  Just worked with OT and just back in bed as well.)   INGOLD,Ahmia Colford 03/26/2014, 2:49 PM [facility] [phone] (pager)

## 2014-03-26 NOTE — Progress Notes (Signed)
PULMONARY / CRITICAL CARE MEDICINE   Name: Kimberly Jordan MRN: [id number] DOB: 11/22/1929    ADMISSION DATE:  03/19/2014 CONSULTATION DATE: 3/26  REFERRING MD :  CCS PRIMARY SERVICE: CCS  CHIEF COMPLAINT:  SOB  BRIEF PATIENT DESCRIPTION:  80 yo former [place] who underwent exp lap for lysis of adhesions 3/23 and was progressing well. She developed acute sob 3/26 730 am, HT< sbp 180's, hypoxia (sats 80% on 5 l [place]) and rapid response was activated. She presented to ICU awake and alert. No complaints of pain, sats 98% on 70% NRB, sbp 165m st 112 but jvd mandible and anterior lateral EKG changes with no prexios12 lead to compare. Given asa and IV lopressor, heprin, lasix and cardiology consulted. PCCM asked to consult while in ICU.  SIGNIFICANT EVENTS / STUDIES:  3/26 SOB,HTN,EKG changes 3/26 left heart cath: nml CAs, EF 20% felt to reflect Takatsubo CM   LINES / TUBES:  CULTURES:  ANTIBIOTICS:  SUBJECTIVE:  No distress, feels better.   VITAL SIGNS: Temp:  [97.3 F (36.3 C)-98.6 F (37 C)] 98.6 F (37 C) (03/27 0735) Pulse Rate:  [44-104] 44 (03/27 0915) Resp:  [13-30] 20 (03/27 0915) BP: (120-186)/(40-111) 164/46 mmHg (03/27 0915) SpO2:  [92 %-96 %] 95 % (03/27 0915) Weight:  [52.1 kg (114 lb 13.8 oz)] 52.1 kg (114 lb 13.8 oz) (03/27 0500) 4 liters  HEMODYNAMICS:   VENTILATOR SETTINGS:   INTAKE / OUTPUT: Intake/Output     03/26 0701 - 03/27 0700 03/27 0701 - 03/28 0700   P.O. 480 240   I.V. (mL/kg) 315.6 (6.1) 29 (0.6)   IV Piggyback 350 100   Total Intake(mL/kg) 1145.6 (22) 369 (7.1)   Urine (mL/kg/hr) 2460 (2)    Emesis/NG output 100 (0.1)    Total Output 2560     Net -1414.4 +369          PHYSICAL EXAMINATION: General:  wnwdwf in moderate distress Neuro:  Intact HEENT: decreased JVD  Cardiovascular:  HSR RRR ST No murmur Lungs:  Bibasilar crackles improved  Abdomen:  Tender, suture line intact Musculoskeletal:  intact Skin:  Lower ext cool.  Slightly clammy.  LABS:  CBC  Recent Labs Lab 03/22/14 0456 03/25/14 0815 03/26/14 0301  WBC 10.7* 15.9* 13.3*  HGB 13.0 14.2 13.2  HCT 38.6 41.3 38.0  PLT 146* 231 186   Coag's  Recent Labs Lab 03/20/14 0503  APTT 35  INR 1.12   BMET  Recent Labs Lab 03/23/14 0451 03/25/14 0815 03/26/14 0301  NA 139 141 142  K 3.9 4.1 2.8*  CL 102 103 103  CO2 24 18* 23  BUN 19 22 23   CREATININE 0.49* 0.47* 0.42*  GLUCOSE 107* 118* 103*   Electrolytes  Recent Labs Lab 03/22/14 0456 03/23/14 0451 03/25/14 0815 03/26/14 0301  CALCIUM 8.8 8.2* 8.7 8.1*  MG 1.7 1.5  --  1.6   Sepsis Markers No results found for this basename: LATICACIDVEN, PROCALCITON, O2SATVEN,  in the last 168 hours ABG No results found for this basename: PHART, PCO2ART, PO2ART,  in the last 168 hours Liver Enzymes  Recent Labs Lab 03/21/14 0531 03/25/14 0815  AST 23 29  ALT 13 22  ALKPHOS 63 73  BILITOT 1.0 0.9  ALBUMIN 3.4* 3.2*   Cardiac Enzymes  Recent Labs Lab 03/25/14 0815  TROPONINI 0.33*  PROBNP 10991.0*   Glucose  Recent Labs Lab 03/22/14 1003  GLUCAP 145*    Imaging Dg Chest [facility]  03/25/2014   CLINICAL DATA:  Increasing shortness of Breath  EXAM: PORTABLE CHEST - 1 VIEW  COMPARISON:  03/19/2014  FINDINGS: Cardiac shadow is stable. A nasogastric catheter is noted within the stomach. Patchy infiltrative changes are noted throughout both lungs with superimposed interstitial change likely representing some CHF.  IMPRESSION: Patchy infiltrates bilaterally with superimposed interstitial edema.   Electronically Signed   By: Inez Catalina M.D.   On: 03/25/2014 08:09   Dg Abd Portable 1v  03/25/2014   CLINICAL DATA:  Small bowel obstruction  EXAM: PORTABLE ABDOMEN - 1 VIEW  COMPARISON:  DG ABDOMEN 1V dated 03/22/2014  FINDINGS: Stable NG tube. Postoperative changes stable. Distended small bowel loops improved. Colonic gas increased. No obvious free intraperitoneal gas.   IMPRESSION: Improved partial small bowel obstruction pattern.   Electronically Signed   By: Maryclare Bean M.D.   On: 03/25/2014 08:24    ASSESSMENT / PLAN:  PULMONARY A:acute respiratory failure in setting of decompensated HF >better  P:   O2 as needed Cont diuresis as needed and CHF treatment  F/u cxr PRN IS when able, appears with some at x as well  CARDIOVASCULAR A: HTN    Takatsubo CM EF 20% >cards following  P:  Cont medical rx for heart failure as per cards  Neg balance succcessful  RENAL A: hypokalemia  P:   Replace K  Trend cr   GASTROINTESTINAL A:  Plap 3/23 for sbo P:   Per CCS  HEMATOLOGIC A:  No acute P:  Monitor for bleeding  INFECTIOUS A:  No acute issue P:   Trend wbc   ENDOCRINE A:  No acute issue  P:   Trend glucose   NEUROLOGIC A:  Intact P:   Supportive care   We will s/o call PRN  03/26/2014 9:58 AM  Lavon Paganini. Titus Mould, MD, [place] Pgr: [facility]

## 2014-03-26 NOTE — Progress Notes (Signed)
Rehab Admissions Coordinator Note:  Patient was screened by Cleatrice Burke for appropriateness for an Inpatient Acute Rehab Consult per OT recommendation.  At this time, we are recommending await further progress over the weekend. Pt's [employer] Medicare policy unlikely to approve an inpt rehab stay. Cleatrice Burke 03/26/2014, 3:00 PM  I can be reached at [phone].

## 2014-03-26 NOTE — Progress Notes (Signed)
INITIAL NUTRITION ASSESSMENT  DOCUMENTATION CODES Per approved criteria  -Non-severe (moderate) malnutrition in the context of chronic illness   INTERVENTION: 1.  Modify diet; resume PO diet once medically appropriate per MD discretion. 2.  Supplements; Resource Breeze po TID, each supplement provides 250 kcal and 9 grams of protein  NUTRITION DIAGNOSIS: Inadequate oral intake related to omission of energy dense foods as evidenced by NPO/clear liquids.   Monitor:  1.  Food/Beverage; pt meeting >/=90% estimated needs with tolerance. 2.  Wt/wt change; monitor trends  Reason for Assessment: MST  80 y.o. female  Admitting Dx: SBO  ASSESSMENT: Pt admitted with SBO. Pt underwent ex-lap for lysis of adhesions (3/23) and was progressing well.  Pt has been NPO/clear liquids x7 days.  Pt is passing gas. NGT has been d/c'd. Pt states she eats well at home.  She feels she has been eating per her usual.  Daughter states her weight on admission to [facility] was 107 lbs, however pt states that her subsequent weights have been more consistent with her usual of 116 lbs.  Pt uses a walking stick when outside, but otherwise moves around at home on her own.   Nutrition Focused Physical Exam: Subcutaneous Fat:  Orbital Region: WNL Upper Arm Region: mild wasting Thoracic and Lumbar Region: mild-moderate wasting  Muscle:  Temple Region: mild wasting Clavicle Bone Region: severe wasting Clavicle and Acromion Bone Region: severe wasting Scapular Bone Region: moderate wasting Dorsal Hand: severe wasting Patellar Region: mild wasting Anterior Thigh Region: moderate wasting Posterior Calf Region: moderate-severe wasting  Edema: none present  Pt meets criteria for moderate MALNUTRITION in the context of chronic illness as evidenced by mild subcutaneous wasting, moderate-severe muscle wasting without associated wt loss.  Height: Ht Readings from Last 1 Encounters:  03/25/14 5' (1.524 m)     Weight: Wt Readings from Last 1 Encounters:  03/26/14 114 lb 13.8 oz (52.1 kg)    Ideal Body Weight: 100 lbs  % Ideal Body Weight: 114%  Wt Readings from Last 10 Encounters:  03/26/14 114 lb 13.8 oz (52.1 kg)  03/26/14 114 lb 13.8 oz (52.1 kg)  03/26/14 114 lb 13.8 oz (52.1 kg)  02/02/14 113 lb (51.256 kg)    Usual Body Weight: 113-114 lbs  % Usual Body Weight: 100%  BMI:  Body mass index is 22.43 kg/(m^2).  Estimated Nutritional Needs: Kcal: 1300-1500 Protein: 62-73g Fluid: ~1.5 L/day  Skin: intact  Diet Order: Clear Liquid  EDUCATION NEEDS: -Education needs addressed   Intake/Output Summary (Last 24 hours) at 03/26/14 1511 Last data filed at 03/26/14 1300  Gross per 24 hour  Intake   1378 ml  Output    570 ml  Net    808 ml    Last BM: 3/22  Labs:   Recent Labs Lab 03/22/14 0456 03/23/14 0451 03/25/14 0815 03/26/14 0301  NA 136* 139 141 142  K 3.2* 3.9 4.1 2.8*  CL 98 102 103 103  CO2 28 24 18* 23  BUN 14 19 22 23   CREATININE 0.45* 0.49* 0.47* 0.42*  CALCIUM 8.8 8.2* 8.7 8.1*  MG 1.7 1.5  --  1.6  GLUCOSE 149* 107* 118* 103*    CBG (last 3)  No results found for this basename: GLUCAP,  in the last 72 hours  Scheduled Meds: . amLODipine  5 mg Oral Daily  . chlorhexidine  15 mL Mouth/Throat BID  . famotidine (PEPCID) IV  20 mg Intravenous Q12H  . heparin  5,000 Units Subcutaneous  3 times per day  . hydrALAZINE  25 mg Oral 3 times per day  . irbesartan  150 mg Oral Daily  . potassium chloride  40 mEq Oral Q4H    Continuous Infusions: . sodium chloride 10 mL/hr at 03/25/14 1155  . lactated ringers    . nitroGLYCERIN 15 mcg/min (03/26/14 0900)    Past Medical History  Diagnosis Date  . Breast cancer 1992    right breast   . Hypertension   . Blepharitis   . Osteoporosis   . Ectopic pregnancy   . Colon polyp     Past Surgical History  Procedure Laterality Date  . Ectopic pregnancy surgery      salpingectomy  .  Colonoscopy w/ polypectomy    . Hand surgery  2007    right hand-ruptured tendon  . Cataract extraction  2010, 2011    2010 right eye, 2011 left eye  . Cyst removed  2012    from right wrist  . Total abdominal hysterectomy w/ bilateral salpingoophorectomy  1994  . Knee arthroscopy  1998  . Laparotomy N/A 03/22/2014    Procedure: EXPLORATORY LAPAROTOMY;  Surgeon: Merrie Roof, MD;  Location: [facility];  Service: General;  Laterality: N/A;    Brynda Greathouse, MS RD LDN Clinical Inpatient Dietitian Pager: [phone] Weekend/After hours pager: [phone]

## 2014-03-26 NOTE — Evaluation (Signed)
Occupational Therapy Evaluation Patient Details Name: Kimberly Jordan MRN: [id number] DOB: September 21, 1929 Today's Date: 03/26/2014    History of Present Illness 80 y/o woman, and former [place], was at her baseline of health until about 2AM this morning when she developed pain in her abdomen, and said it felt like a balloon was in there.  She then started vomiting and this was followed by dry heaves.  She did not improve and came to the ER where CT scan shows a high-grade small bowel obstruction with transition point locatedwithin the right lower abdominal quadrant. 3/23 s/p EXPLORATORY LAPAROTOMY . MI noted on 03/25/14 s/p left heart catherization   Clinical Impression   This 80 yo female admitted with above presents to acute OT with decreased balance, cardiopulmonary issues, abdominal incision, generalized weakness all affecting pt's ability to care for herself at home. Would benefit from short stay on CIR to get back to an Independent or Mod I level.    Follow Up Recommendations  CIR    Equipment Recommendations   (TBD)       Precautions / Restrictions Precautions Precautions: Fall Precaution Comments: abdominal wound, left forearm area treat with caution due to where they did cardiac cath Restrictions Weight Bearing Restrictions: Yes Other Position/Activity Restrictions: They told her to treat her arm like she had broken it due to left forearm where they had done the cardiac cath and they do not want her pushing/pulling/lifting with it evidently(how long I do not know)--per pt report      Mobility Bed Mobility Overal bed mobility: Needs Assistance Bed Mobility: Sit to Supine       Sit to supine: Min guard      Transfers Overall transfer level: Needs assistance Equipment used: Rolling walker (2 wheeled) Transfers: Sit to/from Stand Sit to Stand: Min assist                   ADL Eating/Feeding: Independent Grooming: Wash/dry hands (min guard A)   Upper  Body Dressing : Minimal assistance;Sitting Lower Body Bathing: Minimal assistance;Sit to/from stand Lower Body Dressing: Moderate assistance;Sit to/from stand Toilet Transfer: Minimal assistance;Comfort height toilet;Grab bars Toileting- Clothing Manipulation and Hygiene: Min guard;Sit to/from stand   Functional mobility during ADLs: Minimal assistance;Rolling walker; 40 feet       Vision  No issues                          Pertinent Vitals/Pain Sats 100% on RA so lowered O2 to 2 liters (pt currently on 3)     Hand Dominance Right   Extremity/Trunk Assessment Upper Extremity Assessment Upper Extremity Assessment: Overall WFL for tasks assessed           Communication Communication Communication: No difficulties   Cognition Arousal/Alertness: Awake/alert Behavior During Therapy: WFL for tasks assessed/performed Overall Cognitive Status: Within Functional Limits for tasks assessed                             Home Living Family/patient expects to be discharged to:: Private residence Living Arrangements: Spouse/significant other Available Help at Discharge: Family;Available 24 hours/day Type of Home: House Home Access: Stairs to enter [employer] of Steps: 3 Entrance Stairs-Rails: Right;Left Home Layout: Two level;Able to live on main level with bedroom/bathroom     Bathroom Shower/Tub: Tub/shower unit;Curtain Shower/tub characteristics: Curtain       Home Equipment: Building services engineer  Comments: 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[place], 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, husband walks with a cane      Prior Functioning/Environment Level of Independence: Independent                OT Problem List: Decreased strength;Impaired balance (sitting and/or standing);Decreased knowledge of use of DME or AE;Cardiopulmonary status limiting activity   OT Treatment/Interventions: Self-care/ADL training;Patient/family education;Balance  training;DME and/or AE instruction    OT Goals(Current goals can be found in the care plan section) Acute Rehab OT Goals OT Goal Formulation: With patient Time For Goal Achievement: 04/09/14  OT Frequency: Min 2X/week           End of Session: Equipment Utilized During Treatment: Rolling walker;Oxygen (3 liters)  Activity Tolerance: Patient tolerated treatment well Patient left: in bed;with call bell/phone within reach;with family/visitor present   Time: 5427-0623 OT Time Calculation (min): 45 min Charges:  OT General Charges $OT Visit: 1 Procedure OT Treatments $Self Care/Home Management : 38-52 mins  Almon Register [phone] 03/26/2014, 2:35 PM

## 2014-03-27 LAB — BASIC METABOLIC PANEL
BUN: 21 mg/dL (ref 6–23)
CO2: 24 mEq/L (ref 19–32)
Calcium: 8.4 mg/dL (ref 8.4–10.5)
Chloride: 104 mEq/L (ref 96–112)
Creatinine, Ser: 0.44 mg/dL — ABNORMAL LOW (ref 0.50–1.10)
GFR calc non Af Amer: >90 mL/min (ref >90)
GLUCOSE: 107 mg/dL — AB (ref 70–99)
Potassium: 4 mEq/L (ref 3.7–5.3)
SODIUM: 139 meq/L (ref 137–147)

## 2014-03-27 LAB — CBC
HCT: 37.5 % (ref 36.0–46.0)
HEMOGLOBIN: 13.1 g/dL (ref 12.0–15.0)
MCH: 34.4 pg — ABNORMAL HIGH (ref 26.0–34.0)
MCHC: 34.9 g/dL (ref 30.0–36.0)
MCV: 98.4 fL (ref 78.0–100.0)
Platelets: 216 10*3/uL (ref 150–400)
RBC: 3.81 MIL/uL — ABNORMAL LOW (ref 3.87–5.11)
RDW: 13 % (ref 11.5–15.5)
WBC: 10.3 10*3/uL (ref 4.0–10.5)

## 2014-03-27 MED ORDER — FAMOTIDINE 20 MG PO TABS
20.0000 mg | ORAL_TABLET | Freq: Two times a day (BID) | ORAL | Status: DC
  Administered 2014-03-27 – 2014-03-29 (×5): 20 mg via ORAL
  Filled 2014-03-27 (×6): qty 1

## 2014-03-27 MED ORDER — CARVEDILOL 3.125 MG PO TABS
3.1250 mg | ORAL_TABLET | Freq: Two times a day (BID) | ORAL | Status: DC
  Administered 2014-03-28 – 2014-03-29 (×3): 3.125 mg via ORAL
  Filled 2014-03-27 (×7): qty 1

## 2014-03-27 NOTE — Evaluation (Signed)
Physical Therapy Evaluation and Discharge Patient Details Name: Kimberly Jordan MRN: [id number] DOB: 08-25-1929 Today's Date: 03/27/2014   History of Present Illness  80 y/o developed pain in her abdomen, and said it felt like a balloon was in there.  She then started vomiting and this was followed by dry heaves.  She did not improve and came to the ER where CT scan shows a high-grade small bowel obstruction with transition point locatedwithin the right lower abdominal quadrant. 3/23 s/p EXPLORATORY LAPAROTOMY . MI noted on 03/25/14 s/p left heart catherization  Clinical Impression  Patient evaluated by Physical Therapy with no further acute PT needs identified. All education has been completed and the patient has no further questions. See below for any follow-up Physial Therapy or equipment needs. PT is signing off. Thank you for this referral.     Follow Up Recommendations No PT follow up;Supervision - Intermittent    Equipment Recommendations  None recommended by PT    Recommendations for Other Services       Precautions / Restrictions        Mobility  Bed Mobility Overal bed mobility: Modified Independent Bed Mobility: Supine to Sit     Supine to sit: Modified independent (Device/Increase time)     General bed mobility comments: off ICU bed with mattress deflated  Transfers Overall transfer level: Modified independent Equipment used: None Transfers: Sit to/from Stand Sit to Stand: Modified independent (Device/Increase time)         General transfer comment: x 3 with or without use of UEs  Ambulation/Gait Ambulation/Gait assistance: Supervision Ambulation Distance (Feet): 200 Feet Assistive device: None Gait Pattern/deviations: WFL(Within Functional Limits)     General Gait Details: able to maintain straight course; feels she is walking a bit slower "as would be expected after surgery, wouldn't it?"  Stairs Stairs:  (pt reports no difficulties PTA, uses  rail)          Wheelchair Mobility    Modified Rankin (Stroke Patients Only)       Balance Overall balance assessment: Independent   Sitting balance-Leahy Scale: Normal       Standing balance-Leahy Scale: Good       Tandem Stance - Right Leg: 30   Rhomberg - Eyes Opened: 60 [place] - Eyes Closed: 30 High level balance activites: Backward walking;Direction changes;Turns;Head turns       Pertinent Vitals/Pain SaO2 97% on 2L       Down to 90% on RA with walking       Once sitting, placed on 1L and notified RN       94% after 10 min seated HR 84-102    Home Living Family/patient expects to be discharged to:: Private residence Living Arrangements: Spouse/significant other Available Help at Discharge: Family;Available 24 hours/day Type of Home: House Home Access: Stairs to enter Entrance Stairs-Rails: Psychiatric nurse of Steps: 3 Home Layout: Two level;Able to live on main level with bedroom/bathroom Home Equipment: Shower seat Additional Comments: 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[place], 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, husband walks with a cane    Prior Function Level of Independence: Independent         Comments: uses "field stick" when walking trails (bird watching)     Hand Dominance   Dominant Hand: Right    Extremity/Trunk Assessment   Upper Extremity Assessment: Defer to OT evaluation           Lower Extremity Assessment: Overall WFL for tasks assessed  Cervical / Trunk Assessment: Kyphotic  Communication   Communication: No difficulties  Cognition Arousal/Alertness: Awake/alert Behavior During Therapy: WFL for tasks assessed/performed Overall Cognitive Status: Within Functional Limits for tasks assessed                      General Comments      Exercises        Assessment/Plan    PT Assessment Patent does not need any further PT services  PT Diagnosis     PT Problem List    PT Treatment  Interventions     PT Goals (Current goals can be found in the Care Plan section) Acute Rehab PT Goals PT Goal Formulation: No goals set, d/c therapy    Frequency     Barriers to discharge        End of Session Equipment Utilized During Treatment: Gait belt;Oxygen Activity Tolerance: Patient tolerated treatment well Patient left: in chair;with call bell/phone within reach         Time: 0957-1029 PT Time Calculation (min): 32 min   Charges:   PT Evaluation $Initial PT Evaluation Tier I: 1 Procedure PT Treatments $Gait Training: 8-22 mins   PT G Codes:          Ester Mabe 19-Apr-2014, 10:44 AM Pager [phone]

## 2014-03-27 NOTE — Progress Notes (Signed)
CCS/Tajana Crotteau Progress Note 2 Days Post-Op  Subjective: Patient doing very well today.  Had several bowel movements yesterday.  Tolerating diet well, clear liquids.  Objective: Vital signs in last 24 hours: Temp:  [97.8 F (36.6 C)-98.2 F (36.8 C)] 98 F (36.7 C) (03/28 0400) Pulse Rate:  [44-102] 81 (03/28 0600) Resp:  [16-24] 18 (03/28 0600) BP: (100-178)/(39-94) 143/53 mmHg (03/28 0600) SpO2:  [92 %-100 %] 95 % (03/28 0600) Weight:  [51.6 kg (113 lb 12.1 oz)] 51.6 kg (113 lb 12.1 oz) (03/28 0500) Last BM Date: 03/26/14  Intake/Output from previous day: 03/27 0701 - 03/28 0700 In: 1053.9 [P.O.:720; I.V.:133.9; IV Piggyback:200] Out: 24 [Urine:63; Stool:3] Intake/Output this shift:    General: No acute distress  Lungs: Clear to auscultation  Abd: Soft, good bowel sounds.  Wound is covered.  Extremities: No clinical signs or symptoms of DVT  Neuro: Intact  Lab Results:  @LABLAST2 (wbc:2,hgb:2,hct:2,plt:2) BMET  Recent Labs  03/26/14 0301 03/27/14 0315  NA 142 139  K 2.8* 4.0  CL 103 104  CO2 23 24  GLUCOSE 103* 107*  BUN 23 21  CREATININE 0.42* 0.44*  CALCIUM 8.1* 8.4   PT/INR No results found for this basename: LABPROT, INR,  in the last 72 hours ABG No results found for this basename: PHART, PCO2, PO2, HCO3,  in the last 72 hours  Studies/Results: No results found.  Anti-infectives: Anti-infectives   Start     Dose/Rate Route Frequency Ordered Stop   03/22/14 1100  ceFAZolin (ANCEF) IVPB 2 g/50 mL premix     2 g 100 mL/hr over 30 Minutes Intravenous  Once 03/22/14 1032 03/22/14 1150      Assessment/Plan: s/p Procedure(s): LEFT HEART CATHETERIZATION WITH CORONARY ANGIOGRAM Advance diet to full liquids.  LOS: 8 days   Kathryne Eriksson. Dahlia Bailiff, MD, FACS [phone] [facility] 03/27/2014

## 2014-03-27 NOTE — Progress Notes (Signed)
    Subjective:  Feels much better. No CP, no SOB. In chair.   Objective:  Vital Signs in the last 24 hours: Temp:  [97.8 F (36.6 C)-98.2 F (36.8 C)] 98 F (36.7 C) (03/28 0400) Pulse Rate:  [55-102] 96 (03/28 1000) Resp:  [13-24] 13 (03/28 1000) BP: (100-178)/(39-84) 110/51 mmHg (03/28 1000) SpO2:  [92 %-100 %] 96 % (03/28 1000) Weight:  [113 lb 12.1 oz (51.6 kg)] 113 lb 12.1 oz (51.6 kg) (03/28 0500)  Intake/Output from previous day: 03/27 0701 - 03/28 0700 In: 1053.9 [P.O.:720; I.V.:133.9; IV Piggyback:200] Out: 12 [Urine:63; Stool:3]   Physical Exam: General: Thin elderly, in no acute distress. Head:  Normocephalic and atraumatic. Lungs: Clear to auscultation and percussion. Heart: Normal S1 and S2.  No murmur, rubs or gallops.  Abdomen: soft, non-tender, positive bowel sounds. Extremities: No clubbing or cyanosis. No edema. Neurologic: Alert and oriented x 3.    Lab Results:  Recent Labs  03/26/14 0301 03/27/14 0315  WBC 13.3* 10.3  HGB 13.2 13.1  PLT 186 216    Recent Labs  03/26/14 0301 03/27/14 0315  NA 142 139  K 2.8* 4.0  CL 103 104  CO2 23 24  GLUCOSE 103* 107*  BUN 23 21  CREATININE 0.42* 0.44*    Recent Labs  03/25/14 0815  TROPONINI 0.33*   Hepatic Function Panel  Recent Labs  03/25/14 0815  PROT 6.2  ALBUMIN 3.2*  AST 29  ALT 22  ALKPHOS 73  BILITOT 0.9   Telemetry: No adverse rhythms Personally viewed.    Cardiac Studies:  EF 25% Cath no CAD, ?Taketsubo  Assessment/Plan:  Active Problems:   SBO (small bowel obstruction)   Acute anterior myocardial infarction   Acute combined systolic and diastolic heart failure   Congestive dilated cardiomyopathy   Acute respiratory failure with hypoxia   Malnutrition of moderate degree   - Stopping amlodipine  - Starting coreg 3.125mg  BID  - Continue ARB  - Continue Hydral for afterload reduction as well (would be first med to DC).   - With stress induced cardiomyopathy  Leonie Man), hopeful improvement in EF over next several days. If chronic cardiomyopathy, EF may not improve.   - Reassess EF in 90 days.    - Getting close to being able to be DC'd from cardiac standpoint    SKAINS, [place] 03/27/2014, 11:26 AM

## 2014-03-28 LAB — CBC
HCT: 38.6 % (ref 36.0–46.0)
HEMOGLOBIN: 13.5 g/dL (ref 12.0–15.0)
MCH: 34.4 pg — ABNORMAL HIGH (ref 26.0–34.0)
MCHC: 35 g/dL (ref 30.0–36.0)
MCV: 98.2 fL (ref 78.0–100.0)
Platelets: 217 10*3/uL (ref 150–400)
RBC: 3.93 MIL/uL (ref 3.87–5.11)
RDW: 13 % (ref 11.5–15.5)
WBC: 9.2 10*3/uL (ref 4.0–10.5)

## 2014-03-28 MED ORDER — HYDROCODONE-ACETAMINOPHEN 5-325 MG PO TABS: 1.0000 | ORAL_TABLET | ORAL | Status: DC | PRN

## 2014-03-28 NOTE — Progress Notes (Signed)
3 Days Post-Op  Subjective: Just had another bowel movement Tolerating full liquids without difficulty Cardiology - OK to move to floor Minimal pain med usage  Objective: Vital signs in last 24 hours: Temp:  [98 F (36.7 C)-98.4 F (36.9 C)] 98.1 F (36.7 C) (03/29 0400) Pulse Rate:  [72-96] 91 (03/29 0903) Resp:  [13-21] 21 (03/28 1900) BP: (101-142)/(28-53) 142/40 mmHg (03/29 0903) SpO2:  [92 %-96 %] 92 % (03/29 0400) Weight:  [119 lb 11.4 oz (54.3 kg)] 119 lb 11.4 oz (54.3 kg) (03/29 0500) Last BM Date: 03/27/14  Intake/Output from previous day: 03/28 0701 - 03/29 0700 In: 980 [P.O.:980] Out: -  Intake/Output this shift:    General appearance: alert, cooperative and no distress GI: soft, non-distended; + BS Incision c/d/i through dressing  Lab Results:   Recent Labs  03/27/14 0315 03/28/14 0241  WBC 10.3 9.2  HGB 13.1 13.5  HCT 37.5 38.6  PLT 216 217   BMET  Recent Labs  03/26/14 0301 03/27/14 0315  NA 142 139  K 2.8* 4.0  CL 103 104  CO2 23 24  GLUCOSE 103* 107*  BUN 23 21  CREATININE 0.42* 0.44*  CALCIUM 8.1* 8.4   PT/INR No results found for this basename: LABPROT, INR,  in the last 72 hours ABG No results found for this basename: PHART, PCO2, PO2, HCO3,  in the last 72 hours  Studies/Results: No results found.  Anti-infectives: Anti-infectives   Start     Dose/Rate Route Frequency Ordered Stop   03/22/14 1100  ceFAZolin (ANCEF) IVPB 2 g/50 mL premix     2 g 100 mL/hr over 30 Minutes Intravenous  Once 03/22/14 1032 03/22/14 1150      Assessment/Plan: s/p Procedure(s): LEFT HEART CATHETERIZATION WITH CORONARY ANGIOGRAM (N/A) Advance diet Transfer to floor Ambulate   LOS: 9 days    Myles Mallicoat K. 03/28/2014

## 2014-03-28 NOTE — Progress Notes (Signed)
Pt has progressed with therapy and they have signed off. No further therapy needs.[phone]

## 2014-03-28 NOTE — Progress Notes (Signed)
Report was called to RN on unit [place]. Patient will tx to room 6N Room 5

## 2014-03-28 NOTE — Progress Notes (Signed)
     Subjective:  Feels much better. No CP, no SOB. In chair. Eating breakfast, clears. Wants solids.   Objective:  Vital Signs in the last 24 hours: Temp:  [98 F (36.7 C)-98.4 F (36.9 C)] 98.1 F (36.7 C) (03/29 0400) Pulse Rate:  [72-96] 80 (03/28 1900) Resp:  [13-21] 21 (03/28 1900) BP: (101-122)/(28-53) 101/28 mmHg (03/29 0635) SpO2:  [92 %-96 %] 92 % (03/29 0400) Weight:  [119 lb 11.4 oz (54.3 kg)] 119 lb 11.4 oz (54.3 kg) (03/29 0500)  Intake/Output from previous day: 03/28 0701 - 03/29 0700 In: 55 [P.O.:980] Out: -    Physical Exam: General: Thin elderly, in no acute distress. Head:  Normocephalic and atraumatic. Lungs: Clear to auscultation and percussion. Heart: Normal S1 and S2.  No murmur, rubs or gallops.  Abdomen: soft, non-tender, positive bowel sounds. Extremities: No clubbing or cyanosis. No edema. Neurologic: Alert and oriented x 3.    Lab Results:  Recent Labs  03/27/14 0315 03/28/14 0241  WBC 10.3 9.2  HGB 13.1 13.5  PLT 216 217    Recent Labs  03/26/14 0301 03/27/14 0315  NA 142 139  K 2.8* 4.0  CL 103 104  CO2 23 24  GLUCOSE 103* 107*  BUN 23 21  CREATININE 0.42* 0.44*   Telemetry: No adverse rhythms Personally viewed.    Cardiac Studies:  EF 25% Cath no CAD, ?Taketsubo  Assessment/Plan:  Active Problems:   SBO (small bowel obstruction)   Acute anterior myocardial infarction   Acute combined systolic and diastolic heart failure   Congestive dilated cardiomyopathy   Acute respiratory failure with hypoxia   Malnutrition of moderate degree   - Stopping amlodipine  - Starting coreg 3.125mg  BID  - Continue ARB  - Stopping Hydralazine   - With stress induced cardiomyopathy Leonie Man), hopeful improvement in EF over next several days. If chronic cardiomyopathy, EF may not improve.   - No lasix at this time.   - Will see her in one week in clinic. I have sent staff message to Ms Rosana Hoes to sched appt.   - Reassess EF in  90 days.    - Comfortable with DC home from cardiac perspective. Gen surg team primary.    SKAINS, [place] 03/28/2014, 8:53 AM

## 2014-03-29 LAB — CBC
HEMATOCRIT: 35.7 % — AB (ref 36.0–46.0)
HEMOGLOBIN: 12.4 g/dL (ref 12.0–15.0)
MCH: 34.2 pg — ABNORMAL HIGH (ref 26.0–34.0)
MCHC: 34.7 g/dL (ref 30.0–36.0)
MCV: 98.3 fL (ref 78.0–100.0)
Platelets: 201 10*3/uL (ref 150–400)
RBC: 3.63 MIL/uL — AB (ref 3.87–5.11)
RDW: 13 % (ref 11.5–15.5)
WBC: 8.4 10*3/uL (ref 4.0–10.5)

## 2014-03-29 MED ORDER — TELMISARTAN 80 MG PO TABS: 80.0000 mg | ORAL_TABLET | Freq: Every day | ORAL | Status: DC

## 2014-03-29 MED ORDER — CARVEDILOL 3.125 MG PO TABS: 3.1250 mg | ORAL_TABLET | Freq: Two times a day (BID) | ORAL | Status: DC

## 2014-03-29 MED ORDER — HYDROCODONE-ACETAMINOPHEN 5-325 MG PO TABS: 1.0000 | ORAL_TABLET | ORAL | Status: DC | PRN

## 2014-03-29 NOTE — Discharge Summary (Signed)
Patient ID: Kimberly Jordan MRN: [id number] DOB/AGE: Jun 25, 1929 80 y.o.  Admit date: 03/19/2014 Discharge date: 03/29/2014  Procedures: exploratory laparotomy on 03-22-14 Cardiac catheretization on 03-25-14   Consults: cardiology and pulmonary/intensive care  Reason for Admission: 80 y/o woman, and former [employer] [place], was at her baseline of health until about 2AM this morning when she developed pain in her abdomen, and said it felt like a balloon was in there. She then started vomiting and this was followed by dry heaves. She did not improve and came to the ER where CT scan shows a high-grade small bowel obstruction with transition point locatedwithin the right lower abdominal quadrant. The etiology of the obstruction is not depicted on this examination and thus is presumably secondary to adhesions. No evidence of perforation or drainable fluid collection.2. Age-indeterminate moderate (approximately 50%) compressiondeformity involving the superior endplate of the L3 vertebral body. She remains distended in the ER, she is not nauseated, but still having some abdominal discomfort. We have attempted to place an NG several times in the ER but it seems to be going to her lung, we are asking IR to place.   Admission Diagnoses:  1. SBO with hx of total abdominal hysterectomy bilateral SOO 1994  2. Hypertension  3. Hx of right breast cancer  4. Blepharitis  5. Dyslipidemia  6. Osteoporosis  7. Arthritis   Hospital Course: The patient was admitted and an NGT was placed for her SBO.  She was treated conservatively for 3 days without improvement.  Finally, on HD 4, she was taken to the operating room where she underwent an exploratory laparotomy with LOA.  She tolerated this well.  She had an ileus for 3 days and on Day 4, she began passing lots of flatus and her NGT was removed.  She was able to have diet advanced as tolerated after that.  She had PTand OT working with her post operatively as  well.  She needed no further PT follow up at time on discharge on POD 7.  On POD 3, the patient became acutely hypoxic, tachycardic, with SOB.  She was noted to have some changes to her EKG and there was concern for a STEMI.  Cardiology was consulted.  She was [facility].  This was clean except for severely decreased LV systolic function.  This was noted to be 20%.  She was felt to have Takatsubo cardiomyopathy leading to her symptoms as opposed to a STEMI.  She was put on coreg and an ARB.  She was dc home on these as well.  She was doing well from a surgical and cardiac standpoint on POD 7.  She was stable for dc home with short-term follow up later this week for staple removal from her abdomen.  Discharge Diagnoses:  Active Problems:   SBO (small bowel obstruction) Takatsubo cadiomyopathy   Acute combined systolic and diastolic heart failure   Congestive dilated cardiomyopathy   Acute respiratory failure with hypoxia   Malnutrition of moderate degree s/p ex lap with LOA  Discharge Medications:   Medication List    STOP taking these medications       doxycycline 100 MG capsule  Commonly known as:  VIBRAMYCIN     potassium chloride 10 MEQ tablet  Commonly known as:  K-DUR     telmisartan-hydrochlorothiazide 80-12.5 MG per tablet  Commonly known as:  MICARDIS HCT      TAKE these medications       amLODipine  5 MG tablet  Commonly known as:  NORVASC  Take 5 mg by mouth daily.     aspirin 81 MG tablet  Take 81 mg by mouth daily.     CALCIUM + D PO  Take 600 mg by mouth 3 (three) times daily.     carvedilol 3.125 MG tablet  Commonly known as:  COREG  Take 1 tablet (3.125 mg total) by mouth 2 (two) times daily with a meal.     CLEAR EYES NATURAL TEARS 5-6 MG/ML Soln  Generic drug:  Polyvinyl Alcohol-Povidone  Place 2 drops into both eyes daily as needed.     clobetasol cream 0.05 %  Commonly known as:  TEMOVATE  Apply 1 application topically as  needed (For itching on back).     FORTEO 600 MCG/2.4ML Soln  Generic drug:  Teriparatide (Recombinant)  Inject 20 mcg into the skin daily.     HYDROcodone-acetaminophen 5-325 MG per tablet  Commonly known as:  NORCO/VICODIN  Take 1-2 tablets by mouth every 4 (four) hours as needed for moderate pain.     simvastatin 20 MG tablet  Commonly known as:  ZOCOR  Take 20 mg by mouth daily.     SYSTANE BALANCE 0.6 % Soln  Generic drug:  Propylene Glycol  Place 1 drop into both eyes daily as needed.     telmisartan 80 MG tablet  Commonly known as:  MICARDIS  Take 1 tablet (80 mg total) by mouth daily.        Discharge Instructions:     Follow-up Information   Follow up with [facility]. (our nurses will call you with appointment time)    Contact information:   [place] [address] [phone]      Follow up with Merrie Roof, MD. Schedule an appointment as soon as possible for a visit in 3 weeks.   Specialty:  General Surgery   Contact information:   [address] [place] [address] [phone]       Follow up with Candee Furbish, MD. (their office will call you as well)    Specialty:  Cardiology   Contact information:   [address] [place] [address] [phone]       Signed: Henreitta Cea 03/29/2014, 10:37 AM

## 2014-03-29 NOTE — Discharge Instructions (Signed)
[facility]      [facility] [phone]  OPEN ABDOMINAL SURGERY: POST OP INSTRUCTIONS  Always review your discharge instruction sheet given to you by the facility where your surgery was performed.  IF YOU HAVE DISABILITY OR FAMILY LEAVE FORMS, YOU MUST BRING THEM TO THE OFFICE FOR PROCESSING.  PLEASE DO NOT GIVE THEM TO YOUR DOCTOR.  1. A prescription for pain medication may be given to you upon discharge.  Take your pain medication as prescribed, if needed.  If narcotic pain medicine is not needed, then you may take acetaminophen (Tylenol) or ibuprofen (Advil) as needed. 2. Take your usually prescribed medications unless otherwise directed. 3. If you need a refill on your pain medication, please contact your pharmacy. They will contact our office to request authorization.  Prescriptions will not be filled after 5pm or on week-ends. 4. You should follow a light diet the first few days after arrival home, such as soup and crackers, pudding, etc.unless your doctor has advised otherwise. A high-fiber, low fat diet can be resumed as tolerated.   Be sure to include lots of fluids daily. Most patients will experience some swelling and bruising on the chest and neck area.  Ice packs will help.  Swelling and bruising can take several days to resolve 5. Most patients will experience some swelling and bruising in the area of the incision. Ice pack will help. Swelling and bruising can take several days to resolve..  6. It is common to experience some constipation if taking pain medication after surgery.  Increasing fluid intake and taking a stool softener will usually help or prevent this problem from occurring.  A mild laxative (Milk of Magnesia or Miralax) should be taken according to package directions if there are no bowel movements after 48 hours. 7.  You may have steri-strips (small skin tapes) in place directly over the incision.  These strips should be left on the skin for 7-10 days.  If your  surgeon used skin glue on the incision, you may shower in 24 hours.  The glue will flake off over the next 2-3 weeks.  Any sutures or staples will be removed at the office during your follow-up visit. You may find that a light gauze bandage over your incision may keep your staples from being rubbed or pulled. You may shower and replace the bandage daily. 8. ACTIVITIES:  You may resume regular (light) daily activities beginning the next day--such as daily self-care, walking, climbing stairs--gradually increasing activities as tolerated.  You may have sexual intercourse when it is comfortable.  Refrain from any heavy lifting or straining until approved by your doctor. a. You may drive when you no longer are taking prescription pain medication, you can comfortably wear a seatbelt, and you can safely maneuver your car and apply brakes b. Return to Work: ___________________________________ 9. You should see your doctor in the office for a follow-up appointment approximately two weeks after your surgery.  Make sure that you call for this appointment within a day or two after you arrive home to insure a convenient appointment time. OTHER INSTRUCTIONS:  _____________________________________________________________ _____________________________________________________________  WHEN TO CALL YOUR DOCTOR: 1. Fever over 101.0 2. Inability to urinate 3. Nausea and/or vomiting 4. Extreme swelling or bruising 5. Continued bleeding from incision. 6. Increased pain, redness, or drainage from the incision. 7. Difficulty swallowing or breathing 8. Muscle cramping or spasms. 9. Numbness or tingling in hands or feet or around lips.  The clinic staff is available to   answer your questions during regular business hours.  Please don't hesitate to call and ask to speak to one of the nurses if you have concerns.  For further questions, please visit [email or web address]   

## 2014-03-29 NOTE — Progress Notes (Signed)
Occupational Therapy Treatment Patient Details Name: Kimberly Jordan MRN: [id number] DOB: April 04, 1929 Today's Date: 03/29/2014    History of present illness 80 y/o developed pain in her abdomen, and said it felt like a balloon was in there.  She then started vomiting and this was followed by dry heaves.  She did not improve and came to the ER where CT scan shows a high-grade small bowel obstruction with transition point locatedwithin the right lower abdominal quadrant. 3/23 s/p EXPLORATORY LAPAROTOMY . MI noted on 03/25/14 s/p left heart catherization   OT comments  Pt. Is Mod I with ADLs and mobility. Pt. Did have 1 slight LOB when standing up from commode with pt. Able to recover without A. Pt. Was ed. On safe technique for tub transfer with seat and was able to verbalize understanding. Pt. Is to d/c from hospital today and OT goals are met.   Follow Up Recommendations  No OT follow up    Equipment Recommendations  None recommended by OT    Recommendations for Other Services      Precautions / Restrictions Precautions Precautions: Fall       Mobility Bed Mobility Overal bed mobility: Modified Independent Bed Mobility: Supine to Sit     Supine to sit: Modified independent (Device/Increase time) Sit to supine: Modified independent (Device/Increase time)      Transfers Overall transfer level: Modified independent                    Balance                                   ADL           Lower Body Dressing: Independent Toilet Transfer: Modified Independent;Comfort height toilet Toileting- Clothing Manipulation and Hygiene: Independent   Functional mobility during ADLs: Modified independent General ADL Comments: Pt. doing well with performing ADLs. Pt. was slightly unsteady when standing up from toilet but was able to recover balance.      Vision                     Perception     Praxis      Cognition   Behavior During  Therapy: WFL for tasks assessed/performed Overall Cognitive Status: Within Functional Limits for tasks assessed                       Extremity/Trunk Assessment               Exercises       General Comments      Pertinent Vitals/ Pain       No c/o pain  Home Living                                          Prior Functioning/Environment              Frequency       Progress Toward Goals  OT Goals(current goals can now be found in the care plan section)  Progress towards OT goals: Goals met/education completed, patient discharged from [facility] Discharge plan remains appropriate;All goals met and education completed, patient discharged from OT services    End of Session    Activity Tolerance Patient tolerated treatment  well   Patient Left in bed;with call bell/phone within reach;with family/visitor present   Nurse Communication          Time: 7127-8718 OT Time Calculation (min): 23 min  Charges: OT General Charges $OT Visit: 1 Procedure  Kimberly Jordan 03/29/2014, 12:50 PM

## 2014-03-31 ENCOUNTER — Telehealth (INDEPENDENT_AMBULATORY_CARE_PROVIDER_SITE_OTHER): Payer: Self-pay

## 2014-03-31 ENCOUNTER — Ambulatory Visit (HOSPITAL_COMMUNITY)
Admission: RE | Admit: 2014-03-31 | Discharge: 2014-03-31 | Disposition: A | Discharge status: Home / Self Care | Payer: Medicare PPO | Source: Ambulatory Visit | Attending: General Surgery | Admitting: General Surgery

## 2014-03-31 ENCOUNTER — Telehealth (INDEPENDENT_AMBULATORY_CARE_PROVIDER_SITE_OTHER): Payer: Self-pay | Admitting: *Deleted

## 2014-03-31 DIAGNOSIS — M7989 Other specified soft tissue disorders: Secondary | ICD-10-CM

## 2014-03-31 NOTE — Telephone Encounter (Signed)
Pt called and given appt info at [facility] today at 3:00.

## 2014-03-31 NOTE — Telephone Encounter (Signed)
No precert required for Lower Extremity Venous Duplex Bilateral.  Pt appt 4.1.15 @ 3:00.  Pt is to report to [facility].  Kimberly Jordan made pt aware of apt.  Anderson Malta

## 2014-03-31 NOTE — Telephone Encounter (Signed)
Kimberly Jordan at vascular lab called report. Bilateral lower extremity doppler negative for DVT. Bakers cyst behind right knee. Pt will be sent home for further recommendations.

## 2014-03-31 NOTE — Progress Notes (Signed)
Bilateral lower extremity venous duplex:  No evidence of DVT or superficial thrombosis.  There appears to be a Baker's cyst in the right popliteal fossa.

## 2014-03-31 NOTE — Telephone Encounter (Signed)
Pt called in with symptoms of bilateral ankle and leg swelling s/p exploratory laparotomy. Dr Marlou Starks unavailable so I ran this by Dr Hassell Done. He suggests we order a venous duplex. Order in epic and we will call pt with appt info.

## 2014-04-01 ENCOUNTER — Encounter (INDEPENDENT_AMBULATORY_CARE_PROVIDER_SITE_OTHER): Payer: Self-pay

## 2014-04-01 ENCOUNTER — Ambulatory Visit (INDEPENDENT_AMBULATORY_CARE_PROVIDER_SITE_OTHER): Payer: Medicare PPO

## 2014-04-01 VITALS — BP 116/68 | HR 66 | Temp 98.1°F | Resp 16 | Wt 115.8 lb

## 2014-04-01 DIAGNOSIS — Z4802 Encounter for removal of sutures: Secondary | ICD-10-CM

## 2014-04-01 NOTE — Progress Notes (Signed)
Patient comes into office today for staple removal per [place], [place].  Patient s/p Exploratory Laparotomy on 03/22/14.  Patient reports that she's doing well since surgery.  Patient reports that she's eating well, having regular bowel movements.  Patient denies any fevers, nausea or vomiting. Patients incision appears to be healing well, without any redness, swelling or drainage.  Proceeded to remove staples.  Patient incision intact, steri-strips placed over intact incision.  Patient tolerated well.  Patient scheduled for post op appointment on 04/13/14 @ 11:00am w/Dr. Marlou Starks.

## 2014-04-02 ENCOUNTER — Telehealth: Payer: Self-pay | Admitting: Cardiology

## 2014-04-02 NOTE — Telephone Encounter (Signed)
New message    Attaching message from Dr. Marlou Porch - patient has appt on  4/7 @ 2 pm with Tera Helper.   One week transition of care hospital visit please.   Elta Guadeloupe

## 2014-04-06 ENCOUNTER — Encounter: Payer: Self-pay | Admitting: Nurse Practitioner

## 2014-04-06 ENCOUNTER — Ambulatory Visit (INDEPENDENT_AMBULATORY_CARE_PROVIDER_SITE_OTHER): Payer: Medicare PPO | Admitting: Nurse Practitioner

## 2014-04-06 VITALS — BP 122/70 | HR 80 | Ht 60.0 in | Wt 106.8 lb

## 2014-04-06 DIAGNOSIS — R0989 Other specified symptoms and signs involving the circulatory and respiratory systems: Secondary | ICD-10-CM

## 2014-04-06 DIAGNOSIS — R0600 Dyspnea, unspecified: Secondary | ICD-10-CM

## 2014-04-06 DIAGNOSIS — R0609 Other forms of dyspnea: Secondary | ICD-10-CM

## 2014-04-06 DIAGNOSIS — I428 Other cardiomyopathies: Secondary | ICD-10-CM

## 2014-04-06 DIAGNOSIS — I429 Cardiomyopathy, unspecified: Secondary | ICD-10-CM

## 2014-04-06 DIAGNOSIS — I5181 Takotsubo syndrome: Secondary | ICD-10-CM

## 2014-04-06 LAB — BASIC METABOLIC PANEL
BUN: 15 mg/dL (ref 6–23)
CO2: 31 mEq/L (ref 19–32)
Calcium: 10.2 mg/dL (ref 8.4–10.5)
Chloride: 98 mEq/L (ref 96–112)
Creatinine, Ser: 0.6 mg/dL (ref 0.4–1.2)
GFR: 97.84 mL/min (ref >60.00)
Glucose, Bld: 99 mg/dL (ref 70–99)
Potassium: 4.3 mEq/L (ref 3.5–5.1)
Sodium: 136 mEq/L (ref 135–145)

## 2014-04-06 LAB — CBC
HCT: 37.5 % (ref 36.0–46.0)
Hemoglobin: 12.8 g/dL (ref 12.0–15.0)
MCHC: 34.2 g/dL (ref 30.0–36.0)
MCV: 100.1 fl — ABNORMAL HIGH (ref 78.0–100.0)
Platelets: 282 10*3/uL (ref 150.0–400.0)
RBC: 3.75 Mil/uL — ABNORMAL LOW (ref 3.87–5.11)
RDW: 13.2 % (ref 11.5–14.6)
WBC: 7.1 10*3/uL (ref 4.5–10.5)

## 2014-04-06 LAB — BRAIN NATRIURETIC PEPTIDE: Pro B Natriuretic peptide (BNP): 80 pg/mL (ref 0.0–100.0)

## 2014-04-06 MED ORDER — TELMISARTAN 80 MG PO TABS: 80.0000 mg | ORAL_TABLET | Freq: Every day | ORAL | Status: DC

## 2014-04-06 MED ORDER — CARVEDILOL 3.125 MG PO TABS: 3.1250 mg | ORAL_TABLET | Freq: Two times a day (BID) | ORAL | Status: DC

## 2014-04-06 NOTE — Patient Instructions (Addendum)
Go by this list of medicines  You will need to go to the drug store to pick up the Coreg 3.125 mg two times a day and the Micardis 80 mg daily  Do not take your MicardisHct or your Norvasc  We will check lab today  See Dr. Marlou Porch in 2 to 3 weeks  Call the [place] office at [phone] if you have any questions, problems or concerns.

## 2014-04-06 NOTE — Progress Notes (Signed)
[name] Date of Birth: May 31, 1929 Medical Record #[id number]  History of Present Illness: Ms. Kutzer is seen back today for a post hospital visit/TOC. Seen for Dr. Marlou Porch. She is an 80 year old former [employer]. She has a history of HTN, right breast cancer, blepharitis, HLD, OA, osteoporosis and total abdominal hysterectomy back in 1994.  She most recently presented with a SBO - no improvement with conservative management - had exp lap with lysis of adhesions. On POD 3 she had acute hypoxia, tachycardia and was short of breath. Cardiology called. Transferred to [facility] due to concern for STEMI - was cathed - no CAD but has EF of 20% - felt to have Takotsubo. Coreg and ARB were started.  Comes in today. Here alone. Says she is doing ok. Feels good. No problems. Bowels working ok. She is quite happy about that. Not short of breath. Did have some swelling in her legs - negative doppler and now her swelling is all gone. She is quite mixed up about her medicines. Says she was to go to the drug store - not sure why and for what. Really can't tell me any of her medicines. Not dizzy or lightheaded.  Not really aware about need for salt restriction, weights, etc.  We called the drug store - never picked up her Coreg. Probably still on her Micardis HCT.  Current Outpatient Prescriptions  Medication Sig Dispense Refill  . amLODipine (NORVASC) 5 MG tablet Take 5 mg by mouth daily.      Marland Kitchen aspirin 81 MG tablet Take 81 mg by mouth daily.      . Calcium Carbonate-Vitamin D (CALCIUM + D PO) Take 600 mg by mouth 3 (three) times daily.      . carvedilol (COREG) 3.125 MG tablet Take 1 tablet (3.125 mg total) by mouth 2 (two) times daily with a meal.  60 tablet  0  . clobetasol cream (TEMOVATE) 2.69 % Apply 1 application topically as needed (For itching on back).       . doxycycline (VIBRA-TABS) 100 MG tablet Take 100 mg by mouth daily.       Marland Kitchen FORTEO 600 MCG/2.4ML SOLN Inject 20 mcg into the skin  daily.       Marland Kitchen HYDROcodone-acetaminophen (NORCO/VICODIN) 5-325 MG per tablet Take 1-2 tablets by mouth every 4 (four) hours as needed for moderate pain.  30 tablet  0  . Polyvinyl Alcohol-Povidone (CLEAR EYES NATURAL TEARS) 5-6 MG/ML SOLN Place 2 drops into both eyes daily as needed.      . potassium chloride (K-DUR) 10 MEQ tablet Take 10 mEq by mouth 2 (two) times daily.       Marland Kitchen Propylene Glycol (SYSTANE BALANCE) 0.6 % SOLN Place 1 drop into both eyes daily as needed.      . simvastatin (ZOCOR) 20 MG tablet Take 20 mg by mouth daily.      Marland Kitchen telmisartan (MICARDIS) 80 MG tablet Take 1 tablet (80 mg total) by mouth daily.  30 tablet  0   No current facility-administered medications for this visit.    No Known Allergies  Past Medical History  Diagnosis Date  . Breast cancer 1992    right breast   . Hypertension   . Blepharitis   . Osteoporosis   . Ectopic pregnancy   . Colon polyp     Past Surgical History  Procedure Laterality Date  . Ectopic pregnancy surgery      salpingectomy  . Colonoscopy w/  polypectomy    . Hand surgery  2007    right hand-ruptured tendon  . Cataract extraction  2010, 2011    2010 right eye, 2011 left eye  . Cyst removed  2012    from right wrist  . Total abdominal hysterectomy w/ bilateral salpingoophorectomy  1994  . Knee arthroscopy  1998  . Laparotomy N/A 03/22/2014    Procedure: EXPLORATORY LAPAROTOMY;  Surgeon: Merrie Roof, MD;  Location: [facility];  Service: General;  Laterality: N/A;    History  Smoking status  . Former Smoker  Smokeless tobacco  . Never Used    History  Alcohol Use  . 3.0 oz/week  . 5 Glasses of wine per week    Comment: wine daily    Family History  Problem Relation Age of Onset  . Osteoporosis Mother   . CVA Father   . Hypertension Father   . Arthritis Mother   . Hypertension Mother     Review of Systems: The review of systems is per the HPI.  All other systems were reviewed and are negative.  Physical  Exam: BP 122/70  Pulse 80  Ht 5' (1.524 m)  Wt 106 lb 12.8 oz (48.444 kg)  BMI 20.86 kg/m2  SpO2 98%  LMP 12/31/1992 Patient is alert and in no acute distress. Skin is warm and dry. Color is normal.  HEENT is unremarkable. Normocephalic/atraumatic. PERRL. Sclera are nonicteric. Neck is supple. No masses. No JVD. Lungs are clear. Cardiac exam shows a regular rate and rhythm. S3 noted. Abdomen is soft. Extremities are without edema. Arms pretty bruised up. Gait and ROM are intact. No gross neurologic deficits noted.  Wt Readings from Last 3 Encounters:  04/06/14 106 lb 12.8 oz (48.444 kg)  04/01/14 115 lb 12.8 oz (52.527 kg)  03/29/14 116 lb 13.5 oz (53 kg)     LABORATORY DATA: PENDING  Lab Results  Component Value Date   WBC 8.4 03/29/2014   HGB 12.4 03/29/2014   HCT 35.7* 03/29/2014   PLT 201 03/29/2014   GLUCOSE 107* 03/27/2014   ALT 22 03/25/2014   AST 29 03/25/2014   NA 139 03/27/2014   K 4.0 03/27/2014   CL 104 03/27/2014   CREATININE 0.44* 03/27/2014   BUN 21 03/27/2014   CO2 24 03/27/2014   INR 1.12 03/20/2014     Assessment / Plan: 1. SBO - S/p exp lap for lysis of adhesions - seems to be doing well. Sees general surgery next week.   2. Takotsubo CM - EF of 20% - no symptoms. Looks compensated. Would like to get her on the Coreg. Stopping Norvasc today. Changing to plain Micardis 80 mg. I have given her a list of her medicines to follow. Recheck baseline labs today as well. Would plan for repeat echo at first of July. Hopefully her EF will recover.   3. HTN - BP is great. Will stop Norvasc, adding Coreg and changing to plain Micardis.  Patient is agreeable to this plan and will call if any problems develop in the interim.   Burtis Junes, RN, [place] [address] [place], [place]  [address] [phone]

## 2014-04-07 ENCOUNTER — Other Ambulatory Visit: Payer: Self-pay | Admitting: *Deleted

## 2014-04-07 MED ORDER — TELMISARTAN 80 MG PO TABS: 80.0000 mg | ORAL_TABLET | Freq: Every day | ORAL | Status: DC

## 2014-04-13 ENCOUNTER — Encounter (INDEPENDENT_AMBULATORY_CARE_PROVIDER_SITE_OTHER): Payer: Medicare PPO | Admitting: General Surgery

## 2014-05-04 ENCOUNTER — Encounter: Payer: Self-pay | Admitting: Cardiology

## 2014-05-04 ENCOUNTER — Encounter: Payer: Self-pay | Admitting: *Deleted

## 2014-05-04 ENCOUNTER — Ambulatory Visit (INDEPENDENT_AMBULATORY_CARE_PROVIDER_SITE_OTHER): Payer: Medicare PPO | Admitting: Cardiology

## 2014-05-04 VITALS — BP 140/62 | HR 80 | Ht 60.0 in | Wt 107.0 lb

## 2014-05-04 DIAGNOSIS — E43 Unspecified severe protein-calorie malnutrition: Secondary | ICD-10-CM

## 2014-05-04 DIAGNOSIS — I429 Cardiomyopathy, unspecified: Secondary | ICD-10-CM

## 2014-05-04 DIAGNOSIS — I428 Other cardiomyopathies: Secondary | ICD-10-CM

## 2014-05-04 DIAGNOSIS — I10 Essential (primary) hypertension: Secondary | ICD-10-CM

## 2014-05-04 MED ORDER — CARVEDILOL 6.25 MG PO TABS: 6.2500 mg | ORAL_TABLET | Freq: Two times a day (BID) | ORAL | Status: DC

## 2014-05-04 NOTE — Progress Notes (Signed)
[facility]. [address] [place], [place]  [address] Phone: [phone] Fax:  [phone]  Date:  05/04/2014   ID:  Kimberly Jordan, DOB 10-31-1929, MRN [id number]  PCP:  Tivis Ringer, MD   History of Present Illness: Kimberly Jordan is a 80 y.o. female former 21 of [place] with hospitalization in March of 2015 with small bowel obstruction and probable stress induced cardiomyopathy with ejection fraction of 20%. Typical morphology for this. Trying to up titrate beta blocker.  She was taken to the cardiac catheterization lab on an emergent basis because of fear of ST elevation myocardial infarction. She had no obstructive coronary disease.  Reviewed Cecille Rubin Gerhardt's note.   Wt Readings from Last 3 Encounters:  05/04/14 107 lb (48.535 kg)  04/06/14 106 lb 12.8 oz (48.444 kg)  04/01/14 115 lb 12.8 oz (52.527 kg)     Past Medical History  Diagnosis Date  . Breast cancer 1992    right breast   . Hypertension   . Blepharitis   . Osteoporosis   . Ectopic pregnancy   . Colon polyp     Past Surgical History  Procedure Laterality Date  . Ectopic pregnancy surgery      salpingectomy  . Colonoscopy w/ polypectomy    . Hand surgery  2007    right hand-ruptured tendon  . Cataract extraction  2010, 2011    2010 right eye, 2011 left eye  . Cyst removed  2012    from right wrist  . Total abdominal hysterectomy w/ bilateral salpingoophorectomy  1994  . Knee arthroscopy  1998  . Laparotomy N/A 03/22/2014    Procedure: EXPLORATORY LAPAROTOMY;  Surgeon: Merrie Roof, MD;  Location: [facility];  Service: General;  Laterality: N/A;    Current Outpatient Prescriptions  Medication Sig Dispense Refill  . amLODipine (NORVASC) 5 MG tablet       . aspirin 81 MG tablet Take 81 mg by mouth daily.      . BD PEN NEEDLE NANO U/F 32G X 4 MM MISC       . Calcium Carbonate-Vitamin D (CALCIUM + D PO) Take 600 mg by mouth 3 (three) times daily.      . carvedilol (COREG) 3.125 MG  tablet Take 1 tablet (3.125 mg total) by mouth 2 (two) times daily with a meal.  60 tablet  6  . clobetasol cream (TEMOVATE) 1.32 % Apply 1 application topically as needed (For itching on back).       . doxycycline (VIBRA-TABS) 100 MG tablet       . FORTEO 600 MCG/2.4ML SOLN Inject 20 mcg into the skin daily.       . Polyvinyl Alcohol-Povidone (CLEAR EYES NATURAL TEARS) 5-6 MG/ML SOLN Place 2 drops into both eyes daily as needed.      . potassium chloride (K-DUR) 10 MEQ tablet       . Propylene Glycol (SYSTANE BALANCE) 0.6 % SOLN Place 1 drop into both eyes daily as needed.      . simvastatin (ZOCOR) 20 MG tablet Take 20 mg by mouth daily.      Marland Kitchen telmisartan (MICARDIS) 80 MG tablet Take 1 tablet (80 mg total) by mouth daily.  30 tablet  6   No current facility-administered medications for this visit.    Allergies:   No Known Allergies  Social History:  The patient  reports that she has quit smoking. She has never used smokeless tobacco. She  reports that she drinks about 3 ounces of alcohol per week. She reports that she does not use illicit drugs.   Family History  Problem Relation Age of Onset  . Osteoporosis Mother   . CVA Father   . Hypertension Father   . Arthritis Mother   . Hypertension Mother     ROS:  Please see the history of present illness.  Denies any syncope, dizziness. Positive mild ankle edema. No orthopnea, no PND   All other systems reviewed and negative.   PHYSICAL EXAM: VS:  BP 140/62  Pulse 80  Ht 5' (1.524 m)  Wt 107 lb (48.535 kg)  BMI 20.90 kg/m2  LMP 12/31/1992 Well nourished, well developed, in no acute distress HEENT: normal, [place]/AT, EOMI Neck: no JVD, normal carotid upstroke, no bruit Cardiac:  normal S1, S2; RRR; no murmur Lungs:  clear to auscultation bilaterally, no wheezing, rhonchi or rales Abd: soft, nontender, no hepatomegaly, no bruits Ext: no edema, 2+ distal pulses Skin: warm and dry GU: deferred Neuro: no focal abnormalities noted, AAO  x 3  EKG:  None today  ASSESSMENT AND PLAN:  1. Takotsubo cardiomyopathy - working diagnoses. Hopeful resolution of EF. Uptitrate carvedilol to 6.25 twice a day. Continue with angiotensin receptor blocker. We will check echocardiogram early July. I will see her back in 2 months. 2. Hypertension-increasing carvedilol. 3. Protein malnutrition-it is likely that her albumin is slightly low causing some of the ankle edema. Her weight is decreasing. Continue to encourage adequate diet. History of small bowel obstruction.  Signed, Candee Furbish, MD [facility]  05/04/2014 3:07 PM

## 2014-05-04 NOTE — Patient Instructions (Addendum)
Your physician recommends that you schedule a follow-up appointment in: 2  MONTHS WITH DR Marlou Porch  INCREASE CARVEDILOL TO 6.25 MG TWICE DAILY= TWO  3.125 MG TABLETS TWICE DAILY  Your physician has requested that you have an echocardiogram. Echocardiography is a painless test that uses sound waves to create images of your heart. It provides your doctor with information about the size and shape of your heart and how well your heart's chambers and valves are working. This procedure takes approximately one hour. There are no restrictions for this procedure.SCHEDULE PRIOR TO APPOINTMENT IN 2 MONTHS

## 2014-05-07 ENCOUNTER — Encounter (INDEPENDENT_AMBULATORY_CARE_PROVIDER_SITE_OTHER): Payer: Self-pay | Admitting: General Surgery

## 2014-05-07 ENCOUNTER — Ambulatory Visit (INDEPENDENT_AMBULATORY_CARE_PROVIDER_SITE_OTHER): Payer: Medicare PPO | Admitting: General Surgery

## 2014-05-07 VITALS — BP 118/60 | HR 80 | Temp 97.3°F | Resp 12 | Wt 106.3 lb

## 2014-05-07 DIAGNOSIS — K56609 Unspecified intestinal obstruction, unspecified as to partial versus complete obstruction: Secondary | ICD-10-CM

## 2014-05-07 NOTE — Patient Instructions (Signed)
May return to normal activities 

## 2014-05-07 NOTE — Progress Notes (Signed)
Subjective:     Patient ID: Kimberly Jordan, female   DOB: 05/02/1929, 80 y.o.   MRN: [id number]  HPI The patient is an 80 year old white female who is about 6 weeks status post exploratory laparotomy and lysis of an adhesion for a small bowel obstruction. She is doing well now. She denies any abdominal pain. Her appetite is good and her bowels are working normally.  Review of Systems     Objective:   Physical Exam On exam her abdomen is soft and nontender. Her incision has healed nicely with no sign of infection.    Assessment:     The patient is 6 weeks status post lysis of adhesions     Plan:     At this point she may return to her normal activities without restriction. I will plan to see her back on a when necessary basis.

## 2014-06-02 ENCOUNTER — Encounter: Payer: Self-pay | Admitting: Cardiology

## 2014-06-14 ENCOUNTER — Ambulatory Visit: Payer: Medicare PPO | Admitting: Family Medicine

## 2014-06-15 ENCOUNTER — Ambulatory Visit (INDEPENDENT_AMBULATORY_CARE_PROVIDER_SITE_OTHER): Payer: Medicare PPO | Admitting: Family Medicine

## 2014-06-15 ENCOUNTER — Encounter: Payer: Self-pay | Admitting: Family Medicine

## 2014-06-15 VITALS — BP 120/68 | HR 65 | Ht 60.0 in | Wt 104.0 lb

## 2014-06-15 DIAGNOSIS — M1710 Unilateral primary osteoarthritis, unspecified knee: Secondary | ICD-10-CM

## 2014-06-15 NOTE — Progress Notes (Signed)
  [facility] [place], [place] [address] Phone: [phone] Subjective:     CC: Bilateral knee pain  [id number] Kimberly Jordan is a 80 y.o. female coming in with complaint of bilateral knee pain. Patient has had a history of an arthroscopic procedure done on her right knee previously. Patient has been told that she does have arthritis. We do not have any records of this. Patient states that she is still able to do all activities of daily living and is able to do walking. Patient does have some mild discomfort of the knees from time to time. Patient has only tried some over-the-counter medicines on an as-needed basis. Patient denies ever stops her. Patient denies any nighttime awakening. Denies any radiation of pain or numbness or tingling states that the pain is generalized and no specific area where it hurts. Activity does seem to make her worse includes going upstairs. Patient rates the severity of 3/10.      Past medical history, social, surgical and family history all reviewed in electronic medical record.   Review of Systems: No headache, visual changes, nausea, vomiting, diarrhea, constipation, dizziness, abdominal pain, skin rash, fevers, chills, night sweats, weight loss, swollen lymph nodes, body aches, joint swelling, muscle aches, chest pain, shortness of breath, mood changes.   Objective Blood pressure 120/68, pulse 65, height 5' (1.524 m), weight 104 lb (47.174 kg), last menstrual period 12/31/1992, SpO2 96.00%.  General: No apparent distress alert and oriented x3 mood and affect normal, dressed appropriately.  HEENT: Pupils equal, extraocular movements intact  Respiratory: Patient's speak in full sentences and does not appear short of breath  Cardiovascular: No lower extremity edema, non tender, no erythema  Skin: Warm dry intact with no signs of infection or rash on extremities or on axial skeleton.  Abdomen: Soft nontender    Neuro: Cranial nerves II through XII are intact, neurovascularly intact in all extremities with 2+ DTRs and 2+ pulses.  Lymph: No lymphadenopathy of posterior or anterior cervical chain or axillae bilaterally.  Gait normal with good balance and coordination.  MSK:  Non tender with full range of motion and good stability and symmetric strength and tone of shoulders, elbows, wrist, hip, and ankles bilaterally.  Knee: Bilateral Normal to inspection with no erythema or effusion or obvious bony abnormalities. Patient is no specific joint line tenderness noted today. ROM full in flexion and extension and lower leg rotation. Patient does have extreme laxity with likely a rupture of the right anterior cruciate ligament. Left anterior cruciate ligament intact. Negative Mcmurray's, Apley's, and Thessalonian tests. File painful patellar compression. Patellar glide with moderate to severe crepitus bilaterally. Patellar and quadriceps tendons unremarkable. Hamstring and quadriceps strength is normal.      Impression and Recommendations:     This case required medical decision making of moderate complexity.

## 2014-06-15 NOTE — Assessment & Plan Note (Signed)
Patient does have likely osteophytic changes. Consider getting x-rays today which patient declined. I do agree with her doing well with conservative therapy so far. This would not change any type of management and further imaging was given at this time. We discussed over-the-counter medications he could be beneficial and patient was given home at showed program to try to strengthen the VMO for protection of the knee. We discussed which activity would be good which activities could cause more discomfort. We attempted bracing which patient stated made it feel worse. Patient is going to try these interventions and come back again in 3-4 weeks for further evaluation.

## 2014-06-15 NOTE — Patient Instructions (Signed)
Good to see you Exercises 3 times a week Ice 20 minutes after activity.  Take tylenol 650 mg three times a day is the best evidence based medicine we have for arthritis.  Glucosamine sulfate 750mg  twice a day is a supplement that has been shown to help moderate to severe arthritis. Vitamin D 2000 IU daily Fish oil 2 grams daily.  Tumeric 500mg  twice daily.  Capsaicin topically up to four times a day may also help with pain. Cortisone injections are an option if these interventions do not seem to make a difference or need more relief.  If cortisone injections do not help, there are different types of shots that may help but they take longer to take effect.  We can discuss this at follow up.  It's important that you continue to stay active. Controlling your weight is important.  Shoe inserts with good arch support may be helpful.  [employer] orthotics at [employer] could help.  Water aerobics and cycling with low resistance are the best two types of exercise for arthritis. Come back and see me in 3-4 weeks.

## 2014-07-06 ENCOUNTER — Ambulatory Visit (HOSPITAL_COMMUNITY): Payer: Medicare PPO | Attending: Cardiology | Admitting: Radiology

## 2014-07-06 DIAGNOSIS — I519 Heart disease, unspecified: Secondary | ICD-10-CM | POA: Insufficient documentation

## 2014-07-06 DIAGNOSIS — R609 Edema, unspecified: Secondary | ICD-10-CM | POA: Insufficient documentation

## 2014-07-06 DIAGNOSIS — I059 Rheumatic mitral valve disease, unspecified: Secondary | ICD-10-CM | POA: Insufficient documentation

## 2014-07-06 DIAGNOSIS — I429 Cardiomyopathy, unspecified: Secondary | ICD-10-CM

## 2014-07-06 DIAGNOSIS — I428 Other cardiomyopathies: Secondary | ICD-10-CM

## 2014-07-06 DIAGNOSIS — I420 Dilated cardiomyopathy: Secondary | ICD-10-CM

## 2014-07-06 DIAGNOSIS — I10 Essential (primary) hypertension: Secondary | ICD-10-CM | POA: Insufficient documentation

## 2014-07-06 NOTE — Progress Notes (Signed)
Echocardiogram performed.  

## 2014-07-15 ENCOUNTER — Encounter: Payer: Self-pay | Admitting: Cardiology

## 2014-07-15 ENCOUNTER — Ambulatory Visit (INDEPENDENT_AMBULATORY_CARE_PROVIDER_SITE_OTHER): Payer: Medicare PPO | Admitting: Cardiology

## 2014-07-15 VITALS — BP 137/60 | HR 63 | Ht 60.0 in | Wt 103.0 lb

## 2014-07-15 DIAGNOSIS — I429 Cardiomyopathy, unspecified: Secondary | ICD-10-CM

## 2014-07-15 DIAGNOSIS — I10 Essential (primary) hypertension: Secondary | ICD-10-CM

## 2014-07-15 DIAGNOSIS — I428 Other cardiomyopathies: Secondary | ICD-10-CM

## 2014-07-15 DIAGNOSIS — E43 Unspecified severe protein-calorie malnutrition: Secondary | ICD-10-CM

## 2014-07-15 DIAGNOSIS — I420 Dilated cardiomyopathy: Secondary | ICD-10-CM

## 2014-07-15 MED ORDER — CARVEDILOL 12.5 MG PO TABS: 12.5000 mg | ORAL_TABLET | Freq: Two times a day (BID) | ORAL | Status: DC

## 2014-07-15 NOTE — Patient Instructions (Signed)
Please increase your Carvedilol to 12.5 mg one tablet twice a day. Continue all other medications as listed.  See Truitt Merle, NP in 2 months.

## 2014-07-15 NOTE — Progress Notes (Signed)
[facility]. [address], [address] [place], [place]  [address] Phone: [phone] Fax:  [phone]  Date:  07/15/2014   ID:  Kimberly Jordan, DOB 05/20/1929, MRN [id number]  PCP:  Tivis Ringer, MD   History of Present Illness: Kimberly Jordan is a 80 y.o. female former 58 of [place] with hospitalization in March of 2015 with small bowel obstruction and probable stress induced cardiomyopathy with ejection fraction of 20%. Typical morphology for this. Trying to up titrate beta blocker.  She was taken to the cardiac catheterization lab on an emergent basis because of fear of ST elevation myocardial infarction. She had no obstructive coronary disease.  No SOB, no CP. No orthopnea. Taking care of husband.    Wt Readings from Last 3 Encounters:  07/15/14 103 lb (46.72 kg)  06/15/14 104 lb (47.174 kg)  05/07/14 106 lb 4.8 oz (48.217 kg)     Past Medical History  Diagnosis Date  . Breast cancer 1992    right breast   . Hypertension   . Blepharitis   . Osteoporosis   . Ectopic pregnancy   . Colon polyp     Past Surgical History  Procedure Laterality Date  . Ectopic pregnancy surgery      salpingectomy  . Colonoscopy w/ polypectomy    . Hand surgery  2007    right hand-ruptured tendon  . Cataract extraction  2010, 2011    2010 right eye, 2011 left eye  . Cyst removed  2012    from right wrist  . Total abdominal hysterectomy w/ bilateral salpingoophorectomy  1994  . Knee arthroscopy  1998  . Laparotomy N/A 03/22/2014    Procedure: EXPLORATORY LAPAROTOMY;  Surgeon: Merrie Roof, MD;  Location: [facility];  Service: General;  Laterality: N/A;    Current Outpatient Prescriptions  Medication Sig Dispense Refill  . aspirin 81 MG tablet Take 81 mg by mouth daily.      . BD PEN NEEDLE NANO U/F 32G X 4 MM MISC       . Calcium Carbonate-Vitamin D (CALCIUM + D PO) Take 600 mg by mouth 3 (three) times daily.      . carvedilol (COREG) 6.25 MG tablet Take 1 tablet (6.25 mg  total) by mouth 2 (two) times daily with a meal.  60 tablet  6  . clobetasol cream (TEMOVATE) 4.40 % Apply 1 application topically as needed (For itching on back).       . doxycycline (VIBRA-TABS) 100 MG tablet as directed.       Marland Kitchen FORTEO 600 MCG/2.4ML SOLN Inject 20 mcg into the skin daily.       . Polyvinyl Alcohol-Povidone (CLEAR EYES NATURAL TEARS) 5-6 MG/ML SOLN Place 2 drops into both eyes daily as needed.      . potassium chloride (K-DUR) 10 MEQ tablet       . Propylene Glycol (SYSTANE BALANCE) 0.6 % SOLN Place 1 drop into both eyes daily as needed.      . simvastatin (ZOCOR) 20 MG tablet Take 20 mg by mouth daily.      Marland Kitchen telmisartan (MICARDIS) 80 MG tablet Take 1 tablet (80 mg total) by mouth daily.  30 tablet  6   No current facility-administered medications for this visit.    Allergies:   No Known Allergies  Social History:  The patient  reports that she has quit smoking. She has never used smokeless tobacco. She reports that she drinks about  3 ounces of alcohol per week. She reports that she does not use illicit drugs.   Family History  Problem Relation Age of Onset  . Osteoporosis Mother   . CVA Father   . Hypertension Father   . Arthritis Mother   . Hypertension Mother     ROS:  Please see the history of present illness.  Denies any syncope, dizziness. Positive mild ankle edema. No orthopnea, no PND   All other systems reviewed and negative.   PHYSICAL EXAM: VS:  BP 137/60  Pulse 63  Ht 5' (1.524 m)  Wt 103 lb (46.72 kg)  BMI 20.12 kg/m2  LMP 12/31/1992 Well nourished, well developed, in no acute distress HEENT: normal, [place]/AT, EOMI, head bandage, derm Neck: no JVD, normal carotid upstroke, no bruit Cardiac:  normal S1, S2; RRR; 2/6 systolic murmur LLSB mild radiation to carotids Lungs:  clear to auscultation bilaterally, no wheezing, rhonchi or rales Abd: soft, nontender, no hepatomegaly, no bruits Ext: no edema, 2+ distal pulses Skin: warm and dry GU:  deferred Neuro: no focal abnormalities noted, AAO x 3  EKG:  None today Echocardiogram: 07/06/14-ejection fraction 35% up from 25%.  ASSESSMENT AND PLAN:  1. Nonischemic cardiomyopathy - cardiac catheterization reassuring. NYHA 1-2.  Uptitrate carvedilol to 12.5 twice a day. Continue with angiotensin receptor blocker. No indication of fluid overload. Repeat echocardiogram demonstrated mild improvement in ejection fraction but still quite low. Given her advanced age, we will not refer for ICD. We did discuss the possibility of dangerous cardiac arrhythmias. 2. Hypertension-increasing carvedilol. 3. Protein malnutrition-it is likely that her albumin is slightly low causing some of the ankle edema. Her weight is decreasing. Continue to encourage adequate diet. History of small bowel obstruction. 4. 2 month followup with Cecille Rubin.  Signed, Candee Furbish, MD [facility]  07/15/2014 10:13 AM

## 2014-09-15 ENCOUNTER — Encounter: Payer: Self-pay | Admitting: Nurse Practitioner

## 2014-09-15 ENCOUNTER — Ambulatory Visit (INDEPENDENT_AMBULATORY_CARE_PROVIDER_SITE_OTHER): Payer: Medicare PPO | Admitting: Nurse Practitioner

## 2014-09-15 VITALS — BP 150/64 | HR 67 | Ht 60.0 in | Wt 104.1 lb

## 2014-09-15 DIAGNOSIS — I428 Other cardiomyopathies: Secondary | ICD-10-CM | POA: Insufficient documentation

## 2014-09-15 DIAGNOSIS — I10 Essential (primary) hypertension: Secondary | ICD-10-CM

## 2014-09-15 DIAGNOSIS — I420 Dilated cardiomyopathy: Secondary | ICD-10-CM

## 2014-09-15 NOTE — Patient Instructions (Addendum)
Stay on your current medicines  See Dr. Marlou Porch in 4 months  Keep restricting your salt, monitor your weight and call us for any swelling that does not go down overnight.  Call the [place] office at [phone] if you have any questions, problems or concerns.

## 2014-09-15 NOTE — Progress Notes (Signed)
[name] Date of Birth: 1929/07/14 Medical Record #[id number]  History of Present Illness: Ms. Boody is seen back today for a 2 month check. Seen for Dr. Marlou Porch. She is an 80 year old former [employer]. She has a history of HTN, right breast cancer, blepharitis, HLD, OA, osteoporosis and total abdominal hysterectomy back in 1994.   Presented earlier this year n March with a SBO - no improvement with conservative management - had exp lap with lysis of adhesions. On POD 3 she had acute hypoxia, tachycardia and was short of breath. Cardiology called. Transferred to [facility] due to concern for STEMI - was cathed - no CAD but has EF of 20% - felt to have Takotsubo but EF has not really recovered. She remains on Coreg and ARB.   Seen by Dr. Marlou Porch 2 months ago - she was doing ok. Taking care of her husband. Felt to be stable.   Comes in today. Here alone. She is doing ok. No chest pain. Not short of breath. Transient swelling but it typically goes away over night. She is on her feet more with caring for her husband. Weight stable. Restricting her salt. Not dizzy or lightheaded. Saw Dr. Dagmar Hait earlier this summer with labs.  Current Outpatient Prescriptions  Medication Sig Dispense Refill  . aspirin 81 MG tablet Take 81 mg by mouth daily.      . BD PEN NEEDLE NANO U/F 32G X 4 MM MISC       . Calcium Carbonate-Vitamin D (CALCIUM + D PO) Take 600 mg by mouth 3 (three) times daily.      . carvedilol (COREG) 12.5 MG tablet Take 1 tablet (12.5 mg total) by mouth 2 (two) times daily with a meal.  60 tablet  6  . clobetasol cream (TEMOVATE) 5.00 % Apply 1 application topically as needed (For itching on back).       . doxycycline (VIBRA-TABS) 100 MG tablet as directed.       Marland Kitchen FORTEO 600 MCG/2.4ML SOLN Inject 20 mcg into the skin daily.       . Polyvinyl Alcohol-Povidone (CLEAR EYES NATURAL TEARS) 5-6 MG/ML SOLN Place 2 drops into both eyes daily as needed.      . potassium chloride (K-DUR) 10  MEQ tablet       . Propylene Glycol (SYSTANE BALANCE) 0.6 % SOLN Place 1 drop into both eyes daily as needed.      . simvastatin (ZOCOR) 20 MG tablet Take 20 mg by mouth daily.      Marland Kitchen telmisartan (MICARDIS) 80 MG tablet Take 1 tablet (80 mg total) by mouth daily.  30 tablet  6   No current facility-administered medications for this visit.    No Known Allergies  Past Medical History  Diagnosis Date  . Breast cancer 1992    right breast   . Hypertension   . Blepharitis   . Osteoporosis   . Ectopic pregnancy   . Colon polyp     Past Surgical History  Procedure Laterality Date  . Ectopic pregnancy surgery      salpingectomy  . Colonoscopy w/ polypectomy    . Hand surgery  2007    right hand-ruptured tendon  . Cataract extraction  2010, 2011    2010 right eye, 2011 left eye  . Cyst removed  2012    from right wrist  . Total abdominal hysterectomy w/ bilateral salpingoophorectomy  1994  . Knee arthroscopy  1998  . Laparotomy  N/A 03/22/2014    Procedure: EXPLORATORY LAPAROTOMY;  Surgeon: Merrie Roof, MD;  Location: [facility];  Service: General;  Laterality: N/A;    History  Smoking status  . Former Smoker  Smokeless tobacco  . Never Used    History  Alcohol Use  . 3.0 oz/week  . 5 Glasses of wine per week    Comment: wine daily    Family History  Problem Relation Age of Onset  . Osteoporosis Mother   . CVA Father   . Hypertension Father   . Arthritis Mother   . Hypertension Mother     Review of Systems: The review of systems is per the HPI.  All other systems were reviewed and are negative.  Physical Exam: BP 150/64  Pulse 67  Ht 5' (1.524 m)  Wt 104 lb 1.9 oz (47.229 kg)  BMI 20.33 kg/m2  SpO2 97%  LMP 12/31/1992 Her BP by me is down to 130/80.  Patient is very pleasant and in no acute distress. Weight is stable. Skin is warm and dry. Color is normal.  HEENT is unremarkable. Normocephalic/atraumatic. PERRL. Sclera are nonicteric. Neck is supple. No  masses. No JVD. Lungs are clear. Cardiac exam shows a regular rate and rhythm. Abdomen is soft. Extremities are without edema. Gait and ROM are intact. No gross neurologic deficits noted.  Wt Readings from Last 3 Encounters:  09/15/14 104 lb 1.9 oz (47.229 kg)  07/15/14 103 lb (46.72 kg)  06/15/14 104 lb (47.174 kg)    LABORATORY DATA/PROCEDURES:  Lab Results  Component Value Date   WBC 7.1 04/06/2014   HGB 12.8 04/06/2014   HCT 37.5 04/06/2014   PLT 282.0 04/06/2014   GLUCOSE 99 04/06/2014   ALT 22 03/25/2014   AST 29 03/25/2014   NA 136 04/06/2014   K 4.3 04/06/2014   CL 98 04/06/2014   CREATININE 0.6 04/06/2014   BUN 15 04/06/2014   CO2 31 04/06/2014   INR 1.12 03/20/2014    BNP (last 3 results)  Recent Labs  03/25/14 0815 04/06/14 1458  PROBNP 10991.0* 80.0   Echo Study Conclusions from July 2015  - Left ventricle: The cavity size was normal. Systolic function was moderately to severely reduced. The estimated ejection fraction was 35%. Wall motion was normal; there were no regional wall motion abnormalities. Doppler parameters are consistent with abnormal left ventricular relaxation (grade 1 diastolic dysfunction). Doppler parameters are consistent with elevated ventricular end-diastolic filling pressure. - Aortic valve: Trileaflet; normal thickness leaflets. Transvalvular velocity was within the normal range. There was no stenosis. There was no regurgitation. - Aortic root: The aortic root was normal in size. - Mitral valve: Structurally normal valve. There was mild regurgitation. - Left atrium: The atrium was normal in size. - Right ventricle: The cavity size was normal. Wall thickness was normal. - Pulmonary arteries: Systolic pressure was moderately increased. PA peak pressure: 54 mm Hg (S).  Impressions:  - When comparedd to the prior study from 03/25/2014, the left ventricular ejection fraction is now mildly improved with LVEF 35%, previosuly 25-30%. Elevated filling  pressures. Moderate pulmonary hypertension.  ANGIOGRAPHIC DATA: The left main coronary artery is widely patent.  The left anterior descending artery is a large vessel which wraps around the apex. There several small to medium-size diagonals. The LAD system appears into grafting normal..  The left circumflex artery is a large vessel. There is a large first obtuse marginal which is widely patent. The circumflex system is angiographic normal.  The right coronary artery is a large dominant vessel. There is a large PDA and a medium-sized posterolateral artery. The RCA system is normal.  LEFT VENTRICULOGRAM: Left ventricular angiogram was done in the 30 RAO projection and revealed severely decreased in timated ejection fraction of 20%. there is apical ballooning hypokinesis of the mid to distal anterior and inferior segments. There is normal contraction of the base. LVEDP was 34 mmHg.  IMPRESSIONS:  1. Normal left main coronary artery. 2. Normal left anterior descending artery and its branches. 3. Normal left circumflex artery and its branches. 4. Normal right coronary artery. 5. Severely decreased left ventricular systolic function. LVEDP 34 mmHg. Ejection fraction 20%. This likely represents Takatsubo cardiomyopathy.  RECOMMENDATION: Continue medical therapy for heart failure. No need for anticoagulation at this time. No need for antiplatelet therapy. She will followup With Dr. Marlou Porch.       Assessment / Plan: 1. SBO - S/p exp lap for lysis of adhesions complicated by hypoxia, tachycardia with emergent cath.   2. NSTEMI  - no real CAD - felt originally to haveTakotsubo but she has had minimal improvement per echo from July - not felt to be a candidate for ICD given her age - managed medically  - no symptoms. Looks compensated.   3. HTN - BP recheck by me is ok. I have left her on her current regimen.  4. NICM - looks compensated.   See Dr. Marlou Porch back in 4 months. She knows to continue to  restrict salt, weigh daily, etc.   Patient is agreeable to this plan and will call if any problems develop in the interim.   Burtis Junes, RN, [facility] [address] [place], [place]  [id number] [phone]

## 2014-10-14 ENCOUNTER — Encounter (HOSPITAL_COMMUNITY): Payer: Self-pay | Admitting: Emergency Medicine

## 2014-10-14 ENCOUNTER — Inpatient Hospital Stay (HOSPITAL_COMMUNITY)
Admission: EM | Admit: 2014-10-14 | Discharge: 2014-10-18 | DRG: 511 | Disposition: A | Discharge status: Hospice | Payer: Medicare PPO | Attending: Internal Medicine | Admitting: Internal Medicine

## 2014-10-14 ENCOUNTER — Other Ambulatory Visit: Payer: Self-pay | Admitting: Dermatology

## 2014-10-14 DIAGNOSIS — I2109 ST elevation (STEMI) myocardial infarction involving other coronary artery of anterior wall: Secondary | ICD-10-CM

## 2014-10-14 DIAGNOSIS — Z66 Do not resuscitate: Secondary | ICD-10-CM | POA: Diagnosis present

## 2014-10-14 DIAGNOSIS — I420 Dilated cardiomyopathy: Secondary | ICD-10-CM

## 2014-10-14 DIAGNOSIS — M810 Age-related osteoporosis without current pathological fracture: Secondary | ICD-10-CM | POA: Diagnosis present

## 2014-10-14 DIAGNOSIS — I5181 Takotsubo syndrome: Secondary | ICD-10-CM | POA: Diagnosis present

## 2014-10-14 DIAGNOSIS — S52202A Unspecified fracture of shaft of left ulna, initial encounter for closed fracture: Secondary | ICD-10-CM | POA: Diagnosis present

## 2014-10-14 DIAGNOSIS — T148XXA Other injury of unspecified body region, initial encounter: Secondary | ICD-10-CM

## 2014-10-14 DIAGNOSIS — J9601 Acute respiratory failure with hypoxia: Secondary | ICD-10-CM

## 2014-10-14 DIAGNOSIS — Z7982 Long term (current) use of aspirin: Secondary | ICD-10-CM | POA: Diagnosis not present

## 2014-10-14 DIAGNOSIS — Z853 Personal history of malignant neoplasm of breast: Secondary | ICD-10-CM

## 2014-10-14 DIAGNOSIS — W19XXXA Unspecified fall, initial encounter: Secondary | ICD-10-CM | POA: Diagnosis present

## 2014-10-14 DIAGNOSIS — S42302B Unspecified fracture of shaft of humerus, left arm, initial encounter for open fracture: Secondary | ICD-10-CM

## 2014-10-14 DIAGNOSIS — I5041 Acute combined systolic (congestive) and diastolic (congestive) heart failure: Secondary | ICD-10-CM

## 2014-10-14 DIAGNOSIS — E440 Moderate protein-calorie malnutrition: Secondary | ICD-10-CM

## 2014-10-14 DIAGNOSIS — M1710 Unilateral primary osteoarthritis, unspecified knee: Secondary | ICD-10-CM

## 2014-10-14 DIAGNOSIS — S52022A Displaced fracture of olecranon process without intraarticular extension of left ulna, initial encounter for closed fracture: Principal | ICD-10-CM | POA: Diagnosis present

## 2014-10-14 DIAGNOSIS — Z87891 Personal history of nicotine dependence: Secondary | ICD-10-CM | POA: Diagnosis not present

## 2014-10-14 DIAGNOSIS — K56609 Unspecified intestinal obstruction, unspecified as to partial versus complete obstruction: Secondary | ICD-10-CM

## 2014-10-14 DIAGNOSIS — I10 Essential (primary) hypertension: Secondary | ICD-10-CM | POA: Diagnosis present

## 2014-10-14 DIAGNOSIS — S42309B Unspecified fracture of shaft of humerus, unspecified arm, initial encounter for open fracture: Secondary | ICD-10-CM | POA: Diagnosis present

## 2014-10-14 DIAGNOSIS — I428 Other cardiomyopathies: Secondary | ICD-10-CM

## 2014-10-14 LAB — CBC WITH DIFFERENTIAL/PLATELET
BASOS PCT: 1 % (ref 0–1)
Basophils Absolute: 0.1 10*3/uL (ref 0.0–0.1)
Eosinophils Absolute: 0.1 10*3/uL (ref 0.0–0.7)
Eosinophils Relative: 1 % (ref 0–5)
HEMATOCRIT: 37.6 % (ref 36.0–46.0)
Hemoglobin: 13.3 g/dL (ref 12.0–15.0)
LYMPHS PCT: 15 % (ref 12–46)
Lymphs Abs: 1.4 10*3/uL (ref 0.7–4.0)
MCH: 34.1 pg — ABNORMAL HIGH (ref 26.0–34.0)
MCHC: 35.4 g/dL (ref 30.0–36.0)
MCV: 96.4 fL (ref 78.0–100.0)
MONO ABS: 1.1 10*3/uL — AB (ref 0.1–1.0)
Monocytes Relative: 12 % (ref 3–12)
NEUTROS ABS: 7 10*3/uL (ref 1.7–7.7)
NEUTROS PCT: 71 % (ref 43–77)
PLATELETS: 171 10*3/uL (ref 150–400)
RBC: 3.9 MIL/uL (ref 3.87–5.11)
RDW: 13 % (ref 11.5–15.5)
WBC: 9.7 10*3/uL (ref 4.0–10.5)

## 2014-10-14 LAB — BASIC METABOLIC PANEL
ANION GAP: 13 (ref 5–15)
BUN: 20 mg/dL (ref 6–23)
CALCIUM: 11.3 mg/dL — AB (ref 8.4–10.5)
CO2: 27 mEq/L (ref 19–32)
CREATININE: 0.67 mg/dL (ref 0.50–1.10)
Chloride: 99 mEq/L (ref 96–112)
GFR calc non Af Amer: 80 mL/min — ABNORMAL LOW (ref >90)
Glucose, Bld: 140 mg/dL — ABNORMAL HIGH (ref 70–99)
Potassium: 4 mEq/L (ref 3.7–5.3)
SODIUM: 139 meq/L (ref 137–147)

## 2014-10-14 LAB — PROTIME-INR
INR: 1.16 (ref 0.00–1.49)
Prothrombin Time: 14.9 seconds (ref 11.6–15.2)

## 2014-10-14 LAB — APTT: aPTT: 31 seconds (ref 24–37)

## 2014-10-14 MED ORDER — FENTANYL CITRATE 0.05 MG/ML IJ SOLN
50.0000 ug | Freq: Once | INTRAMUSCULAR | Status: AC
  Administered 2014-10-14: 50 ug via INTRAVENOUS
  Filled 2014-10-14: qty 2

## 2014-10-14 MED ORDER — CEFAZOLIN SODIUM-DEXTROSE 2-3 GM-% IV SOLR
2.0000 g | Freq: Once | INTRAVENOUS | Status: AC
  Administered 2014-10-14: 2 g via INTRAVENOUS
  Filled 2014-10-14: qty 50

## 2014-10-14 NOTE — H&P (Signed)
[facility]                                                                                    Patient Demographics  Kimberly Jordan, is a 80 y.o. female  CSN: [id number]  MRN: [id number]  DOB - Sep 20, 1929  Admit Date - 10/14/2014  Outpatient Primary MD for the patient is Tivis Ringer, MD   With History of -  Past Medical History  Diagnosis Date  . Breast cancer 1992    right breast   . Hypertension   . Blepharitis   . Osteoporosis   . Ectopic pregnancy   . Colon polyp       Past Surgical History  Procedure Laterality Date  . Ectopic pregnancy surgery      salpingectomy  . Colonoscopy w/ polypectomy    . Hand surgery  2007    right hand-ruptured tendon  . Cataract extraction  2010, 2011    2010 right eye, 2011 left eye  . Cyst removed  2012    from right wrist  . Total abdominal hysterectomy w/ bilateral salpingoophorectomy  1994  . Knee arthroscopy  1998  . Laparotomy N/A 03/22/2014    Procedure: EXPLORATORY LAPAROTOMY;  Surgeon: Merrie Roof, MD;  Location: [facility];  Service: General;  Laterality: N/A;    in for   Chief Complaint  Patient presents with  . Fall     HPI  Kimberly Jordan  is a 80 y.o. female, with past medical history significant for cardiomyopathy with ejection fraction 35% on her last echo this year, hypertension and right breast cancer presenting after a mechanical fall that occurred earlier today when she missed the last step. No history of prior chest pain or palpitations, no history of headaches, dizziness or loss of consciousness. The patient went to 2 on outside clinic where she was found to have a proximal ulnar complicated fracture on the left and was sent to the emergency room for evaluation.    Review of Systems    In addition to the HPI above,  No Fever-chills, No Headache, No changes with Vision or hearing, No problems swallowing food or Liquids, No Chest pain, Cough or Shortness of Breath, No Abdominal  pain, No Nausea or Vommitting, Bowel movements are regular, No Blood in stool or Urine, No dysuria, No new skin rashes or bruises,  No recent weight gain or loss, No polyuria, polydypsia or polyphagia, No significant Mental Stressors.  A full 10 point Review of Systems was done, except as stated above, all other Review of Systems were negative.   Social History History  Substance Use Topics  . Smoking status: Former Research scientist (life sciences)  . Smokeless tobacco: Never Used  . Alcohol Use: 3.0 oz/week    5 Glasses of wine per week     Comment: wine daily     Family History Family History  Problem Relation Age of Onset  . Osteoporosis Mother   . CVA Father   . Hypertension Father   . Arthritis Mother   . Hypertension Mother      Prior to Admission medications   Medication Sig Start Date End Date Taking? Authorizing  Provider  aspirin 81 MG tablet Take 81 mg by mouth daily.    Historical Provider, MD  BD PEN NEEDLE NANO U/F 32G X 4 MM MISC  04/13/14   Historical Provider, MD  Calcium Carbonate-Vitamin D (CALCIUM + D PO) Take 600 mg by mouth 3 (three) times daily.    Historical Provider, MD  carvedilol (COREG) 12.5 MG tablet Take 1 tablet (12.5 mg total) by mouth 2 (two) times daily with a meal. 07/15/14   Candee Furbish, MD  clobetasol cream (TEMOVATE) 9.56 % Apply 1 application topically as needed (For itching on back).  01/11/14   Historical Provider, MD  doxycycline (VIBRA-TABS) 100 MG tablet as directed.  04/13/14   Historical Provider, MD  FORTEO 600 MCG/2.4ML SOLN Inject 20 mcg into the skin daily.  01/08/14   Historical Provider, MD  Polyvinyl Alcohol-Povidone (CLEAR EYES NATURAL TEARS) 5-6 MG/ML SOLN Place 2 drops into both eyes daily as needed.    Historical Provider, MD  potassium chloride (K-DUR) 10 MEQ tablet  04/20/14   Historical Provider, MD  Propylene Glycol (SYSTANE BALANCE) 0.6 % SOLN Place 1 drop into both eyes daily as needed.    Historical Provider, MD  simvastatin (ZOCOR) 20 MG  tablet Take 20 mg by mouth daily.    Historical Provider, MD  telmisartan (MICARDIS) 80 MG tablet Take 1 tablet (80 mg total) by mouth daily. 04/07/14   Burtis Junes, NP    No Known Allergies  Physical Exam  Vitals  Blood pressure 146/78, pulse 80, temperature 98.1 F (36.7 C), temperature source Oral, resp. rate 21, last menstrual period 12/31/1992, SpO2 100.00%.   1. General elderly white American female, well-nourished and developed. Looks comfortable now. Very pleasant  2. Normal affect and insight, Not Suicidal or Homicidal, Awake Alert, Oriented X 3.  3. No F.N deficits grossly, ALL C.Nerves Intact,  .  4. Ears and Eyes appear Normal, Conjunctivae clear, PERRLA. Moist Oral Mucosa.  5. Supple Neck, No JVD, No cervical lymphadenopathy appriciated, No Carotid Bruits.  6. Symmetrical Chest wall movement, Good air movement bilaterally, CTAB.  7. irregular, No Gallops, Rubs or Murmurs, No Parasternal Heave.  8. Positive Bowel Sounds, Abdomen Soft, Non tender, No organomegaly appriciated,No rebound -guarding or rigidity.  9.  No Cyanosis, Normal Skin Turgor, No Skin Rash or Bruise.  10. Extremities. Left arm is bandaged and a splint is present  11. No Palpable Lymph Nodes in Neck or Axillae    Data Review  CBC  Recent Labs Lab 10/14/14 2154  WBC 9.7  HGB 13.3  HCT 37.6  PLT 171  MCV 96.4  MCH 34.1*  MCHC 35.4  RDW 13.0  LYMPHSABS 1.4  MONOABS 1.1*  EOSABS 0.1  BASOSABS 0.1   ------------------------------------------------------------------------------------------------------------------  Chemistries  No results found for this basename: NA, K, CL, CO2, GLUCOSE, BUN, CREATININE, GFRCGP, CALCIUM, MG, AST, ALT, ALKPHOS, BILITOT,  in the last 168 hours ------------------------------------------------------------------------------------------------------------------ CrCl is unknown because both a height and weight (above a minimum accepted value) are  required for this calculation. ------------------------------------------------------------------------------------------------------------------ No results found for this basename: TSH, T4TOTAL, FREET3, T3FREE, THYROIDAB,  in the last 72 hours   Coagulation profile  Recent Labs Lab 10/14/14 2154  INR 1.16   ------------------------------------------------------------------------------------------------------------------- No results found for this basename: DDIMER,  in the last 72 hours -------------------------------------------------------------------------------------------------------------------  Cardiac Enzymes No results found for this basename: CK, CKMB, TROPONINI, MYOGLOBIN,  in the last 168 hours ------------------------------------------------------------------------------------------------------------------ No components found with this basename: POCBNP,    ---------------------------------------------------------------------------------------------------------------  ----------------------------------------------------------------------------------------------------------------  Imaging results:   No results found.  My personal review of EKG: Normal sinus rhythm at 73 beats per minutes with left bundle branch block and PVCs    Assessment & Plan  1. complicated left proximal ulnar fracture     For surgery in a.m.     IV antibiotics per surgery     Pain control  2. History of cardiomyopathy with ejection fraction of 35% earlier this year by echo, improved from 25-30%     Continue Coreg and Micardis  3. History of hypertension     Stable   DVT Prophylaxis Lovenox  AM Labs Ordered, also please review Full Orders  Family Communication: Admission, patients condition and plan of care including tests being ordered have been discussed with the patient and daughter who indicate understanding and agree with the plan and Code Status.  Code Status DO NOT  RESUSCITATE  Disposition Plan: Home  Time spent in minutes : 31 minutes  Condition GUARDED   @SIGNATURE @

## 2014-10-14 NOTE — ED Notes (Signed)
Pt presents upon referral from an orthopedic office with a diagnosed ulnar fracture.  Pt states she fell from stairs/missed astep and fell on her entire left side of her body.  The pt states that she went to her PCP and was quickly referred to an orthopedist across the street from PCP.  Xrays and other test were run by Dr. Fredonia Highland to determine that patient had an open left ulnar fracture.  Dr. Radene Gunning will admit patient.

## 2014-10-14 NOTE — ED Provider Notes (Signed)
CSN: [id number]     Arrival date & time 10/14/14  2131 History   First MD Initiated Contact with Patient 10/14/14 2133     Chief Complaint  Patient presents with  . Fall     (Consider location/radiation/quality/duration/timing/severity/associated sxs/prior Treatment) HPI Comments:  Patient is 80 year old female who comes in with chief complaint of  Already diagnosed left proximal ulnar fracture.  Patient fell earlier today mechanically and landed on left side. She went to an urgent care  run by an orthopedist,  Dr. Percell Miller, who got x-rays showing a left ulnar fracture. She denies numbness, or weakness. She has a small laceration of the left elbow. This is concerning for open fracture. She denies any other complaints. Specifically, no chest pain, shortness of breath, abdominal pain, nausea vomiting diarrhea, numbness, weakness.  Last tetanus 8 years ago.  The history is provided by the patient.    Past Medical History  Diagnosis Date  . Breast cancer 1992    right breast   . Hypertension   . Blepharitis   . Osteoporosis   . Ectopic pregnancy   . Colon polyp    Past Surgical History  Procedure Laterality Date  . Ectopic pregnancy surgery      salpingectomy  . Colonoscopy w/ polypectomy    . Hand surgery  2007    right hand-ruptured tendon  . Cataract extraction  2010, 2011    2010 right eye, 2011 left eye  . Cyst removed  2012    from right wrist  . Total abdominal hysterectomy w/ bilateral salpingoophorectomy  1994  . Knee arthroscopy  1998  . Laparotomy N/A 03/22/2014    Procedure: EXPLORATORY LAPAROTOMY;  Surgeon: Merrie Roof, MD;  Location: [facility];  Service: General;  Laterality: N/A;   Family History  Problem Relation Age of Onset  . Osteoporosis Mother   . CVA Father   . Hypertension Father   . Arthritis Mother   . Hypertension Mother    History  Substance Use Topics  . Smoking status: Former Research scientist (life sciences)  . Smokeless tobacco: Never Used  . Alcohol Use: 3.0  oz/week    5 Glasses of wine per week     Comment: wine daily   OB History   Grav Para Term Preterm Abortions TAB SAB Ect Mult Living   4 1   3  2 1  1      Review of Systems  Constitutional: Negative for fever, activity change, appetite change and fatigue.  HENT: Negative for congestion and rhinorrhea.   Eyes: Negative for discharge, redness and itching.  Respiratory: Negative for shortness of breath and wheezing.   Cardiovascular: Negative for chest pain.  Gastrointestinal: Negative for nausea, vomiting, abdominal pain and diarrhea.  Genitourinary: Negative for dysuria and hematuria.  Musculoskeletal: Negative for back pain.  Skin: Positive for wound. Negative for rash.  Neurological: Negative for syncope, weakness and numbness.      Allergies  Review of patient's allergies indicates no known allergies.  Home Medications   Prior to Admission medications   Medication Sig Start Date End Date Taking? Authorizing Provider  aspirin 81 MG tablet Take 81 mg by mouth daily.   Yes Historical Provider, MD  Calcium Carbonate-Vitamin D (CALCIUM + D PO) Take 600 mg by mouth 3 (three) times daily.   Yes Historical Provider, MD  carvedilol (COREG) 12.5 MG tablet Take 1 tablet (12.5 mg total) by mouth 2 (two) times daily with a meal. 07/15/14  Yes Candee Furbish,  MD  clobetasol cream (TEMOVATE) 8.14 % Apply 1 application topically as needed (For itching on back).  01/11/14  Yes Historical Provider, MD  doxycycline (VIBRA-TABS) 100 MG tablet Take 100 mg by mouth every evening.  04/13/14  Yes Historical Provider, MD  FORTEO 600 MCG/2.4ML SOLN Inject 20 mcg into the skin daily.  01/08/14  Yes Historical Provider, MD  OVER THE COUNTER MEDICATION Place 1 application into both eyes at bedtime. occu scrub   Yes Historical Provider, MD  Polyvinyl Alcohol-Povidone (CLEAR EYES NATURAL TEARS) 5-6 MG/ML SOLN Place 2 drops into both eyes daily as needed (dry eyes).    Yes Historical Provider, MD  potassium  chloride (K-DUR) 10 MEQ tablet Take 10 mEq by mouth 2 (two) times daily.  04/20/14  Yes Historical Provider, MD  Propylene Glycol (SYSTANE BALANCE) 0.6 % SOLN Place 1 drop into both eyes daily as needed (dry eyes).    Yes Historical Provider, MD  simvastatin (ZOCOR) 20 MG tablet Take 20 mg by mouth every evening.    Yes Historical Provider, MD  telmisartan (MICARDIS) 80 MG tablet Take 1 tablet (80 mg total) by mouth daily. 04/07/14  Yes Burtis Junes, NP   BP 146/78  Pulse 80  Temp(Src) 98.1 F (36.7 C) (Oral)  Resp 21  SpO2 100%  LMP 12/31/1992 Physical Exam  Constitutional: She is oriented to person, place, and time. She appears well-developed and well-nourished. No distress.  HENT:  Head: Normocephalic and atraumatic.  Mouth/Throat: Oropharynx is clear and moist. No oropharyngeal exudate.  No signs of head trauma  Eyes: Conjunctivae and EOM are normal. Pupils are equal, round, and reactive to light. Right eye exhibits no discharge. Left eye exhibits no discharge. No scleral icterus.  Neck: Normal range of motion. Neck supple.  Cardiovascular: Normal rate, regular rhythm and normal heart sounds.   No murmur heard. Pulmonary/Chest: Effort normal and breath sounds normal. No respiratory distress. She has no wheezes. She has no rales. She exhibits no tenderness.  Abdominal: Soft. She exhibits no distension and no mass. There is no tenderness.  Musculoskeletal:  Left arm with large amount of ecchymosis and small laceration on the lateral proximal aspect about 3 cm.  Diffusely tender. Neurovascular intact distal.  no tenderness palpation of the wrist or shoulder.  Neurological: She is alert and oriented to person, place, and time. She exhibits normal muscle tone. Coordination normal.  Skin: Skin is warm. No rash noted. She is not diaphoretic.    ED Course  Procedures (including critical care time) Labs Review Labs Reviewed  BASIC METABOLIC PANEL - Abnormal; Notable for the following:     Glucose, Bld 140 (*)    Calcium 11.3 (*)    GFR calc non Af Amer 80 (*)    All other components within normal limits  CBC WITH DIFFERENTIAL - Abnormal; Notable for the following:    MCH 34.1 (*)    Monocytes Absolute 1.1 (*)    All other components within normal limits  APTT  PROTIME-INR  TYPE AND SCREEN    Imaging Review No results found.   EKG Interpretation   Date/Time:  Thursday October 14 2014 21:45:14 EDT Ventricular Rate:  73 PR Interval:  125 QRS Duration: 142 QT Interval:  383 QTC Calculation: 422 R Axis:   61 Text Interpretation:  Sinus rhythm Paired ventricular premature complexes  IVCD, consider atypical LBBB Baseline wander in lead(s) III aVF Abnormal  ekg Confirmed by Christy Gentles  MD, [place] ([id number]) on 10/14/2014 10:08:21 PM  MDM   MDM: 80 year old female with mechanical fall now with resultant left ulnar open fracture.  Patient saw Dr. Fredonia Highland with orthopedics prior to arrival who has films.  Tetanus up-to-date.  No other signs of trauma.  Will cover with Ancef for open fracture. Wound copiously irrigated with normal saline and bandaged and splinted. Will admit to hospitalist as patient has multiple medical comorbidities.  Final diagnoses:  Open arm fracture, left, initial encounter  Congestive dilated cardiomyopathy  Primary localized osteoarthrosis, lower leg, unspecified laterality    Admit   Sol Passer, MD 10/15/14 [phone]

## 2014-10-14 NOTE — ED Notes (Signed)
Pt last ate at 9:00 pm

## 2014-10-14 NOTE — Consult Note (Signed)
ORTHOPAEDIC CONSULTATION  REQUESTING PHYSICIAN: Sharyon Cable, MD  Chief Complaint: Left elbow fracture  HPI: Kimberly Jordan is a 80 y.o. female who complains of a mechanical fall onto her left elbow, on 10/15 at lunch  Past Medical History  Diagnosis Date  . Breast cancer 1992    right breast   . Hypertension   . Blepharitis   . Osteoporosis   . Ectopic pregnancy   . Colon polyp    Past Surgical History  Procedure Laterality Date  . Ectopic pregnancy surgery      salpingectomy  . Colonoscopy w/ polypectomy    . Hand surgery  2007    right hand-ruptured tendon  . Cataract extraction  2010, 2011    2010 right eye, 2011 left eye  . Cyst removed  2012    from right wrist  . Total abdominal hysterectomy w/ bilateral salpingoophorectomy  1994  . Knee arthroscopy  1998  . Laparotomy N/A 03/22/2014    Procedure: EXPLORATORY LAPAROTOMY;  Surgeon: Merrie Roof, MD;  Location: [facility];  Service: General;  Laterality: N/A;   History   Social History  . Marital Status: Married    Spouse Name: N/A    Number of Children: N/A  . Years of Education: N/A   Social History Main Topics  . Smoking status: Former Research scientist (life sciences)  . Smokeless tobacco: Never Used  . Alcohol Use: 3.0 oz/week    5 Glasses of wine per week     Comment: wine daily  . Drug Use: No  . Sexual Activity: No     Comment: TAH/BSO   Other Topics Concern  . Not on file   Social History Narrative  . No narrative on file   Family History  Problem Relation Age of Onset  . Osteoporosis Mother   . CVA Father   . Hypertension Father   . Arthritis Mother   . Hypertension Mother    No Known Allergies Prior to Admission medications   Medication Sig Start Date End Date Taking? Authorizing Provider  aspirin 81 MG tablet Take 81 mg by mouth daily.    Historical Provider, MD  BD PEN NEEDLE NANO U/F 32G X 4 MM MISC  04/13/14   Historical Provider, MD  Calcium Carbonate-Vitamin D (CALCIUM + D PO) Take 600 mg  by mouth 3 (three) times daily.    Historical Provider, MD  carvedilol (COREG) 12.5 MG tablet Take 1 tablet (12.5 mg total) by mouth 2 (two) times daily with a meal. 07/15/14   Candee Furbish, MD  clobetasol cream (TEMOVATE) 0.48 % Apply 1 application topically as needed (For itching on back).  01/11/14   Historical Provider, MD  doxycycline (VIBRA-TABS) 100 MG tablet as directed.  04/13/14   Historical Provider, MD  FORTEO 600 MCG/2.4ML SOLN Inject 20 mcg into the skin daily.  01/08/14   Historical Provider, MD  Polyvinyl Alcohol-Povidone (CLEAR EYES NATURAL TEARS) 5-6 MG/ML SOLN Place 2 drops into both eyes daily as needed.    Historical Provider, MD  potassium chloride (K-DUR) 10 MEQ tablet  04/20/14   Historical Provider, MD  Propylene Glycol (SYSTANE BALANCE) 0.6 % SOLN Place 1 drop into both eyes daily as needed.    Historical Provider, MD  simvastatin (ZOCOR) 20 MG tablet Take 20 mg by mouth daily.    Historical Provider, MD  telmisartan (MICARDIS) 80 MG tablet Take 1 tablet (80 mg total) by mouth daily. 04/07/14   Burtis Junes,  NP   No results found.  Positive ROS: All other systems have been reviewed and were otherwise negative with the exception of those mentioned in the HPI and as above.  Labs cbc No results found for this basename: WBC, HGB, HCT, PLT,  in the last 72 hours  Labs inflam No results found for this basename: ESR, CRP,  in the last 72 hours  Labs coag No results found for this basename: INR, PT, PTT,  in the last 72 hours  No results found for this basename: NA, K, CL, CO2, GLUCOSE, BUN, CREATININE, CALCIUM,  in the last 72 hours  Physical Exam: Filed Vitals:   10/14/14 2147  BP: 159/85  Pulse: 79  Temp: 98.1 F (36.7 C)  Resp: 12   General: Alert, no acute distress Cardiovascular: No pedal edema Respiratory: No cyanosis, no use of accessory musculature GI: No organomegaly, abdomen is soft and non-tender Skin: No lesions in the area of chief complaint other than  those listed below in MSK exam.  Neurologic: Sensation intact distally Psychiatric: Patient is competent for consent with normal mood and affect Lymphatic: No axillary or cervical lymphadenopathy  MUSCULOSKELETAL:  LUE: splint intact, SILT M/R/U nerve, 2+ radial pulse, +EPL/FPL/IO Compartments soft  Other extremities are atraumatic with painless ROM and NVI.  Assessment: Left open olecranon fracture.   Plan: I&D and ORIF today of her open olecranon fx Weight Bearing Status: NWB PT VTE px: SCD's and ASA post op   Edmonia Lynch, D, MD Cell [phone]   10/14/2014 10:00 PM

## 2014-10-15 ENCOUNTER — Inpatient Hospital Stay (HOSPITAL_COMMUNITY): Payer: Medicare PPO

## 2014-10-15 ENCOUNTER — Encounter (HOSPITAL_COMMUNITY)
Admission: EM | Disposition: A | Discharge status: Hospice | Payer: Self-pay | Source: Home / Self Care | Attending: Internal Medicine

## 2014-10-15 ENCOUNTER — Inpatient Hospital Stay (HOSPITAL_COMMUNITY): Payer: Medicare PPO | Admitting: Certified Registered Nurse Anesthetist

## 2014-10-15 ENCOUNTER — Encounter (HOSPITAL_COMMUNITY): Payer: Self-pay | Admitting: Certified Registered Nurse Anesthetist

## 2014-10-15 ENCOUNTER — Encounter (HOSPITAL_COMMUNITY): Payer: Medicare PPO | Admitting: Certified Registered Nurse Anesthetist

## 2014-10-15 DIAGNOSIS — I10 Essential (primary) hypertension: Secondary | ICD-10-CM

## 2014-10-15 HISTORY — PX: ORIF ELBOW FRACTURE: SHX5031

## 2014-10-15 LAB — CBC
HCT: 34 % — ABNORMAL LOW (ref 36.0–46.0)
Hemoglobin: 11.8 g/dL — ABNORMAL LOW (ref 12.0–15.0)
MCH: 33.4 pg (ref 26.0–34.0)
MCHC: 34.7 g/dL (ref 30.0–36.0)
MCV: 96.3 fL (ref 78.0–100.0)
Platelets: 144 10*3/uL — ABNORMAL LOW (ref 150–400)
RBC: 3.53 MIL/uL — ABNORMAL LOW (ref 3.87–5.11)
RDW: 13 % (ref 11.5–15.5)
WBC: 7.4 10*3/uL (ref 4.0–10.5)

## 2014-10-15 LAB — BASIC METABOLIC PANEL
Anion gap: 12 (ref 5–15)
BUN: 17 mg/dL (ref 6–23)
CO2: 28 mEq/L (ref 19–32)
CREATININE: 0.64 mg/dL (ref 0.50–1.10)
Calcium: 9.9 mg/dL (ref 8.4–10.5)
Chloride: 100 mEq/L (ref 96–112)
GFR calc non Af Amer: 81 mL/min — ABNORMAL LOW (ref >90)
Glucose, Bld: 123 mg/dL — ABNORMAL HIGH (ref 70–99)
Potassium: 3.4 mEq/L — ABNORMAL LOW (ref 3.7–5.3)
Sodium: 140 mEq/L (ref 137–147)

## 2014-10-15 LAB — TYPE AND SCREEN
ABO/RH(D): O NEG
ANTIBODY SCREEN: POSITIVE
DAT, IgG: NEGATIVE

## 2014-10-15 SURGERY — OPEN REDUCTION INTERNAL FIXATION (ORIF) ELBOW/OLECRANON FRACTURE
Anesthesia: General | Site: Elbow | Laterality: Left

## 2014-10-15 MED ORDER — POTASSIUM CHLORIDE IN NACL 20-0.9 MEQ/L-% IV SOLN
INTRAVENOUS | Status: DC
  Filled 2014-10-15 (×2): qty 1000

## 2014-10-15 MED ORDER — HYDRALAZINE HCL 20 MG/ML IJ SOLN
5.0000 mg | Freq: Four times a day (QID) | INTRAMUSCULAR | Status: DC | PRN
  Administered 2014-10-16: 5 mg via INTRAVENOUS
  Filled 2014-10-15: qty 1

## 2014-10-15 MED ORDER — FENTANYL CITRATE 0.05 MG/ML IJ SOLN
50.0000 ug | INTRAMUSCULAR | Status: DC | PRN
  Administered 2014-10-15 – 2014-10-16 (×3): 50 ug via INTRAVENOUS
  Filled 2014-10-15 (×4): qty 2

## 2014-10-15 MED ORDER — MIDAZOLAM HCL 2 MG/2ML IJ SOLN
INTRAMUSCULAR | Status: AC
  Filled 2014-10-15: qty 2

## 2014-10-15 MED ORDER — HYDROCODONE-ACETAMINOPHEN 5-325 MG PO TABS: 1.0000 | ORAL_TABLET | ORAL | Status: DC | PRN

## 2014-10-15 MED ORDER — CARVEDILOL 12.5 MG PO TABS
12.5000 mg | ORAL_TABLET | Freq: Two times a day (BID) | ORAL | Status: DC
  Administered 2014-10-15 – 2014-10-18 (×6): 12.5 mg via ORAL
  Filled 2014-10-15 (×9): qty 1

## 2014-10-15 MED ORDER — SODIUM CHLORIDE 0.9 % IV SOLN: 250.0000 mL | INTRAVENOUS | Status: DC | PRN

## 2014-10-15 MED ORDER — PROPOFOL 10 MG/ML IV BOLUS
INTRAVENOUS | Status: DC | PRN
  Administered 2014-10-15: 120 mg via INTRAVENOUS

## 2014-10-15 MED ORDER — CLOBETASOL PROPIONATE 0.05 % EX CREA: 1.0000 "application " | TOPICAL_CREAM | Freq: Three times a day (TID) | CUTANEOUS | Status: DC | PRN

## 2014-10-15 MED ORDER — SODIUM CHLORIDE 0.9 % IJ SOLN: 3.0000 mL | INTRAMUSCULAR | Status: DC | PRN

## 2014-10-15 MED ORDER — ENOXAPARIN SODIUM 30 MG/0.3ML ~~LOC~~ SOLN
30.0000 mg | Freq: Every day | SUBCUTANEOUS | Status: DC
  Administered 2014-10-16 – 2014-10-18 (×3): 30 mg via SUBCUTANEOUS
  Filled 2014-10-15 (×4): qty 0.3

## 2014-10-15 MED ORDER — TERIPARATIDE (RECOMBINANT) 600 MCG/2.4ML ~~LOC~~ SOLN
20.0000 ug | Freq: Every day | SUBCUTANEOUS | Status: DC
  Administered 2014-10-15 – 2014-10-18 (×4): 20 ug via SUBCUTANEOUS

## 2014-10-15 MED ORDER — LACTATED RINGERS IV SOLN
INTRAVENOUS | Status: DC
  Administered 2014-10-15: 16:00:00 via INTRAVENOUS

## 2014-10-15 MED ORDER — CEFAZOLIN SODIUM-DEXTROSE 2-3 GM-% IV SOLR
2.0000 g | Freq: Four times a day (QID) | INTRAVENOUS | Status: AC
  Administered 2014-10-16 (×3): 2 g via INTRAVENOUS
  Filled 2014-10-15 (×3): qty 50

## 2014-10-15 MED ORDER — ONDANSETRON HCL 4 MG/2ML IJ SOLN
4.0000 mg | Freq: Four times a day (QID) | INTRAMUSCULAR | Status: DC | PRN
  Administered 2014-10-15 – 2014-10-16 (×2): 4 mg via INTRAVENOUS
  Filled 2014-10-15 (×2): qty 2

## 2014-10-15 MED ORDER — EPHEDRINE SULFATE 50 MG/ML IJ SOLN
INTRAMUSCULAR | Status: DC | PRN
  Administered 2014-10-15 (×3): 5 mg via INTRAVENOUS
  Administered 2014-10-15: 10 mg via INTRAVENOUS

## 2014-10-15 MED ORDER — ACETAMINOPHEN 325 MG PO TABS: 650.0000 mg | ORAL_TABLET | Freq: Four times a day (QID) | ORAL | Status: DC | PRN

## 2014-10-15 MED ORDER — ONDANSETRON HCL 4 MG/2ML IJ SOLN
INTRAMUSCULAR | Status: DC | PRN
Start: 2014-10-15 — End: 2014-10-15
  Administered 2014-10-15: 4 mg via INTRAVENOUS

## 2014-10-15 MED ORDER — ACETAMINOPHEN 650 MG RE SUPP: 650.0000 mg | Freq: Four times a day (QID) | RECTAL | Status: DC | PRN

## 2014-10-15 MED ORDER — PROPOFOL 10 MG/ML IV BOLUS
INTRAVENOUS | Status: AC
  Filled 2014-10-15: qty 20

## 2014-10-15 MED ORDER — LIDOCAINE HCL (CARDIAC) 20 MG/ML IV SOLN
INTRAVENOUS | Status: AC
  Filled 2014-10-15: qty 5

## 2014-10-15 MED ORDER — DOCUSATE SODIUM 100 MG PO CAPS: 100.0000 mg | ORAL_CAPSULE | Freq: Two times a day (BID) | ORAL | Status: DC

## 2014-10-15 MED ORDER — ROCURONIUM BROMIDE 50 MG/5ML IV SOLN
INTRAVENOUS | Status: AC
  Filled 2014-10-15: qty 1

## 2014-10-15 MED ORDER — FENTANYL CITRATE 0.05 MG/ML IJ SOLN
INTRAMUSCULAR | Status: AC
  Filled 2014-10-15: qty 5

## 2014-10-15 MED ORDER — FENTANYL CITRATE 0.05 MG/ML IJ SOLN
INTRAMUSCULAR | Status: AC
  Filled 2014-10-15: qty 2

## 2014-10-15 MED ORDER — ONDANSETRON HCL 4 MG/2ML IJ SOLN
INTRAMUSCULAR | Status: AC
  Filled 2014-10-15: qty 2

## 2014-10-15 MED ORDER — IRBESARTAN 75 MG PO TABS
75.0000 mg | ORAL_TABLET | Freq: Every day | ORAL | Status: DC
  Administered 2014-10-15: 75 mg via ORAL
  Filled 2014-10-15: qty 1

## 2014-10-15 MED ORDER — ROPIVACAINE HCL 5 MG/ML IJ SOLN
INTRAMUSCULAR | Status: DC | PRN
  Administered 2014-10-15: 100 mg via PERINEURAL

## 2014-10-15 MED ORDER — CEFAZOLIN SODIUM-DEXTROSE 2-3 GM-% IV SOLR
INTRAVENOUS | Status: DC | PRN
  Administered 2014-10-15: 2 g via INTRAVENOUS

## 2014-10-15 MED ORDER — ONDANSETRON HCL 4 MG PO TABS: 4.0000 mg | ORAL_TABLET | Freq: Four times a day (QID) | ORAL | Status: DC | PRN

## 2014-10-15 MED ORDER — FENTANYL CITRATE 0.05 MG/ML IJ SOLN
25.0000 ug | INTRAMUSCULAR | Status: DC | PRN
  Administered 2014-10-15: 50 ug via INTRAVENOUS

## 2014-10-15 MED ORDER — 0.9 % SODIUM CHLORIDE (POUR BTL) OPTIME
TOPICAL | Status: DC | PRN
  Administered 2014-10-15: 1000 mL

## 2014-10-15 MED ORDER — SODIUM CHLORIDE 0.9 % IJ SOLN
3.0000 mL | Freq: Two times a day (BID) | INTRAMUSCULAR | Status: DC
  Administered 2014-10-15 – 2014-10-18 (×6): 3 mL via INTRAVENOUS

## 2014-10-15 MED ORDER — ZOLPIDEM TARTRATE 5 MG PO TABS
5.0000 mg | ORAL_TABLET | Freq: Every evening | ORAL | Status: DC | PRN
  Administered 2014-10-16 – 2014-10-17 (×2): 5 mg via ORAL
  Filled 2014-10-15 (×2): qty 1

## 2014-10-15 MED ORDER — SIMVASTATIN 20 MG PO TABS
20.0000 mg | ORAL_TABLET | Freq: Every day | ORAL | Status: DC
  Administered 2014-10-15 – 2014-10-18 (×4): 20 mg via ORAL
  Filled 2014-10-15 (×4): qty 1

## 2014-10-15 MED ORDER — POLYVINYL ALCOHOL 1.4 % OP SOLN: 2.0000 [drp] | Freq: Every day | OPHTHALMIC | Status: DC | PRN

## 2014-10-15 MED ORDER — HYDROCODONE-ACETAMINOPHEN 5-325 MG PO TABS
1.0000 | ORAL_TABLET | ORAL | Status: DC | PRN
  Administered 2014-10-15 – 2014-10-16 (×2): 1 via ORAL
  Administered 2014-10-16 – 2014-10-17 (×3): 2 via ORAL
  Filled 2014-10-15: qty 2
  Filled 2014-10-15: qty 1
  Filled 2014-10-15 (×2): qty 2
  Filled 2014-10-15: qty 1
  Filled 2014-10-15: qty 2

## 2014-10-15 MED ORDER — LACTATED RINGERS IV SOLN
INTRAVENOUS | Status: DC | PRN
  Administered 2014-10-15: 17:00:00 via INTRAVENOUS

## 2014-10-15 SURGICAL SUPPLY — 69 items
APL SKNCLS STERI-STRIP NONHPOA (GAUZE/BANDAGES/DRESSINGS)
BANDAGE ELASTIC 3 VELCRO ST LF (GAUZE/BANDAGES/DRESSINGS) ×3 IMPLANT
BANDAGE ELASTIC 4 VELCRO ST LF (GAUZE/BANDAGES/DRESSINGS) ×3 IMPLANT
BENZOIN TINCTURE PRP APPL 2/3 (GAUZE/BANDAGES/DRESSINGS) IMPLANT
BIT DRILL 2.6 (BIT) ×2 IMPLANT
BLADE SURG 10 STRL SS (BLADE) ×2 IMPLANT
BNDG CMPR 9X4 STRL LF SNTH (GAUZE/BANDAGES/DRESSINGS) ×1
BNDG COHESIVE 4X5 TAN STRL (GAUZE/BANDAGES/DRESSINGS) ×3 IMPLANT
BNDG ESMARK 4X9 LF (GAUZE/BANDAGES/DRESSINGS) ×3 IMPLANT
BNDG GAUZE ELAST 4 BULKY (GAUZE/BANDAGES/DRESSINGS) ×3 IMPLANT
CLEANER TIP ELECTROSURG 2X2 (MISCELLANEOUS) ×3 IMPLANT
CLOSURE WOUND 1/2 X4 (GAUZE/BANDAGES/DRESSINGS)
COVER SURGICAL LIGHT HANDLE (MISCELLANEOUS) ×3 IMPLANT
CUFF TOURNIQUET SINGLE 18IN (TOURNIQUET CUFF) ×3 IMPLANT
DRAPE C-ARM 42X72 X-RAY (DRAPES) IMPLANT
DRAPE INCISE IOBAN 66X45 STRL (DRAPES) IMPLANT
DRAPE U-SHAPE 47X51 STRL (DRAPES) ×3 IMPLANT
DRSG ADAPTIC 3X8 NADH LF (GAUZE/BANDAGES/DRESSINGS) ×2 IMPLANT
DRSG PAD ABDOMINAL 8X10 ST (GAUZE/BANDAGES/DRESSINGS) ×2 IMPLANT
ELECT REM PT RETURN 9FT ADLT (ELECTROSURGICAL) ×3
ELECTRODE REM PT RTRN 9FT ADLT (ELECTROSURGICAL) ×1 IMPLANT
GAUZE SPONGE 4X4 12PLY STRL (GAUZE/BANDAGES/DRESSINGS) ×3 IMPLANT
GAUZE XEROFORM 5X9 LF (GAUZE/BANDAGES/DRESSINGS) ×3 IMPLANT
GLOVE BIO SURGEON STRL SZ7.5 (GLOVE) ×3 IMPLANT
GLOVE BIOGEL PI IND STRL 8 (GLOVE) ×1 IMPLANT
GLOVE BIOGEL PI INDICATOR 8 (GLOVE) ×2
GLOVE ORTHO TXT STRL SZ7.5 (GLOVE) ×3 IMPLANT
GOWN STRL REUS W/ TWL LRG LVL3 (GOWN DISPOSABLE) ×2 IMPLANT
GOWN STRL REUS W/ TWL XL LVL3 (GOWN DISPOSABLE) ×1 IMPLANT
GOWN STRL REUS W/TWL LRG LVL3 (GOWN DISPOSABLE) ×6
GOWN STRL REUS W/TWL XL LVL3 (GOWN DISPOSABLE) ×3
K-WIRE 1.6X150 (WIRE) ×3
K-WIRE FX150X1.6XKRSH (WIRE) ×1
KIT BASIN OR (CUSTOM PROCEDURE TRAY) ×3 IMPLANT
KIT ROOM TURNOVER OR (KITS) ×3 IMPLANT
KWIRE FX150X1.6XKRSH (WIRE) IMPLANT
MANIFOLD NEPTUNE II (INSTRUMENTS) ×3 IMPLANT
NS IRRIG 1000ML POUR BTL (IV SOLUTION) ×3 IMPLANT
PACK ORTHO EXTREMITY (CUSTOM PROCEDURE TRAY) ×3 IMPLANT
PAD ARMBOARD 7.5X6 YLW CONV (MISCELLANEOUS) ×4 IMPLANT
PAD CAST 4YDX4 CTTN HI CHSV (CAST SUPPLIES) ×1 IMPLANT
PADDING CAST COTTON 4X4 STRL (CAST SUPPLIES) ×6
PLATE OLECRANON 4H LEFT (Plate) ×2 IMPLANT
SCREW BONE 3.5X12 (Screw) ×2 IMPLANT
SCREW BONE 3.5X18MM (Screw) ×2 IMPLANT
SCREW BONE 3.5X20MM (Screw) ×2 IMPLANT
SCREW BONE 60MMX3.5MM (Screw) ×2 IMPLANT
SCREW LOCKING 12X3.5MM (Screw) ×2 IMPLANT
SCREW LOCKING 14MM (Screw) ×2 IMPLANT
SCREW NON LOCKING 22MM (Screw) ×2 IMPLANT
SPLINT PLASTER CAST XFAST 5X30 (CAST SUPPLIES) IMPLANT
SPLINT PLASTER XFAST SET 5X30 (CAST SUPPLIES) ×2
SPONGE LAP 18X18 X RAY DECT (DISPOSABLE) ×6 IMPLANT
STAPLER VISISTAT 35W (STAPLE) ×3 IMPLANT
STRIP CLOSURE SKIN 1/2X4 (GAUZE/BANDAGES/DRESSINGS) IMPLANT
SUCTION FRAZIER TIP 10 FR DISP (SUCTIONS) ×3 IMPLANT
SUT PROLENE 3 0 PS 2 (SUTURE) ×6 IMPLANT
SUT VIC AB 0 CT1 27 (SUTURE) ×6
SUT VIC AB 0 CT1 27XBRD ANBCTR (SUTURE) ×2 IMPLANT
SUT VIC AB 2-0 CT1 27 (SUTURE) ×6
SUT VIC AB 2-0 CT1 TAPERPNT 27 (SUTURE) ×2 IMPLANT
SYR CONTROL 10ML LL (SYRINGE) ×3 IMPLANT
TOWEL OR 17X24 6PK STRL BLUE (TOWEL DISPOSABLE) IMPLANT
TOWEL OR 17X26 10 PK STRL BLUE (TOWEL DISPOSABLE) ×3 IMPLANT
TOWEL OR NON WOVEN STRL DISP B (DISPOSABLE) ×3 IMPLANT
TUBE CONNECTING 12'X1/4 (SUCTIONS) ×1
TUBE CONNECTING 12X1/4 (SUCTIONS) ×2 IMPLANT
WATER STERILE IRR 1000ML POUR (IV SOLUTION) ×3 IMPLANT
YANKAUER SUCT BULB TIP NO VENT (SUCTIONS) ×3 IMPLANT

## 2014-10-15 NOTE — Op Note (Signed)
10/14/2014 - 10/15/2014  6:01 PM  PATIENT:  Kimberly Jordan    PRE-OPERATIVE DIAGNOSIS:   left olecranon fracture  POST-OPERATIVE DIAGNOSIS:  Same  PROCEDURE:  OPEN REDUCTION INTERNAL FIXATION (ORIF) ELBOW/OLECRANON FRACTURE  SURGEON:  Edmonia Lynch, D, MD  ASSISTANT: Joya Gaskins, OPA, He was necessary for efficiency and safety of the case.   ANESTHESIA:   Gen  PREOPERATIVE INDICATIONS:  Kimberly Jordan is a  80 y.o. female with a diagnosis of  left olecranon fracture who failed conservative measures and elected for surgical management.    The risks benefits and alternatives were discussed with the patient preoperatively including but not limited to the risks of infection, bleeding, nerve injury, cardiopulmonary complications, the need for revision surgery, among others, and the patient was willing to proceed.  OPERATIVE IMPLANTS: olecranon plate  OPERATIVE FINDINGS: fracture was not open  BLOOD LOSS: min  COMPLICATIONS: none  TOURNIQUET TIME: 70  OPERATIVE PROCEDURE:  Patient was identified in the preoperative holding area and site was marked by me She was transported to the operating theater and placed on the table in supine position taking care to pad all bony prominences. After a preincinduction time out anesthesia was induced. The left upper extremity was prepped and draped in normal sterile fashion and a pre-incision timeout was performed. She received ancef for preoperative antibiotics.   I examined the laceration to the posterior lateral aspect of her elbow and it did not penetrate through the fascia and did not track down to the fracture. I then made a state centimeter incision centered over the proximal olecranon and down the shaft of the ulna. I dissected down sharply to bone. Identified the fracture site and clean this out. I then selected an appropriate olecranon plate.  I used a clamp to hold the fracture in place and a sick K wire up the shaft of the ulna and  then placed a the plate over top of this.  Fix the plate distally compressing the fracture was much as possible into the proximal olecranon so we be prominent.  I then took x-rays to confirm that I did not over compress the fracture.  I then placed proximal locking screws in a home run screw through the plate. I took multiple x-rays at this point was happy with the fracture reduction as well as placement of all hardware.  I then thoroughly irrigated the wound and closed the fascia over top of the plate and closed the skin in layers. She was placed a sterile dressing and a long-arm splint.  POST OPERATIVE PLAN: sling and splint full time, ambulate for DVT px    This note was generated using a template and dragon dictation system. In light of that, I have reviewed the note and all aspects of it are applicable to this case. Any dictation errors are due to the computerized dictation system.

## 2014-10-15 NOTE — Discharge Instructions (Signed)
Keep your splint on full time  No use of the L hand but you can elevate it on pillows when in bed or chair, otherwise use a sling

## 2014-10-15 NOTE — Progress Notes (Addendum)
[facility] PROGRESS NOTE  Kimberly Jordan JAS:[id number] DOB: October 09, 1929 DOA: 10/14/2014 PCP: Tivis Ringer, MD  Assessment/Plan: 80 y/o female with PMH of HTN, h/o stress induced cardiomyopathy EF 35%, h/o breast cancer presenting after a mechanical fall and found to have a proximal ulnar complicated fracture on the left   1. Complicated left proximal ulnar fracture -per ortho plan surgery today; gentle IVF periop; close monitor for fluid overload   2. h/o stress induced cardiomyopathy EF 35%; Patient is nto on diuretics; clinically euvolemic;  -Pt denies any exertional symptoms; able to walk > 1-2 miles;  -cont BB, ASA periop; hold ARB; prn hydralazine  3. HTN, cont BB, hold ARB periop    Code Status: DNR Family Communication: d/w patient; no family at the bedside (indicate person spoken with, relationship, and if by phone, the number) Disposition Plan: pend clinical improvement    Consultants:  Ortho   Procedures:  pedn or  Antibiotics:  none (indicate start date, and stop date if known)  HPI/Subjective: alert  Objective: Filed Vitals:   10/15/14 0544  BP: 152/68  Pulse: 64  Temp: 98.2 F (36.8 C)  Resp: 14    Intake/Output Summary (Last 24 hours) at 10/15/14 1047 Last data filed at 10/15/14 0629  Gross per 24 hour  Intake      0 ml  Output    550 ml  Net   -550 ml   Filed Weights   10/15/14 0300  Weight: 47.174 kg (104 lb)    Exam:   General:  alert  Cardiovascular: s1,s2 rrr  Respiratory: CTA BL  Abdomen: soft, nt,nd   Musculoskeletal: no LE edema   Data Reviewed: Basic Metabolic Panel:  Recent Labs Lab 10/14/14 2154 10/15/14 0429  NA 139 140  K 4.0 3.4*  CL 99 100  CO2 27 28  GLUCOSE 140* 123*  BUN 20 17  CREATININE 0.67 0.64  CALCIUM 11.3* 9.9   Liver Function Tests: No results found for this basename: AST, ALT, ALKPHOS, BILITOT, PROT, ALBUMIN,  in the last 168 hours No results found for this basename:  LIPASE, AMYLASE,  in the last 168 hours No results found for this basename: AMMONIA,  in the last 168 hours CBC:  Recent Labs Lab 10/14/14 2154 10/15/14 0429  WBC 9.7 7.4  NEUTROABS 7.0  --   HGB 13.3 11.8*  HCT 37.6 34.0*  MCV 96.4 96.3  PLT 171 144*   Cardiac Enzymes: No results found for this basename: CKTOTAL, CKMB, CKMBINDEX, TROPONINI,  in the last 168 hours BNP (last 3 results)  Recent Labs  03/25/14 0815 04/06/14 1458  PROBNP 10991.0* 80.0   CBG: No results found for this basename: GLUCAP,  in the last 168 hours  No results found for this or any previous visit (from the past 240 hour(s)).   Studies: No results found.  Scheduled Meds: . carvedilol  12.5 mg Oral BID WC  . enoxaparin (LOVENOX) injection  30 mg Subcutaneous Daily  . irbesartan  75 mg Oral Daily  . simvastatin  20 mg Oral Daily  . sodium chloride  3 mL Intravenous Q12H  . Teriparatide (Recombinant)  20 mcg Subcutaneous Daily   Continuous Infusions:   Active Problems:   Open arm fracture    Time spent: >35 minutes     Kinnie Feil  [facility] Pager [phone]. If 7PM-7AM, please contact night-coverage at [email or web address], password [facility] 10/15/2014, 10:47 AM  LOS: 1 day

## 2014-10-15 NOTE — ED Provider Notes (Signed)
I have personally seen and examined the patient.  I have discussed the plan of care with the resident.  I have reviewed the documentation on PMH/FH/Soc. History.  I have reviewed the documentation of the resident and agree.  I spoke to orthopedist dr Percell Miller who is already aware of patient and will perform operative repair Pt stable in the ED  Kimberly Cable, MD 10/15/14 1231

## 2014-10-15 NOTE — Anesthesia Procedure Notes (Addendum)
Procedure Name: LMA Insertion Date/Time: 10/15/2014 4:55 PM Performed by: Raphael Gibney T Pre-anesthesia Checklist: Patient identified, Emergency Drugs available, Suction available, Patient being monitored and Timeout performed Patient Re-evaluated:Patient Re-evaluated prior to inductionOxygen Delivery Method: Circle system utilized and Simple face mask Preoxygenation: Pre-oxygenation with 100% oxygen Intubation Type: Combination inhalational/ intravenous induction Ventilation: Mask ventilation without difficulty LMA: LMA inserted LMA Size: 4.0 Number of attempts: 1 Airway Equipment and Method: Patient positioned with wedge pillow Tube secured with: Tape Dental Injury: Teeth and Oropharynx as per pre-operative assessment     Anesthesia Regional Block:  Supraclavicular block  Pre-Anesthetic Checklist: ,, timeout performed, Correct Patient, Correct Site, Correct Laterality, Correct Procedure, Correct Position, site marked, Risks and benefits discussed,  Surgical consent,  Pre-op evaluation,  At surgeon's request and post-op pain management  Laterality: Left  Prep: chloraprep       Needles:  Injection technique: Single-shot  Needle Type: Echogenic Stimulator Needle     Needle Length: 5cm 5 cm Needle Gauge: 22 and 22 G    Additional Needles:  Procedures: ultrasound guided (picture in chart) Supraclavicular block Narrative:  Start time: 10/15/2014 4:18 PM End time: 10/15/2014 4:28 PM Injection made incrementally with aspirations every 5 mL.  Performed by: Personally  Anesthesiologist: J. Tamela Gammon, MD  Additional Notes: Functioning IV was confirmed and monitors applied.  A 6mm 22ga echogenic arrow stimulator was used. Sterile prep and drape,hand hygiene and sterile gloves were used.Ultrasound guidance: relevant anatomy identified, needle position confirmed, local anesthetic spread visualized around nerve(s)., vascular puncture avoided.  Image printed for medical  record.  Negative aspiration and negative test dose prior to incremental administration of local anesthetic. The patient tolerated the procedure well.

## 2014-10-15 NOTE — Progress Notes (Signed)
Orthopedic Tech Progress Note Patient Details:  Kimberly Jordan [birth year] [id number] LUE placed in more extension as ordered in long arm splint Ortho Devices Type of Ortho Device: Ace wrap;Post (long arm) splint Ortho Device/Splint Location: LUE Ortho Device/Splint Interventions: Application   Asia R Thompson 10/15/2014, 12:18 PM

## 2014-10-15 NOTE — Anesthesia Preprocedure Evaluation (Signed)
Anesthesia Evaluation  Patient identified by MRN, date of birth, ID band Patient awake    Reviewed: Allergy & Precautions, H&P , NPO status , Patient's Chart, lab work & pertinent test results  History of Anesthesia Complications Negative for: history of anesthetic complications  Airway Mallampati: II TM Distance: >3 FB     Dental  (+) Teeth Intact, Dental Advisory Given   Pulmonary former smoker,    Pulmonary exam normal       Cardiovascular hypertension, Pt. on medications and Pt. on home beta blockers     Neuro/Psych negative neurological ROS  negative psych ROS   GI/Hepatic negative GI ROS, Neg liver ROS,   Endo/Other  negative endocrine ROS  Renal/GU negative Renal ROS     Musculoskeletal   Abdominal   Peds  Hematology   Anesthesia Other Findings   Reproductive/Obstetrics                           Anesthesia Physical Anesthesia Plan  ASA: III  Anesthesia Plan: General   Post-op Pain Management:    Induction: Intravenous  Airway Management Planned: LMA  Additional Equipment:   Intra-op Plan:   Post-operative Plan: Extubation in OR  Informed Consent: I have reviewed the patients History and Physical, chart, labs and discussed the procedure including the risks, benefits and alternatives for the proposed anesthesia with the patient or authorized representative who has indicated his/her understanding and acceptance.   Dental advisory given  Plan Discussed with: CRNA, Anesthesiologist and Surgeon  Anesthesia Plan Comments:         Anesthesia Quick Evaluation

## 2014-10-15 NOTE — Progress Notes (Signed)
Utilization review completed. Lugene Hitt, RN, BSN. 

## 2014-10-15 NOTE — Transfer of Care (Signed)
Immediate Anesthesia Transfer of Care Note  Patient: Kimberly Jordan  Procedure(s) Performed: Procedure(s) with comments: OPEN REDUCTION INTERNAL FIXATION (ORIF) ELBOW/OLECRANON FRACTURE (Left) - [employer] elbow plate   Patient Location: PACU  Anesthesia Type:General  Level of Consciousness: awake and alert   Airway & Oxygen Therapy: Patient Spontanous Breathing and Patient connected to nasal cannula oxygen  Post-op Assessment: Report given to PACU RN and Post -op Vital signs reviewed and stable  Post vital signs: Reviewed and stable  Complications: No apparent anesthesia complications

## 2014-10-16 LAB — BASIC METABOLIC PANEL
Anion gap: 16 — ABNORMAL HIGH (ref 5–15)
BUN: 18 mg/dL (ref 6–23)
CO2: 23 mEq/L (ref 19–32)
CREATININE: 0.63 mg/dL (ref 0.50–1.10)
Calcium: 9.1 mg/dL (ref 8.4–10.5)
Chloride: 96 mEq/L (ref 96–112)
GFR, EST NON AFRICAN AMERICAN: 81 mL/min — AB (ref >90)
Glucose, Bld: 100 mg/dL — ABNORMAL HIGH (ref 70–99)
Potassium: 3.8 mEq/L (ref 3.7–5.3)
Sodium: 135 mEq/L — ABNORMAL LOW (ref 137–147)

## 2014-10-16 LAB — SURGICAL PCR SCREEN
MRSA, PCR: NEGATIVE
Staphylococcus aureus: NEGATIVE

## 2014-10-16 MED ORDER — IRBESARTAN 75 MG PO TABS
75.0000 mg | ORAL_TABLET | Freq: Every day | ORAL | Status: DC
  Administered 2014-10-16 – 2014-10-18 (×3): 75 mg via ORAL
  Filled 2014-10-16 (×4): qty 1

## 2014-10-16 NOTE — Progress Notes (Signed)
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 Complicated left proximal ulnar fracture -10/16 d/p : ORIF; per ortho -? SNF vs HHC per PT/OT  2. h/o stress induced cardiomyopathy EF 35%; Patient is nto on diuretics; clinically euvolemic;  -Pt denies any exertional symptoms; able to walk > 1-2 miles;  -cont BB, ASA periop;  3. HTN, cont BB, resume ARB; prn hydralazine     Code Status: DNR Family Communication: d/w patient; no family at the bedside (indicate person spoken with, relationship, and if by phone, the number) Disposition Plan: pend clinical improvement    Consultants:  Ortho   Procedures:  pedn or  Antibiotics:  none (indicate start date, and stop date if known)  HPI/Subjective: alert  Objective: Filed Vitals:   10/16/14 0554  BP: 157/69  Pulse:   Temp:   Resp:     Intake/Output Summary (Last 24 hours) at 10/16/14 1138 Last data filed at 10/16/14 0700  Gross per 24 hour  Intake 1311.67 ml  Output     50 ml  Net 1261.67 ml   Filed Weights   10/15/14 0300  Weight: 47.174 kg (104 lb)    Exam:   General:  alert  Cardiovascular: s1,s2 rrr  Respiratory: CTA BL  Abdomen: soft, nt,nd   Musculoskeletal: no LE edema   Data Reviewed: Basic Metabolic Panel:  Recent Labs Lab 10/14/14 2154 10/15/14 0429 10/16/14 0355  NA 139 140 135*  K 4.0 3.4* 3.8  CL 99 100 96  CO2 27 28 23   GLUCOSE 140* 123* 100*  BUN 20 17 18   CREATININE 0.67 0.64 0.63  CALCIUM 11.3* 9.9 9.1   Liver Function Tests: No results found for this basename: AST, ALT, ALKPHOS, BILITOT, PROT, ALBUMIN,  in the last 168 hours No results found for this basename: LIPASE, AMYLASE,  in  the last 168 hours No results found for this basename: AMMONIA,  in the last 168 hours CBC:  Recent Labs Lab 10/14/14 2154 10/15/14 0429  WBC 9.7 7.4  NEUTROABS 7.0  --   HGB 13.3 11.8*  HCT 37.6 34.0*  MCV 96.4 96.3  PLT 171 144*   Cardiac Enzymes: No results found for this basename: CKTOTAL, CKMB, CKMBINDEX, TROPONINI,  in the last 168 hours BNP (last 3 results)  Recent Labs  03/25/14 0815 04/06/14 1458  PROBNP 10991.0* 80.0   CBG: No results found for this basename: GLUCAP,  in the last 168 hours  Recent Results (from the past 240 hour(s))  SURGICAL PCR SCREEN     Status: None   Collection Time    10/15/14  3:09 PM      Result Value Ref Range Status   MRSA, PCR NEGATIVE  NEGATIVE Final   Staphylococcus aureus NEGATIVE  NEGATIVE Final   Comment:            The Xpert SA Assay (FDA     approved for NASAL specimens     in patients over 26 years of age),     is one component of     a comprehensive surveillance     program.  Test performance has     been validated by [employer] for patients greater  than or equal to 109 year old.     It is not intended     to diagnose infection nor to     guide or monitor treatment.     Studies: Dg Elbow 2 Views Left  10/16/2014   CLINICAL DATA:  Fracture.  EXAM: LEFT ELBOW - 2 VIEW  COMPARISON:  None.  FINDINGS: The patient is in a splint, which decreases fine bony detail. There is fixation hardware across an olecranon process fracture. No adverse features. Small bone fragment at the anterior joint line of indeterminate chronicity. As permitted by overlapping artifact, no definite radial head abnormality. The distal humerus is intact.  IMPRESSION: Olecranon fracture status post ORIF.  No adverse findings.   Electronically Signed   By: Jorje Guild M.D.   On: 10/16/2014 03:10    Scheduled Meds: . carvedilol  12.5 mg Oral BID WC  .  ceFAZolin (ANCEF) IV  2 g Intravenous Q6H  . enoxaparin (LOVENOX) injection  30 mg  Subcutaneous Daily  . simvastatin  20 mg Oral Daily  . sodium chloride  3 mL Intravenous Q12H  . Teriparatide (Recombinant)  20 mcg Subcutaneous Daily   Continuous Infusions: . 0.9 % NaCl with KCl 20 mEq / L    . lactated ringers 10 mL/hr at 10/15/14 1550    Active Problems:   Open arm fracture    Time spent: >35 minutes     Kinnie Feil  [facility] Pager [phone]. If 7PM-7AM, please contact night-coverage at [email or web address], password [facility] 10/16/2014, 11:38 AM  LOS: 2 days

## 2014-10-16 NOTE — Evaluation (Signed)
Occupational Therapy Evaluation Patient Details Name: Kimberly Jordan MRN: [id number] DOB: 08/10/1929 Today's Date: 10/16/2014    History of Present Illness s/p ORIF left elbow/olecranon fracture; h/o cardiomyopathy with ejection fraction 35% on her last echo this year, hypertension and right breast cancer    Clinical Impression   Pt admitted with the above diagnoses and presents with below problem list. Pt will benefit from continued OT to address the below listed deficits and maximize independence with BADLs. PTA pt was mod I for ADLs with occasional use of cane. Pt currently at min A to mod A for ADLs. Pt lives with spouse who has physical limitations and unable to provide physical assistance. Recommending SNF based on performance this session. Acute OT to continue to follow and update recommendations as needed.     Follow Up Recommendations  SNF    Equipment Recommendations  Other (comment) (if pt goes home will need a 3n1)    Recommendations for Other Services PT consult     Precautions / Restrictions Precautions Required Braces or Orthoses: Sling      Mobility Bed Mobility Overal bed mobility: Needs Assistance Bed Mobility: Supine to Sit;Sit to Supine     Supine to sit: HOB elevated;Min guard Sit to supine: Min guard;HOB elevated   General bed mobility comments: Pt able to complete bed mobility tasks without physical assistance  Transfers Overall transfer level: Needs assistance   Transfers: Sit to/from Stand Sit to Stand: Min assist         General transfer comment: min A for balance    Balance Overall balance assessment: Needs assistance Sitting-balance support: Feet supported;No upper extremity supported Sitting balance-Leahy Scale: Fair     Standing balance support: Single extremity supported Standing balance-Leahy Scale: Poor Standing balance comment: sit<>stand at bedside with pt utilizind bed rails for supprt in standing, pt c/o dizziness in  standing and needing to sit after 2 minutes                            ADL Overall ADL's : Needs assistance/impaired Eating/Feeding: Set up;Sitting   Grooming: Set up;Sitting   Upper Body Bathing: Moderate assistance;Sitting   Lower Body Bathing: Sit to/from stand;Minimal assistance   Upper Body Dressing : Minimal assistance;Sitting   Lower Body Dressing: Minimal assistance;Sit to/from stand   Toilet Transfer: Minimal assistance;Stand-pivot;BSC   Toileting- Clothing Manipulation and Hygiene: Minimal assistance;Sit to/from stand   Tub/ Shower Transfer: Minimal assistance;Stand-pivot;3 in 1     General ADL Comments: Pt at min A level for LB ADLs and transfers due to balance, difficulty transitioning weight from heels of feet. MD note states sling on full-time, educated patient to dress over sling. Paged MD for sling clarification regarding UB dressing and bathing.      Vision                 Additional Comments: h/o eye surgery   Perception     Praxis      Pertinent Vitals/Pain Pain Assessment: 0-10 Pain Score: 6  Pain Location: Left elbow Pain Descriptors / Indicators: Sharp Pain Intervention(s): Limited activity within patient's tolerance;Monitored during session;Patient requesting pain meds-RN notified;Repositioned     Hand Dominance Right   Extremity/Trunk Assessment Upper Extremity Assessment Upper Extremity Assessment: LUE deficits/detail LUE Deficits / Details: s/p ORIF elbow/olecranon fracture LUE: Unable to fully assess due to immobilization   Lower Extremity Assessment Lower Extremity Assessment: Defer to PT evaluation  Communication Communication Communication: No difficulties   Cognition Arousal/Alertness: Awake/alert Behavior During Therapy: WFL for tasks assessed/performed Overall Cognitive Status: Within Functional Limits for tasks assessed                     General Comments       Exercises        Shoulder Instructions      Home Living Family/patient expects to be discharged to:: Private residence Living Arrangements: Spouse/significant other Available Help at Discharge: Family;Available 24 hours/day Type of Home: House Home Access: Stairs to enter [employer] of Steps: 4 Entrance Stairs-Rails: Right;Left;Can reach both Home Layout: Two level;Able to live on main level with bedroom/bathroom     Bathroom Shower/Tub: Tub/shower unit;Curtain Shower/tub characteristics: Architectural technologist: Standard     Home Equipment: Shower seat;Grab bars - tub/shower;Other (comment)   Additional Comments: Spouse has physical limitations and is in rehab himself, daughter lives in [place]. PTA ambulated with occasional use of walking stick       Prior Functioning/Environment Level of Independence: Independent with assistive device(s)        Comments: PTA ambulated with occasional use of walking stick     OT Diagnosis: Acute pain   OT Problem List: Impaired balance (sitting and/or standing);Decreased knowledge of use of DME or AE;Decreased knowledge of precautions;Impaired UE functional use;Pain   OT Treatment/Interventions: Self-care/ADL training;DME and/or AE instruction;Therapeutic activities;Patient/family education;Balance training    OT Goals(Current goals can be found in the care plan section) Acute Rehab OT Goals Patient Stated Goal: not stated OT Goal Formulation: With patient Time For Goal Achievement: 10/23/14 Potential to Achieve Goals: Good ADL Goals Pt Will Perform Upper Body Bathing: sitting;with modified independence Pt Will Perform Lower Body Bathing: with modified independence;sit to/from stand;with adaptive equipment Pt Will Perform Upper Body Dressing: with modified independence;sitting Pt Will Perform Lower Body Dressing: with modified independence;with adaptive equipment;sit to/from stand Pt Will Transfer to Toilet: with modified  independence;ambulating;bedside commode Pt Will Perform Toileting - Clothing Manipulation and hygiene: with modified independence;sit to/from stand Pt Will Perform Tub/Shower Transfer: with modified independence;ambulating;3 in 1;rolling walker  OT Frequency: Min 2X/week   Barriers to D/C: Decreased caregiver support;Other (comment) (spouse has physical limitations)  spouse has physical limitations       Co-evaluation              End of Session Equipment Utilized During Treatment: Other (comment) (sling)  Activity Tolerance: Patient tolerated treatment well;Patient limited by pain;Other (comment) (Pt limited by dizziness in standing.) Patient left: in bed;with call bell/phone within reach   Time: 7829-5621 OT Time Calculation (min): 32 min Charges:  OT General Charges $OT Visit: 1 Procedure OT Evaluation $Initial OT Evaluation Tier I: 1 Procedure OT Treatments $Self Care/Home Management : 8-22 mins G-Codes:    Hortencia Pilar 11-09-2014, 9:55 AM

## 2014-10-16 NOTE — Progress Notes (Signed)
[facility] Progress Note Weekend Coverage   Subjective  Doing well Pain tolerable No specific complaints   Review of Systems  Constitutional: Negative for fever and chills.  Respiratory: Negative for shortness of breath and wheezing.   Cardiovascular: Negative for chest pain and palpitations.  Gastrointestinal: Negative for nausea, vomiting and abdominal pain.  Neurological: Negative for tingling and sensory change.     Objective    BP 157/69  Pulse 77  Temp(Src) 97.5 F (36.4 C) (Oral)  Resp 18  Wt 47.174 kg (104 lb)  SpO2 96%  LMP 12/31/1992  Intake/Output     10/16 0701 - 10/17 0700 10/17 0701 - 10/18 0700   P.O. 360    I.V. (mL/kg) 851.7 (18.1)    IV Piggyback 100    Total Intake(mL/kg) 1311.7 (27.8)    Urine (mL/kg/hr) 450 (0.4)    Blood 50 (0)    Total Output 500     Net +811.7          Urine Occurrence 5 x      Labs  Results for Kimberly Jordan, Kimberly Jordan (MRN [id number]) as of 10/16/2014 12:31  Ref. Range 10/16/2014 03:55  Sodium Latest Range: 137-147 mEq/L 135 (L)  Potassium Latest Range: 3.7-5.3 mEq/L 3.8  Chloride Latest Range: 96-112 mEq/L 96  CO2 Latest Range: 19-32 mEq/L 23  BUN Latest Range: 6-23 mg/dL 18  Creatinine Latest Range: 0.50-1.10 mg/dL 0.63  Calcium Latest Range: 8.4-10.5 mg/dL 9.1  GFR calc non Af Amer Latest Range: >90 mL/min 81 (L)  GFR calc Af Amer Latest Range: >90 mL/min >90  Glucose Latest Range: 70-99 mg/dL 100 (H)  Anion gap Latest Range: 5-15  16 (H)    Exam  Gen: sitting in chair, appears comfortable, pleasant Ext:   Left Upper Extremity    Dressing and splint c/d/i   Sling fitting well   R/U/M/AIN/PIN motor intact   R/U/M sensation intact   Ext warm   Extensive ecchymosis distally   Swelling controlled   Moves fingers well    Assessment and Plan   POD/HD#: 1   80 y/o RHD white female s/p ORIF L olecranon fx   1. L olecranon fracture s/p ORIF  Continue with splint and sling  Finger motion  as tolerated  Ice prn  OT/PT evals  NWB L upper extremity   2. Pain management:  Continue with current regimen   3. Medical issues   Per medicine service  4. DVT/PE prophylaxis:  Mobilize  Pharmacologics not needed at dc from ortho standpoint  5. ID:   Completed periop abx   6 Dispo:  Ortho issues stable  Continue per IM   Ok for DC when medically stable   Follow up with Ortho in 7 days   Jari Pigg, PA-C [facility] [phone] (O) 10/16/2014 12:30 PM

## 2014-10-16 NOTE — Evaluation (Signed)
Physical Therapy Evaluation Patient Details Name: Kimberly Jordan MRN: [id number] DOB: 1929/02/15 Today's Date: 10/16/2014   History of Present Illness  s/p ORIF left elbow/olecranon fracture; h/o cardiomyopathy with ejection fraction 35% on her last echo this year, hypertension and right breast cancer   Clinical Impression  Pt admitted with above diagnoses from a fall.  No history of falls prior to yesterday. Pt currently with functional limitations due to the deficits listed below (see PT Problem List). Pt will benefit from skilled PT to increase their independence and safety with mobility to allow discharge to home. If balance deficits persist over the few days then may need SNF for short term rehab.  Pt was very active PTA.     Follow Up Recommendations Home health PT;Supervision for mobility/OOB (May need SNF if pt does not progress well over next day or 2)    Equipment Recommendations  Cane    Recommendations for Other Services       Precautions / Restrictions Precautions Precautions: Fall Required Braces or Orthoses: Sling (LUE) Restrictions Weight Bearing Restrictions: Yes LUE Weight Bearing: Non weight bearing      Mobility  Bed Mobility Overal bed mobility: Modified Independent Bed Mobility: Supine to Sit     Supine to sit: HOB elevated;Min guard Sit to supine: Min guard;HOB elevated   General bed mobility comments: Used rail and sat up without difficulty.   Transfers Overall transfer level: Needs assistance Equipment used: None Transfers: Sit to/from Stand Sit to Stand: Min assist         General transfer comment: min A for balance  Ambulation/Gait Ambulation/Gait assistance: Min assist Ambulation Distance (Feet): 20 Feet (also ambulated 15 feet) Assistive device: 1 person hand held assist Gait Pattern/deviations: Step-to pattern;Shuffle   Gait velocity interpretation: Below normal speed for age/gender General Gait Details: pt requires min A to  maintain balance with gait. C/O some dizziness when ambulating.   Stairs            Wheelchair Mobility    Modified Rankin (Stroke Patients Only)       Balance Overall balance assessment: Needs assistance Sitting-balance support: Feet supported;No upper extremity supported Sitting balance-Leahy Scale: Fair     Standing balance support: Single extremity supported Standing balance-Leahy Scale: Poor Standing balance comment: sit<>stand at bedside with pt utilizind bed rails for supprt in standing, pt c/o dizziness in standing and needing to sit after 2 minutes                             Pertinent Vitals/Pain Pain Assessment: 0-10 Pain Score: 6  Pain Location: left elbow Pain Descriptors / Indicators: Sharp Pain Intervention(s): Limited activity within patient's tolerance;Monitored during session;Repositioned    Home Living Family/patient expects to be discharged to:: Private residence Living Arrangements: Spouse/significant other Available Help at Discharge: Family;Available 24 hours/day Type of Home: House Home Access: Stairs to enter Entrance Stairs-Rails: Right;Left;Can reach both Entrance Stairs-Number of Steps: 4 Home Layout: Two level;Able to live on main level with bedroom/bathroom Home Equipment: Other (comment) (walking stick for outdoor use) Additional Comments: Spouse has physical limitations and is in rehab himself, daughter lives in [place]. PTA ambulated with occasional use of walking stick     Prior Function Level of Independence: Independent         Comments: PTA ambulated with occasional use of walking stick      Hand Dominance   Dominant Hand: Right    Extremity/Trunk  Assessment   Upper Extremity Assessment: Defer to OT evaluation       LUE Deficits / Details: s/p ORIF elbow/olecranon fracture   Lower Extremity Assessment: Overall WFL for tasks assessed         Communication   Communication: No difficulties   Cognition Arousal/Alertness: Awake/alert Behavior During Therapy: WFL for tasks assessed/performed Overall Cognitive Status: Within Functional Limits for tasks assessed                      General Comments General comments (skin integrity, edema, etc.): No fmaily present for session. Pt alert and cooperative.      Exercises        Assessment/Plan    PT Assessment Patient needs continued PT services  PT Diagnosis Difficulty walking   PT Problem List Decreased activity tolerance;Decreased balance;Decreased mobility;Decreased knowledge of use of DME;Pain  PT Treatment Interventions DME instruction;Gait training;Stair training;Therapeutic exercise;Therapeutic activities;Balance training;Patient/family education   PT Goals (Current goals can be found in the Care Plan section) Acute Rehab PT Goals Patient Stated Goal: To walk better PT Goal Formulation: With patient Time For Goal Achievement: 10/23/14 Potential to Achieve Goals: Good    Frequency Min 5X/week   Barriers to discharge        Co-evaluation               End of Session Equipment Utilized During Treatment: Gait belt Activity Tolerance: Patient tolerated treatment well Patient left: in chair;with call bell/phone within reach Nurse Communication: Mobility status         Time: 1026-1055 PT Time Calculation (min): 29 min   Charges:   PT Evaluation $Initial PT Evaluation Tier I: 1 Procedure PT Treatments $Gait Training: 8-22 mins   PT G Codes:          Melvern Banker 10/16/2014, 11:09 AM Lavonia Dana, PT  [phone] 10/16/2014

## 2014-10-17 MED ORDER — SENNOSIDES-DOCUSATE SODIUM 8.6-50 MG PO TABS
1.0000 | ORAL_TABLET | Freq: Every day | ORAL | Status: DC
  Administered 2014-10-17 – 2014-10-18 (×2): 1 via ORAL
  Filled 2014-10-17: qty 1

## 2014-10-17 NOTE — Progress Notes (Signed)
Orthopaedic Trauma Service Progress Note Weekend Coverage   Subjective  Doing well this am No specific complaints   Review of Systems  Constitutional: Negative for fever and chills.  Respiratory: Negative for shortness of breath and wheezing.   Cardiovascular: Negative for chest pain and palpitations.  Gastrointestinal: Negative for nausea, vomiting and abdominal pain.  Neurological: Negative for tingling and sensory change.     Objective   BP 128/68  Pulse 64  Temp(Src) 98.7 F (37.1 C) (Oral)  Resp 16  Wt 47.174 kg (104 lb)  SpO2 98%  LMP 12/31/1992  Intake/Output     10/17 0701 - 10/18 0700 10/18 0701 - 10/19 0700   P.O. 720    I.V. (mL/kg)     IV Piggyback     Total Intake(mL/kg) 720 (15.3)    Urine (mL/kg/hr) 225 (0.2)    Blood     Total Output 225     Net +495          Urine Occurrence 3 x      Labs  No new labs  Exam  Gen: awake and alert, resting comfortably in bed, NAD  Ext:        Left Upper Extremity                           Dressing reinforced    Splint fitting well, skin looks stable proximally and distally                          R/U/M/AIN/PIN motor intact                         R/U/M sensation intact                         Ext warm                         Extensive ecchymosis distally                         moderate swelling distally                          Moves fingers well    Assessment and Plan   POD/HD#: 2   80 y/o RHD white female s/p ORIF L olecranon fx   1. L olecranon fracture s/p ORIF             Continue with splint and sling x 5 more days   Sling can be off when pt in bed              Finger motion as tolerated  Elevate arm above heart as much as possible to help with swelling              Ice prn             OT/PT evals             NWB L upper extremity   2. Pain management:             Continue with current regimen   3. Medical issues               Per medicine service  4. DVT/PE prophylaxis:  Mobilize             Pharmacologics not needed at dc from ortho standpoint  5. ID:               Completed periop abx   6 Dispo:             Ortho issues stable             Continue per IM               Ok for DC when medically stable               Follow up with Ortho in 7 days from surgery     Jari Pigg, PA-C [facility] [phone] (O) 10/17/2014 9:39 AM

## 2014-10-17 NOTE — Progress Notes (Signed)
Physical Therapy Treatment Patient Details Name: DELBERTA FOLTS MRN: [id number] DOB: 1929-07-04 Today's Date: 10/17/2014    History of Present Illness s/p ORIF left elbow/olecranon fracture; h/o cardiomyopathy with ejection fraction 35% on her last echo this year, hypertension and right breast cancer     PT Comments    Making progress with mobility and activity tolerance, but it is slow progress; Lengthy discussion re: dc options, and pt would rather go home; given her current functional status, I would favor her going home, provided 24 hour assist can be arranged for her (if 24 hour assist cannot be arranged, must consider Short-term SNF)  Case Mgr notified   Follow Up Recommendations  Home health PT;Supervision for mobility/OOB (May need SNF if unable to arrange for 24 hour assist at home)     Equipment Recommendations  Cane    Recommendations for Other Services       Precautions / Restrictions Precautions Precautions: Fall Required Braces or Orthoses: Sling (LUE) Restrictions Weight Bearing Restrictions: Yes LUE Weight Bearing: Non weight bearing    Mobility  Bed Mobility                  Transfers Overall transfer level: Needs assistance Equipment used: None Transfers: Sit to/from Stand Sit to Stand: Min assist;Min guard         General transfer comment: min A for balance; cues to use RUE prn for blance  Ambulation/Gait Ambulation/Gait assistance: Min assist Ambulation Distance (Feet): 70 Feet Assistive device: 1 person hand held assist Gait Pattern/deviations: Step-through pattern;Decreased stride length     General Gait Details: pt requires min A to maintain balance with gait. No reports of dizziness   Stairs            Wheelchair Mobility    Modified Rankin (Stroke Patients Only)       Balance Overall balance assessment: Needs assistance           Standing balance-Leahy Scale: Poor                       Cognition Arousal/Alertness: Awake/alert Behavior During Therapy: WFL for tasks assessed/performed Overall Cognitive Status: Within Functional Limits for tasks assessed                      Exercises      General Comments General comments (skin integrity, edema, etc.): continued L Hand edema; attempted repositioning for more elevation      Pertinent Vitals/Pain Pain Assessment: 0-10 Pain Score: 6  Pain Location: Left elbow Pain Descriptors / Indicators: Sharp Pain Intervention(s): Limited activity within patient's tolerance;Repositioned (checked sling fit)    Home Living                      Prior Function            PT Goals (current goals can now be found in the care plan section) Acute Rehab PT Goals Patient Stated Goal: To walk better PT Goal Formulation: With patient Time For Goal Achievement: 10/23/14 Potential to Achieve Goals: Good Progress towards PT goals: Progressing toward goals    Frequency  Min 5X/week    PT Plan Current plan remains appropriate    Co-evaluation             End of Session Equipment Utilized During Treatment: Gait belt Activity Tolerance: Patient tolerated treatment well Patient left: in chair;with call bell/phone within reach  Time: 1771-1657 PT Time Calculation (min): 18 min  Charges:  $Gait Training: 8-22 mins                    G Codes:      Roney Marion Hamff 10/17/2014, 11:42 AM  Roney Marion, PT  Acute Rehabilitation Services Pager [phone] Office [phone]

## 2014-10-17 NOTE — Progress Notes (Signed)
[facility] PROGRESS NOTE  Kimberly Jordan DXI:[id number] DOB: 04-Mar-1929 DOA: 10/14/2014 PCP: Tivis Ringer, MD  Assessment/Plan: 80 y/o female with PMH of HTN, h/o stress induced cardiomyopathy EF 35%, h/o breast cancer presenting after a mechanical fall and found to have a proximal ulnar complicated fracture on the left   1. Complicated left proximal ulnar fracture -10/16 s/p : ORIF; per ortho; outpatient follow up  -? SNF vs HHC per PT/OT, awaiting final recs  2. h/o stress induced cardiomyopathy EF 35%; Patient is nto on diuretics; clinically euvolemic;  -Pt denies any exertional symptoms; able to walk > 1-2 miles;  -cont BB, ASA periop;  3. HTN, cont BB, resume ARB; prn hydralazine     Code Status: DNR Family Communication: d/w patient; no family at the bedside (indicate person spoken with, relationship, and if by phone, the number) Disposition Plan: pend PT/OT; consult SW   Consultants:  Ortho   Procedures:  pedn or  Antibiotics:  none (indicate start date, and stop date if known)  HPI/Subjective: alert  Objective: Filed Vitals:   10/17/14 0537  BP: 128/68  Pulse: 64  Temp: 98.7 F (37.1 C)  Resp: 16    Intake/Output Summary (Last 24 hours) at 10/17/14 0913 Last data filed at 10/17/14 0544  Gross per 24 hour  Intake    480 ml  Output    225 ml  Net    255 ml   Filed Weights   10/15/14 0300  Weight: 47.174 kg (104 lb)    Exam:   General:  alert  Cardiovascular: s1,s2 rrr  Respiratory: CTA BL  Abdomen: soft, nt,nd   Musculoskeletal: no LE edema   Data Reviewed: Basic Metabolic Panel:  Recent Labs Lab 10/14/14 2154 10/15/14 0429 10/16/14 0355  NA 139 140 135*  K 4.0 3.4* 3.8  CL 99 100 96  CO2 27 28 23   GLUCOSE 140* 123* 100*  BUN 20 17 18   CREATININE 0.67 0.64 0.63  CALCIUM 11.3* 9.9 9.1   Liver Function Tests: No results found for this basename: AST, ALT, ALKPHOS, BILITOT, PROT, ALBUMIN,  in the last 168  hours No results found for this basename: LIPASE, AMYLASE,  in the last 168 hours No results found for this basename: AMMONIA,  in the last 168 hours CBC:  Recent Labs Lab 10/14/14 2154 10/15/14 0429  WBC 9.7 7.4  NEUTROABS 7.0  --   HGB 13.3 11.8*  HCT 37.6 34.0*  MCV 96.4 96.3  PLT 171 144*   Cardiac Enzymes: No results found for this basename: CKTOTAL, CKMB, CKMBINDEX, TROPONINI,  in the last 168 hours BNP (last 3 results)  Recent Labs  03/25/14 0815 04/06/14 1458  PROBNP 10991.0* 80.0   CBG: No results found for this basename: GLUCAP,  in the last 168 hours  Recent Results (from the past 240 hour(s))  SURGICAL PCR SCREEN     Status: None   Collection Time    10/15/14  3:09 PM      Result Value Ref Range Status   MRSA, PCR NEGATIVE  NEGATIVE Final   Staphylococcus aureus NEGATIVE  NEGATIVE Final   Comment:            The Xpert SA Assay (FDA     approved for NASAL specimens     in patients over 3 years of age),     is one component of     a comprehensive surveillance     program.  Test performance has  been validated by [facility] for patients greater     than or equal to 48 year old.     It is not intended     to diagnose infection nor to     guide or monitor treatment.     Studies: Dg Elbow 2 Views Left  10/16/2014   CLINICAL DATA:  Fracture.  EXAM: LEFT ELBOW - 2 VIEW  COMPARISON:  None.  FINDINGS: The patient is in a splint, which decreases fine bony detail. There is fixation hardware across an olecranon process fracture. No adverse features. Small bone fragment at the anterior joint line of indeterminate chronicity. As permitted by overlapping artifact, no definite radial head abnormality. The distal humerus is intact.  IMPRESSION: Olecranon fracture status post ORIF.  No adverse findings.   Electronically Signed   By: Jorje Guild M.D.   On: 10/16/2014 03:10    Scheduled Meds: . carvedilol  12.5 mg Oral BID WC  . enoxaparin (LOVENOX)  injection  30 mg Subcutaneous Daily  . irbesartan  75 mg Oral Daily  . simvastatin  20 mg Oral Daily  . sodium chloride  3 mL Intravenous Q12H  . Teriparatide (Recombinant)  20 mcg Subcutaneous Daily   Continuous Infusions: . lactated ringers 10 mL/hr at 10/15/14 1550    Active Problems:   Open arm fracture    Time spent: >35 minutes     Kinnie Feil  [facility] Pager [phone]. If 7PM-7AM, please contact night-coverage at [email or web address], password [facility] 10/17/2014, 9:13 AM  LOS: 3 days

## 2014-10-18 NOTE — Anesthesia Postprocedure Evaluation (Signed)
Anesthesia Post Note  Patient: Kimberly Jordan  Procedure(s) Performed: Procedure(s) (LRB): OPEN REDUCTION INTERNAL FIXATION (ORIF) ELBOW/OLECRANON FRACTURE (Left)  Anesthesia type: general  Patient location: PACU  Post pain: Pain level controlled  Post assessment: Patient's Cardiovascular Status Stable  Last Vitals:  Filed Vitals:   10/18/14 0642  BP: 150/49  Pulse: 61  Temp: 36.5 C  Resp:     Post vital signs: Reviewed and stable  Level of consciousness: sedated  Complications: No apparent anesthesia complications

## 2014-10-18 NOTE — Progress Notes (Signed)
Orthopedic Tech Progress Note Patient Details:  Kimberly Jordan 31-Aug-1929 [id number] [employer] Type of Ortho Device: Arm sling Ortho Device/Splint Location: LUE Ortho Device/Splint Interventions: Application   Kimberly Jordan 10/18/2014, 12:48 PM

## 2014-10-18 NOTE — Care Management Note (Signed)
CARE MANAGEMENT NOTE 10/18/2014  Patient:  Kimberly Jordan, Kimberly Jordan   Account Number:  [id number]  Date Initiated:  10/18/2014  Documentation initiated by:  Ricki Miller  Subjective/Objective Assessment:   80 yr old female admitted with left open olecrenon fracture, patient underwent Left olecrenon ORIF.     Action/Plan:   patient will go home with Home health.   Anticipated DC Date:  10/18/2014   Anticipated DC Plan:  [place]  CM consult      [facility] Choice  HOME HEALTH   Choice offered to / List presented to:  C-1 Patient   DME arranged  NA        [place] arranged  [place].   Status of service:  Completed, signed off Medicare Important Message given?  YES (If response is "NO", the following Medicare IM given date fields will be blank) Date Medicare IM given:  10/18/2014 Medicare IM given by:  Ricki Miller Date Additional Medicare IM given:   Additional Medicare IM given by:    Discharge Disposition:  [place]  Per UR Regulation:  Reviewed for med. necessity/level of care/duration of stay  If discussed at [place] of Stay Meetings, dates discussed:    Comments:  10/18/14 3:40pm Ricki Miller, RN BSN Case Manager Case Manager spoke with patient and her friend, Shauna Hugh. Discussed discharge plan, and home health needs. Choice was offered to patient, and her daughter Rexford Maus, was contacted [phone]) to confirm home situation.patient has a single point cane at home and bathroom is handicap assessible. Address confirmed with patient. Referral called to [place], [facility] Liaison. 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 Case manager also provided Mrs. Zenia Resides with Private duty care agencies list.

## 2014-10-18 NOTE — Progress Notes (Signed)
Occupational Therapy Treatment Patient Details Name: Kimberly Jordan MRN: [id number] DOB: [birth year] Today's Date: 10/18/2014    History of present illness s/p ORIF left elbow/olecranon fracture; h/o cardiomyopathy with ejection fraction 35% on her last echo this year, hypertension and right breast cancer    OT comments  This 80 yo female admitted and underwent above presents to acute OT making progress towards goals and now feel it is appropriate for pt to go home with HHOT and 24 hour S of husband and/or daughter. Per pt her husband is taking OP therapy, but can take care of himself and S her.   Follow Up Recommendations  Home health OT    Equipment Recommendations  3 in 1 bedside comode       Precautions / Restrictions Precautions Precautions: Fall Precaution Comments: Fall risk is lessening with more practice with walking Required Braces or Orthoses: Sling Restrictions Weight Bearing Restrictions: Yes LUE Weight Bearing: Non weight bearing       Mobility Bed Mobility Overal bed mobility: Needs Assistance Bed Mobility: Sit to Supine       Sit to supine: Supervision      Transfers Overall transfer level: Needs assistance Equipment used: Straight cane Transfers: Sit to/from Stand Sit to Stand: Supervision                ADL Overall ADL's : Needs assistance/impaired                     Lower Body Dressing: Supervision/safety;Sit to/from stand   Toilet Transfer: Supervision/safety;Ambulation;Regular Toilet;Grab bars   Toileting- Clothing Manipulation and Hygiene: Supervision/safety;Sit to/from stand                          Cognition   Behavior During Therapy: [facility] for tasks assessed/performed Overall Cognitive Status: Within Functional Limits for tasks assessed                         Exercises Other Exercises Other Exercises: Instructed pt in flexion/extension of digits, proper positioning of sling, and shoulder ROM          Pertinent Vitals/ Pain       Pain Assessment: No/denies pain Faces Pain Scale: Hurts little more Pain Location: L elbow Pain Descriptors / Indicators: Aching Pain Intervention(s): Monitored during session;Repositioned         Frequency Min 2X/week     Progress Toward Goals  OT Goals(current goals can now be found in the care plan section)  Progress towards OT goals: Progressing toward goals  Acute Rehab OT Goals Patient Stated Goal: REALLY wants to go home  Plan Discharge plan needs to be updated          Activity Tolerance Patient tolerated treatment well   Patient Left in bed;with call bell/phone within reach;with family/visitor present           Time: 2878-6767 OT Time Calculation (min): 24 min  Charges: OT General Charges $OT Visit: 1 Procedure OT Treatments $Self Care/Home Management : 8-22 mins $Therapeutic Exercise: 8-22 mins  Almon Register [phone] 10/18/2014, 5:15 PM

## 2014-10-18 NOTE — Clinical Social Work Psychosocial (Signed)
Clinical Social Work Department BRIEF PSYCHOSOCIAL ASSESSMENT 10/18/2014  Patient:  Kimberly Jordan, Kimberly Jordan     Account Number:  [id number]     Admit date:  10/14/2014  Clinical Social Worker:  Delrae Sawyers  Date/Time:  10/18/2014 03:18 PM  Referred by:  Physician  Date Referred:  10/18/2014 Referred for  Other - See comment   Other Referral:   PT recommending home health PT with SNF as back-up for supervision.   Interview type:  Patient Other interview type:   CSW also spoke with pt's daughter, Kimberly Jordan.    PSYCHOSOCIAL DATA Living Status:  HUSBAND Admitted from facility:   Level of care:   Primary support name:  Kimberly Jordan Primary support relationship to patient:  SPOUSE Degree of support available:   Strong support system.    CURRENT CONCERNS Current Concerns  Post-Acute Placement   Other Concerns:   none.    SOCIAL WORK ASSESSMENT / PLAN CSW attempted to meet with pt to discuss discharge disposition. Pt stated she was deferring conversation with CSW until pt's husband and daughter were also present at bedside. CSW contacted pt's daughter regarding discharge disposition. Pt's daughter informed CSW pt and pt's husband would not be happy with pt being placed in SNF for rehabilitation. Pt's daughter stated pt's husband is limited with mobility, but pt's daughter works from home and is able to provide supervision on and off throughout the day.    CSW notified RNCM regarding information above. MD also updated regarding discharge disposition. CSW signing off.   Assessment/plan status:  Psychosocial Support/Ongoing Assessment of Needs Other assessment/ plan:   none.   Information/referral to community resources:   Pt discharging home with home health.    PATIENT'S/FAMILY'S RESPONSE TO PLAN OF CARE: Pt's daughter understanding of CSW plan of care, but prefers for pt to be discharged home with home health. Pt's daughter expressed no further questions or concerns  at this time.       Lubertha Sayres, [place] ([phone]) Licensed Clinical Social Worker Orthopedics [phone]) and Surgical [phone])

## 2014-10-18 NOTE — Discharge Summary (Signed)
Physician Discharge Summary  Kimberly Jordan:[id number] DOB: 1929/05/15 DOA: 10/14/2014  PCP: Tivis Ringer, MD  Admit date: 10/14/2014 Discharge date: 10/18/2014  Time spent: >35 minutes  Recommendations for Outpatient Follow-up:  HHC PT F/u with ortho in 7 days  F/u with PCP in 1 week as needed  Discharge Diagnoses:  Active Problems:   Open arm fracture   Discharge Condition: stable   Diet recommendation: low sodium   Filed Weights   10/15/14 0300  Weight: 47.174 kg (104 lb)    History of present illness:  80 y/o female with PMH of HTN, h/o stress induced cardiomyopathy EF 35%, h/o breast cancer presenting after a mechanical fall and found to have a proximal ulnar complicated fracture on the left   Hospital Course:  1. Complicated left proximal ulnar fracture  -10/16 s/p : ORIF; per ortho; outpatient follow up  -Pt, family preferred to go home with HHC/PT;  2. h/o stress induced cardiomyopathy EF 35%; Patient is nto on diuretics; clinically euvolemic;  -Pt denies any exertional symptoms; able to walk > 1-2 miles;  -cont BB, ASA  3. HTN, cont BB, resume ARB;    Procedures:  L ulnar orif (i.e. Studies not automatically included, echos, thoracentesis, etc; not x-rays)  Consultations:  ortho  Discharge Exam: Filed Vitals:   10/18/14 1521  BP: 134/39  Pulse: 82  Temp: 98.6 F (37 C)  Resp: 18    General: alert Cardiovascular: s1,s2 rrr Respiratory: CTA BL  Discharge Instructions  Discharge Instructions   Diet - low sodium heart healthy    Complete by:  As directed      Discharge instructions    Complete by:  As directed   Please follow up with orthopedics in 7 days     Increase activity slowly    Complete by:  As directed             Medication List         aspirin 81 MG tablet  Take 81 mg by mouth daily.     CALCIUM + D PO  Take 600 mg by mouth 3 (three) times daily.     carvedilol 12.5 MG tablet  Commonly known as:  COREG   Take 1 tablet (12.5 mg total) by mouth 2 (two) times daily with a meal.     CLEAR EYES NATURAL TEARS 5-6 MG/ML Soln  Generic drug:  Polyvinyl Alcohol-Povidone  Place 2 drops into both eyes daily as needed (dry eyes).     clobetasol cream 0.05 %  Commonly known as:  TEMOVATE  Apply 1 application topically as needed (For itching on back).     docusate sodium 100 MG capsule  Commonly known as:  COLACE  Take 1 capsule (100 mg total) by mouth 2 (two) times daily.     doxycycline 100 MG tablet  Commonly known as:  VIBRA-TABS  Take 100 mg by mouth every evening.     FORTEO 600 MCG/2.4ML Soln  Generic drug:  Teriparatide (Recombinant)  Inject 20 mcg into the skin daily.     HYDROcodone-acetaminophen 5-325 MG per tablet  Commonly known as:  NORCO  Take 1-2 tablets by mouth every 4 (four) hours as needed for moderate pain.     OVER THE COUNTER MEDICATION  Place 1 application into both eyes at bedtime. occu scrub     potassium chloride 10 MEQ tablet  Commonly known as:  K-DUR  Take 10 mEq by mouth 2 (two) times daily.  simvastatin 20 MG tablet  Commonly known as:  ZOCOR  Take 20 mg by mouth every evening.     SYSTANE BALANCE 0.6 % Soln  Generic drug:  Propylene Glycol  Place 1 drop into both eyes daily as needed (dry eyes).     telmisartan 80 MG tablet  Commonly known as:  MICARDIS  Take 1 tablet (80 mg total) by mouth daily.       No Known Allergies     Follow-up Information   Follow up with MURPHY, TIMOTHY, D, MD In 1 week.   Specialty:  Orthopedic Surgery   Contact information:   [address] [place] [address] [phone]       Follow up with Tivis Ringer, MD In 1 week.   Specialty:  Internal Medicine   Contact information:   [place] [address] [phone]        The results of significant diagnostics from this hospitalization (including imaging, microbiology, ancillary and laboratory) are listed below  for reference.    Significant Diagnostic Studies: Dg Elbow 2 Views Left  10/16/2014   CLINICAL DATA:  Fracture.  EXAM: LEFT ELBOW - 2 VIEW  COMPARISON:  None.  FINDINGS: The patient is in a splint, which decreases fine bony detail. There is fixation hardware across an olecranon process fracture. No adverse features. Small bone fragment at the anterior joint line of indeterminate chronicity. As permitted by overlapping artifact, no definite radial head abnormality. The distal humerus is intact.  IMPRESSION: Olecranon fracture status post ORIF.  No adverse findings.   Electronically Signed   By: Jorje Guild M.D.   On: 10/16/2014 03:10    Microbiology: Recent Results (from the past 240 hour(s))  SURGICAL PCR SCREEN     Status: None   Collection Time    10/15/14  3:09 PM      Result Value Ref Range Status   MRSA, PCR NEGATIVE  NEGATIVE Final   Staphylococcus aureus NEGATIVE  NEGATIVE Final   Comment:            The Xpert SA Assay (FDA     approved for NASAL specimens     in patients over 62 years of age),     is one component of     a comprehensive surveillance     program.  Test performance has     been validated by [employer] for patients greater     than or equal to 59 year old.     It is not intended     to diagnose infection nor to     guide or monitor treatment.     Labs: Basic Metabolic Panel:  Recent Labs Lab 10/14/14 2154 10/15/14 0429 10/16/14 0355  NA 139 140 135*  K 4.0 3.4* 3.8  CL 99 100 96  CO2 27 28 23   GLUCOSE 140* 123* 100*  BUN 20 17 18   CREATININE 0.67 0.64 0.63  CALCIUM 11.3* 9.9 9.1   Liver Function Tests: No results found for this basename: AST, ALT, ALKPHOS, BILITOT, PROT, ALBUMIN,  in the last 168 hours No results found for this basename: LIPASE, AMYLASE,  in the last 168 hours No results found for this basename: AMMONIA,  in the last 168 hours CBC:  Recent Labs Lab 10/14/14 2154 10/15/14 0429  WBC 9.7 7.4  NEUTROABS 7.0  --    HGB 13.3 11.8*  HCT 37.6 34.0*  MCV 96.4 96.3  PLT 171 144*   Cardiac Enzymes: No results found for this basename: CKTOTAL, CKMB, CKMBINDEX, TROPONINI,  in the last 168 hours BNP: BNP (last 3 results)  Recent Labs  03/25/14 0815 04/06/14 1458  PROBNP 10991.0* 80.0   CBG: No results found for this basename: GLUCAP,  in the last 168 hours     Signed:  Kinnie Feil  [facility] 10/18/2014, 3:25 PM

## 2014-10-18 NOTE — Progress Notes (Signed)
Physical Therapy Treatment Patient Details Name: Kimberly Jordan MRN: [id number] DOB: 07/20/1929 Today's Date: 10/18/2014    History of Present Illness s/p ORIF left elbow/olecranon fracture; h/o cardiomyopathy with ejection fraction 35% on her last echo this year, hypertension and right breast cancer     PT Comments    Making good progress with activity tolerance and amb; Recommend pt use a single point cane with amb; She reports she continues to have conversations with family and friends re: having more help at home;   Given progress, I'm leaning more towards discharge home with HHPT and OT follow-up   Follow Up Recommendations  Home health PT;Supervision - Intermittent     Equipment Recommendations  Cane    Recommendations for Other Services       Precautions / Restrictions Precautions Precaution Comments: Fall risk is lessening with more practice with walking Required Braces or Orthoses: Sling (LUE) Restrictions LUE Weight Bearing: Non weight bearing    Mobility  Bed Mobility Overal bed mobility: Modified Independent                Transfers Overall transfer level: Needs assistance Equipment used: None Transfers: Sit to/from Stand Sit to Stand: Min guard (without physical contact)         General transfer comment: More steady today, able to stand without as much dependence on RUE  Ambulation/Gait Ambulation/Gait assistance: Min guard Ambulation Distance (Feet): 120 Feet Assistive device: Quad cane (very small base ) Gait Pattern/deviations: Step-through pattern Gait velocity: slow and guarded   General Gait Details: Overall steadier than last two sessions; used cane well -- I believe she'll do better with a single point cane; no reports of dizziness   Stairs            Wheelchair Mobility    Modified Rankin (Stroke Patients Only)       Balance             Standing balance-Leahy Scale: Fair                       Cognition Arousal/Alertness: Awake/alert Behavior During Therapy: WFL for tasks assessed/performed Overall Cognitive Status: Within Functional Limits for tasks assessed                      Exercises      General Comments        Pertinent Vitals/Pain Pain Assessment: Faces Faces Pain Scale: Hurts little more Pain Location: L elbow Pain Descriptors / Indicators: Aching Pain Intervention(s): Monitored during session;Repositioned    Home Living                      Prior Function            PT Goals (current goals can now be found in the care plan section) Acute Rehab PT Goals Patient Stated Goal: REALLY wants to go home PT Goal Formulation: With patient Time For Goal Achievement: 10/23/14 Potential to Achieve Goals: Good Progress towards PT goals: Progressing toward goals    Frequency  Min 5X/week    PT Plan Current plan remains appropriate    Co-evaluation             End of Session Equipment Utilized During Treatment: Other (comment) (sling) Activity Tolerance: Patient tolerated treatment well Patient left: in chair;with call bell/phone within reach     Time: 1205-1230 PT Time Calculation (min): 25 min  Charges:  $Gait Training: 23-37  mins                    G Codes:      Roney Marion [facility] 10/18/2014, 2:58 PM  Roney Marion, [place] Pager [phone] Office [phone]

## 2014-10-19 ENCOUNTER — Encounter (HOSPITAL_COMMUNITY): Payer: Self-pay | Admitting: Orthopedic Surgery

## 2014-10-21 ENCOUNTER — Encounter (HOSPITAL_COMMUNITY): Payer: Self-pay | Admitting: Orthopedic Surgery

## 2014-10-29 ENCOUNTER — Encounter (HOSPITAL_COMMUNITY): Payer: Self-pay | Admitting: Orthopedic Surgery

## 2014-11-01 ENCOUNTER — Encounter (HOSPITAL_COMMUNITY): Payer: Self-pay | Admitting: Orthopedic Surgery

## 2014-11-05 ENCOUNTER — Other Ambulatory Visit: Payer: Self-pay | Admitting: Nurse Practitioner

## 2014-11-08 ENCOUNTER — Other Ambulatory Visit: Payer: Self-pay | Admitting: Internal Medicine

## 2014-11-08 DIAGNOSIS — R7989 Other specified abnormal findings of blood chemistry: Secondary | ICD-10-CM

## 2014-11-08 DIAGNOSIS — R079 Chest pain, unspecified: Secondary | ICD-10-CM

## 2014-11-09 ENCOUNTER — Ambulatory Visit
Admission: RE | Admit: 2014-11-09 | Discharge: 2014-11-09 | Disposition: A | Discharge status: Home / Self Care | Payer: Medicare PPO | Source: Ambulatory Visit | Attending: Internal Medicine | Admitting: Internal Medicine

## 2014-11-09 DIAGNOSIS — R7989 Other specified abnormal findings of blood chemistry: Secondary | ICD-10-CM

## 2014-11-09 DIAGNOSIS — R079 Chest pain, unspecified: Secondary | ICD-10-CM

## 2014-11-09 MED ORDER — IOHEXOL 350 MG/ML SOLN
100.0000 mL | Freq: Once | INTRAVENOUS | Status: AC | PRN
  Administered 2014-11-09: 100 mL via INTRAVENOUS

## 2014-11-11 ENCOUNTER — Other Ambulatory Visit: Payer: Self-pay | Admitting: Internal Medicine

## 2014-11-11 DIAGNOSIS — R918 Other nonspecific abnormal finding of lung field: Secondary | ICD-10-CM

## 2014-12-09 ENCOUNTER — Encounter (HOSPITAL_COMMUNITY): Payer: Self-pay | Admitting: Interventional Cardiology

## 2015-01-24 ENCOUNTER — Encounter: Payer: Self-pay | Admitting: Cardiology

## 2015-01-24 ENCOUNTER — Ambulatory Visit (INDEPENDENT_AMBULATORY_CARE_PROVIDER_SITE_OTHER): Payer: Medicare PPO | Admitting: Cardiology

## 2015-01-24 VITALS — BP 120/82 | HR 67 | Ht 60.0 in | Wt 105.0 lb

## 2015-01-24 DIAGNOSIS — I420 Dilated cardiomyopathy: Secondary | ICD-10-CM

## 2015-01-24 DIAGNOSIS — S42309K Unspecified fracture of shaft of humerus, unspecified arm, subsequent encounter for fracture with nonunion: Secondary | ICD-10-CM

## 2015-01-24 DIAGNOSIS — I429 Cardiomyopathy, unspecified: Secondary | ICD-10-CM

## 2015-01-24 DIAGNOSIS — I428 Other cardiomyopathies: Secondary | ICD-10-CM

## 2015-01-24 DIAGNOSIS — E440 Moderate protein-calorie malnutrition: Secondary | ICD-10-CM

## 2015-01-24 NOTE — Patient Instructions (Signed)
The current medical regimen is effective;  continue present plan and medications.  Follow up in 6 months with Dr. Skains.  You will receive a letter in the mail 2 months before you are due.  Please call us when you receive this letter to schedule your follow up appointment.  Thank you for choosing [facility]!!     

## 2015-01-24 NOTE — Progress Notes (Signed)
[employer]. [address], [address] [place], [place]  [address] Phone: [phone] Fax:  [phone]  Date:  01/24/2015   ID:  [name], DOB March 20, 1929, MRN [id number]  PCP:  Tivis Ringer, MD   History of Present Illness: Kimberly Jordan is a 81 y.o. female former 23 of [place]ar[place] with hospitalization in March of 2015 with small bowel obstruction and probable stress induced cardiomyopathy with ejection fraction of 20%. Typical morphology for this.   She was taken to the cardiac catheterization lab on an emergent basis because of fear of ST elevation myocardial infarction. She had no obstructive coronary disease.  She was hospitalized once again on 10/14/14 after mechanical fall, ulnar fracture. Her heart failure was well compensated. Her ejection fraction has mildly increased from 20% up to 35%.  No SOB, no CP. No orthopnea. Overall enjoying the things she enjoys doing.  Tolerating medications well.   Wt Readings from Last 3 Encounters:  01/24/15 105 lb (47.628 kg)  10/15/14 104 lb (47.174 kg)  09/15/14 104 lb 1.9 oz (47.229 kg)     Past Medical History  Diagnosis Date  . Breast cancer 1992    right breast   . Hypertension   . Blepharitis   . Osteoporosis   . Ectopic pregnancy   . Colon polyp     Past Surgical History  Procedure Laterality Date  . Ectopic pregnancy surgery      salpingectomy  . Colonoscopy w/ polypectomy    . Hand surgery  2007    right hand-ruptured tendon  . Cataract extraction  2010, 2011    2010 right eye, 2011 left eye  . Cyst removed  2012    from right wrist  . Total abdominal hysterectomy w/ bilateral salpingoophorectomy  1994  . Knee arthroscopy  1998  . Laparotomy N/A 03/22/2014    Procedure: EXPLORATORY LAPAROTOMY;  Surgeon: Merrie Roof, MD;  Location: [facility];  Service: General;  Laterality: N/A;  . Orif elbow fracture Left 10/15/2014    Procedure: OPEN REDUCTION INTERNAL FIXATION (ORIF) ELBOW/OLECRANON FRACTURE;   Surgeon: Renette Butters, MD;  Location: [facility];  Service: Orthopedics;  Laterality: Left;  stryker elbow plate   . Left heart catheterization with coronary angiogram N/A 03/25/2014    Procedure: LEFT HEART CATHETERIZATION WITH CORONARY ANGIOGRAM;  Surgeon: Jettie Booze, MD;  Location: [facility] C[facility] LAB;  Service: Cardiovascular;  Laterality: N/A;    Current Outpatient Prescriptions  Medication Sig Dispense Refill  . aspirin 81 MG tablet Take 81 mg by mouth daily.    . Calcium Carbonate-Vitamin D (CALCIUM + D PO) Take 600 mg by mouth 3 (three) times daily.    . carvedilol (COREG) 12.5 MG tablet Take 1 tablet (12.5 mg total) by mouth 2 (two) times daily with a meal. 60 tablet 6  . clobetasol cream (TEMOVATE) 6.96 % Apply 1 application topically as needed (For itching on back).     . docusate sodium (COLACE) 100 MG capsule Take 1 capsule (100 mg total) by mouth 2 (two) times daily. 60 capsule 0  . doxycycline (VIBRA-TABS) 100 MG tablet Take 100 mg by mouth every evening.     Marland Kitchen FORTEO 600 MCG/2.4ML SOLN Inject 20 mcg into the skin daily.     Marland Kitchen HYDROcodone-acetaminophen (NORCO) 5-325 MG per tablet Take 1-2 tablets by mouth every 4 (four) hours as needed for moderate pain. 90 tablet 0  . OVER THE COUNTER MEDICATION Place 1 application  into both eyes at bedtime. occu scrub    . Polyvinyl Alcohol-Povidone (CLEAR EYES NATURAL TEARS) 5-6 MG/ML SOLN Place 2 drops into both eyes daily as needed (dry eyes).     . potassium chloride (K-DUR) 10 MEQ tablet Take 10 mEq by mouth 2 (two) times daily.     Marland Kitchen Propylene Glycol (SYSTANE BALANCE) 0.6 % SOLN Place 1 drop into both eyes daily as needed (dry eyes).     . simvastatin (ZOCOR) 20 MG tablet Take 20 mg by mouth every evening.     Marland Kitchen telmisartan (MICARDIS) 80 MG tablet TAKE 1 TABLET ONCE DAILY. 30 tablet 6   No current facility-administered medications for this visit.    Allergies:   No Known Allergies  Social History:  The patient  reports that she has  quit smoking. She has never used smokeless tobacco. She reports that she drinks about 3.0 oz of alcohol per week. She reports that she does not use illicit drugs.   Family History  Problem Relation Age of Onset  . Osteoporosis Mother   . CVA Father   . Hypertension Father   . Arthritis Mother   . Hypertension Mother     ROS:  Please see the history of present illness.  Denies any syncope, dizziness. Positive mild ankle edema in the evenings. No orthopnea, no PND   All other systems reviewed and negative.   PHYSICAL EXAM: VS:  BP 120/82 mmHg  Pulse 67  Ht 5' (1.524 m)  Wt 105 lb (47.628 kg)  BMI 20.51 kg/m2  LMP 12/31/1992 Thin, in no acute distress HEENT: normal, [place]/AT, EOMI, head bandage, derm Neck: no JVD, normal carotid upstroke, no bruit Cardiac:  normal S1, S2; RRR; 2/6 systolic murmur LLSB mild radiation to carotids Lungs:  clear to auscultation bilaterally, no wheezing, rhonchi or rales Abd: soft, nontender, no hepatomegaly, no bruits Ext: no edema, 2+ distal pulses Skin: warm and dry, Ecchymoses noted GU: deferred Neuro: no focal abnormalities noted, AAO x 3  EKG:  None today  Echocardiogram: 07/06/14-ejection fraction 35% up from 25%.  ASSESSMENT AND PLAN:  1. Nonischemic cardiomyopathy - cardiac catheterization reassuring. NYHA 1-2.  Uptitrated carvedilol to 12.5 twice a day. Continue with angiotensin receptor blocker. No indication of fluid overload. Given her advanced age, we will not refer for ICD. We did discuss the possibility of dangerous cardiac arrhythmias. 2. Hypertension-currently well controlled on current medication strategy. 3. Protein malnutrition-it is likely that her albumin is slightly low causing some of the ankle edema. Her weight is decreasing. Continue to encourage adequate diet. History of small bowel obstruction. 4. 6 month followup   Signed, Candee Furbish, MD [facility]  01/24/2015 2:52 PM

## 2015-02-11 ENCOUNTER — Ambulatory Visit
Admission: RE | Admit: 2015-02-11 | Discharge: 2015-02-11 | Disposition: A | Discharge status: Home / Self Care | Payer: Medicare PPO | Source: Ambulatory Visit | Attending: Internal Medicine | Admitting: Internal Medicine

## 2015-02-11 DIAGNOSIS — R918 Other nonspecific abnormal finding of lung field: Secondary | ICD-10-CM

## 2015-02-11 MED ORDER — IOHEXOL 300 MG/ML  SOLN
75.0000 mL | Freq: Once | INTRAMUSCULAR | Status: AC | PRN
  Administered 2015-02-11: 75 mL via INTRAVENOUS

## 2015-02-18 ENCOUNTER — Ambulatory Visit (INDEPENDENT_AMBULATORY_CARE_PROVIDER_SITE_OTHER): Payer: Medicare HMO | Admitting: Obstetrics & Gynecology

## 2015-02-18 ENCOUNTER — Encounter: Payer: Self-pay | Admitting: Obstetrics & Gynecology

## 2015-02-18 VITALS — BP 144/78 | HR 70 | Ht 61.0 in | Wt 103.2 lb

## 2015-02-18 DIAGNOSIS — N63 Unspecified lump in breast: Secondary | ICD-10-CM

## 2015-02-18 DIAGNOSIS — Z01419 Encounter for gynecological examination (general) (routine) without abnormal findings: Secondary | ICD-10-CM

## 2015-02-18 DIAGNOSIS — N6310 Unspecified lump in the right breast, unspecified quadrant: Secondary | ICD-10-CM

## 2015-02-18 NOTE — Progress Notes (Signed)
Patient is scheduled for Bilateral Breast Diagnostic Mammogram and R Breast Ultrasound at [facility] imaging on 02/23/15 at 1345 . Patient agreeable to time/date/location.

## 2015-02-18 NOTE — Progress Notes (Signed)
81 y.o. [id number] Married CaucasianF here for annual exam.  Reports she is doing well "for my age and my condition".  No vaginal bleeding.  Had a SBO due to adhesions.  Did have to have surgery for this.  Also fell by tripping off a step and broke her elbow.  Had possible stress induced cardiomyopathy with EF 20%.  Repeat in July was mildly improved 7/15.    Did have CT in November to rule out PE.  Lung nodules were noted.  Repeat CT last week was stable without change.   PCP:  Dr. Dagmar Hait.  Pt seeing him about every six months.     Patient's last menstrual period was 12/31/1992.          Sexually active: No.  The current method of family planning is TAH/BSO   Exercising: Yes.    Walking 2x/wk Smoker:  no  Health Maintenance: Pap:  04/28/2007 Neg History of abnormal Pap:  no MMG: 10/14 neg. Colonoscopy: 2005 normal f/u needed  in 2010 BMD:   2014/2015 TDaP:  Within 10 years, not sure  Screening Labs: no, Done by Prince Solian, MD   reports that she has quit smoking. She has never used smokeless tobacco. She reports that she drinks about 3.0 oz of alcohol per week. She reports that she does not use illicit drugs.  Past Medical History  Diagnosis Date  . Breast cancer 1992    right breast   . Hypertension   . Blepharitis   . Osteoporosis   . Ectopic pregnancy   . Colon polyp     Past Surgical History  Procedure Laterality Date  . Ectopic pregnancy surgery      salpingectomy  . Colonoscopy w/ polypectomy    . Hand surgery  2007    right hand-ruptured tendon  . Cataract extraction  2010, 2011    2010 right eye, 2011 left eye  . Cyst removed  2012    from right wrist  . Total abdominal hysterectomy w/ bilateral salpingoophorectomy  1994  . Knee arthroscopy  1998  . Laparotomy N/A 03/22/2014    Procedure: EXPLORATORY LAPAROTOMY;  Surgeon: Merrie Roof, MD;  [facility];  Service: General;  Laterality: N/A;  . Orif elbow fracture Left 10/15/2014    Procedure: OPEN  REDUCTION INTERNAL FIXATION (ORIF) ELBOW/OLECRANON FRACTURE;  Surgeon: Renette Butters, MD;  Location: [facility];  Service: Orthopedics;  Laterality: Left;  stryker elbow plate   . Left heart catheterization with coronary angiogram N/A 03/25/2014    Procedure: LEFT HEART CATHETERIZATION WITH CORONARY ANGIOGRAM;  Surgeon: Jettie Booze, MD;  Location: [facility] CATH LAB;  Service: Cardiovascular;  Laterality: N/A;    Current Outpatient Prescriptions  Medication Sig Dispense Refill  . aspirin 81 MG tablet Take 81 mg by mouth daily.    . Calcium Carbonate-Vitamin D (CALCIUM + D PO) Take 600 mg by mouth 3 (three) times daily.    . carvedilol (COREG) 12.5 MG tablet Take 1 tablet (12.5 mg total) by mouth 2 (two) times daily with a meal. 60 tablet 6  . clobetasol cream (TEMOVATE) 4.19 % Apply 1 application topically as needed (For itching on back).     . docusate sodium (COLACE) 100 MG capsule Take 1 capsule (100 mg total) by mouth 2 (two) times daily. 60 capsule 0  . doxycycline (VIBRA-TABS) 100 MG tablet Take 100 mg by mouth every evening.     Marland Kitchen FORTEO 600 MCG/2.4ML SOLN Inject 20 mcg into  the skin daily.     Marland Kitchen HYDROcodone-acetaminophen (NORCO) 5-325 MG per tablet Take 1-2 tablets by mouth every 4 (four) hours as needed for moderate pain. 90 tablet 0  . OVER THE COUNTER MEDICATION Place 1 application into both eyes at bedtime. occu scrub    . Polyvinyl Alcohol-Povidone (CLEAR EYES NATURAL TEARS) 5-6 MG/ML SOLN Place 2 drops into both eyes daily as needed (dry eyes).     . potassium chloride (K-DUR) 10 MEQ tablet Take 10 mEq by mouth 2 (two) times daily.     Marland Kitchen Propylene Glycol (SYSTANE BALANCE) 0.6 % SOLN Place 1 drop into both eyes daily as needed (dry eyes).     . simvastatin (ZOCOR) 20 MG tablet Take 20 mg by mouth every evening.     Marland Kitchen telmisartan (MICARDIS) 80 MG tablet TAKE 1 TABLET ONCE DAILY. 30 tablet 6   No current facility-administered medications for this visit.    Family History  Problem  Relation Age of Onset  . Osteoporosis Mother   . CVA Father   . Hypertension Father   . Arthritis Mother   . Hypertension Mother     ROS:  Pertinent items are noted in HPI.  Otherwise, a comprehensive ROS was negative.  Exam:   General appearance: alert, cooperative and appears stated age Head: Normocephalic, without obvious abnormality, atraumatic Neck: no adenopathy, supple, symmetrical, trachea midline and thyroid normal to inspection and palpation Breasts: on right firm, hard area about 4cm, irregular along scar from prior lumpectomy, no LAD, left without masses, no AD Abdomen: soft, non-tender; bowel sounds normal; no masses,  no organomegaly Extremities: extremities normal, atraumatic, no cyanosis or edema Skin: Skin color, texture, turgor normal. No rashes or lesions Lymph nodes: Cervical, supraclavicular, and axillary nodes normal. No abnormal inguinal nodes palpated Neurologic: Grossly normal   Pelvic: External genitalia:  no lesions              Urethra:  normal appearing urethra with no masses, tenderness or lesions              Bartholins and Skenes: normal                 Vagina: normal appearing vagina with normal color and discharge, no lesions              Cervix: absent              Pap taken: No. Bimanual Exam:  Uterus:  uterus absent              Adnexa: no mass, fullness, tenderness               Rectovaginal: Confirms               Anus:  normal sphincter tone, no lesions  Chaperone was present for exam.  A:  Well Woman with normal exam PMP, no HRT H/O breast cancer 1992 H/O TAH/BSO Breast mass along right breast scar  P: Diagnostic MMG will be scheduled.  Labs and vaccines with Dr. Dagmar Hait return annually or prn

## 2015-03-04 ENCOUNTER — Other Ambulatory Visit: Payer: Self-pay | Admitting: Cardiology

## 2015-04-19 IMAGING — CT CT ABD-PELV W/ CM
1 of 3 series · 12 of 32 positions shown, 17 images · IV contrast (OMNIPAQUE 300)
Comparison: None.

CLINICAL DATA: Lower abdominal pain, worse within the right lower
abdominal quadrant, history of breast cancer, post lumpectomy and
radiation therapy

EXAM:
CT ABDOMEN AND PELVIS WITH CONTRAST
TECHNIQUE: Multidetector CT imaging of the abdomen and pelvis was performed
using the standard protocol following bolus administration of
intravenous contrast.
CONTRAST:  100mL OMNIPAQUE IOHEXOL 300 MG/ML  SOLN

[Series 2: abd/pel with · axial · 0.65mm/px · z∈[-406,-81]mm · 12 of 75 slices shown, 17 images]
[im 5/75  soft-tissue]
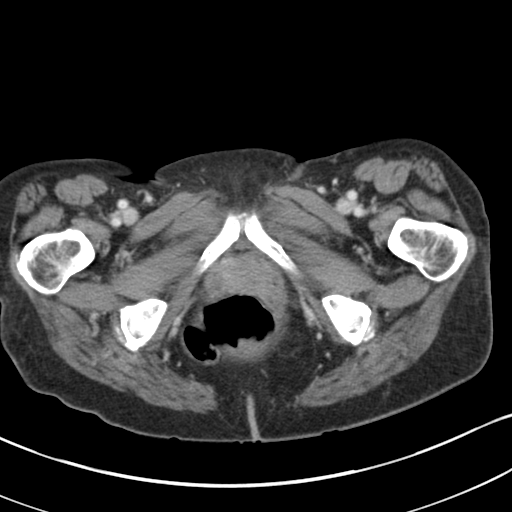
[im 5/75  bone]
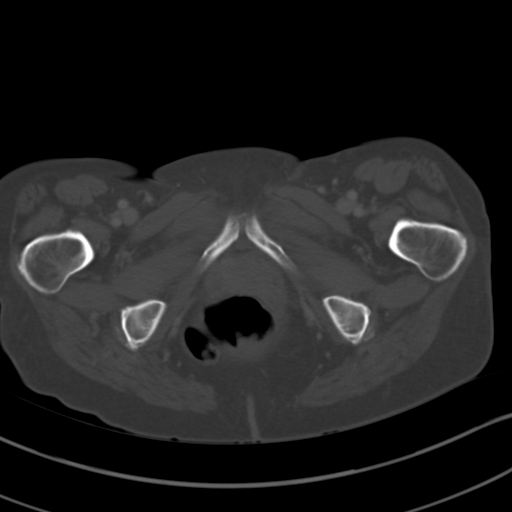
[im 14/75  soft-tissue]
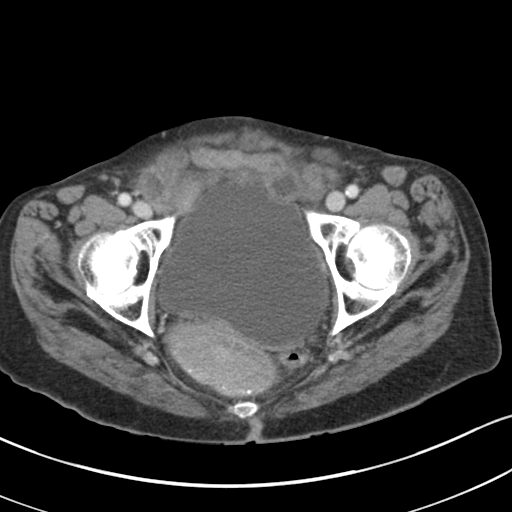
[im 18/75  soft-tissue]
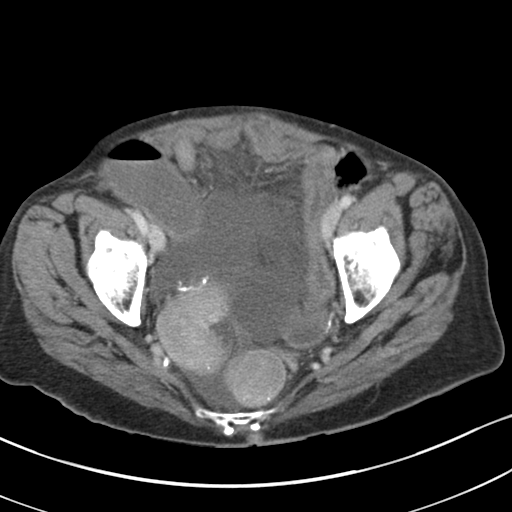
[im 27/75  soft-tissue]
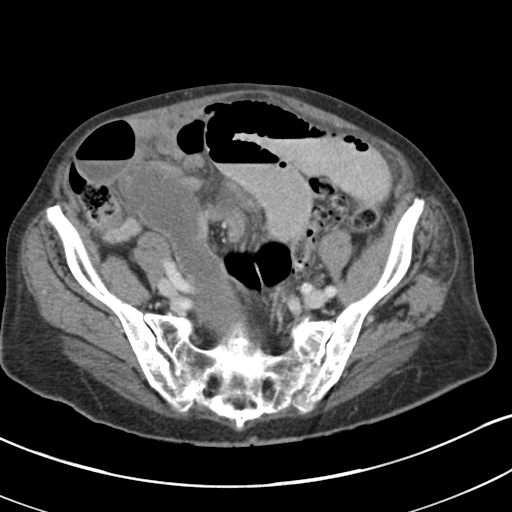
[im 31/75  soft-tissue]
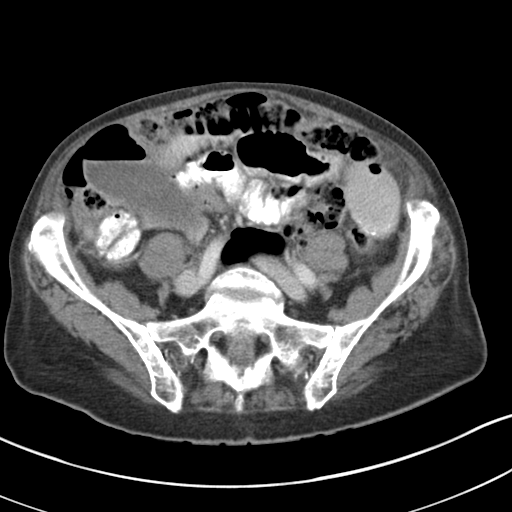
[im 40/75  soft-tissue]
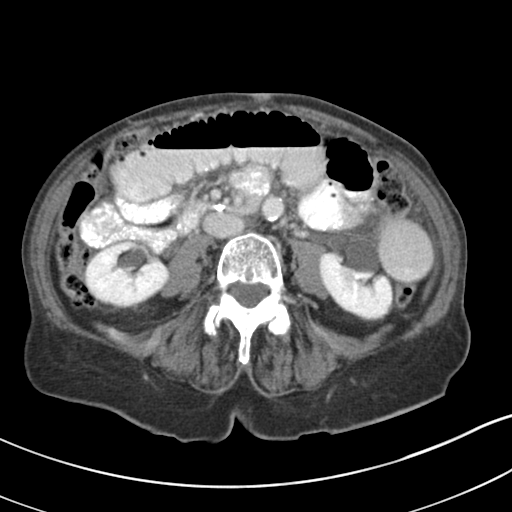
[im 44/75  soft-tissue]
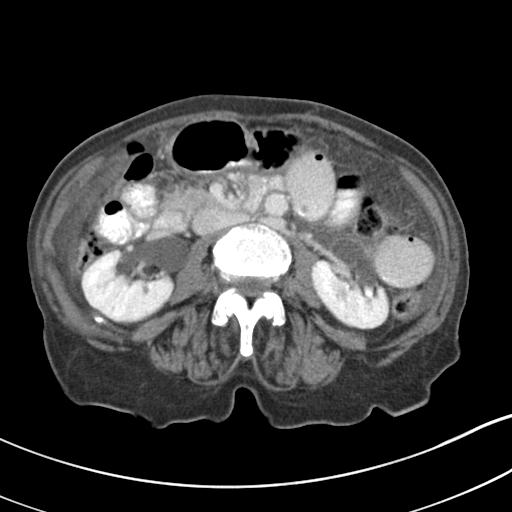
[im 48/75  soft-tissue]
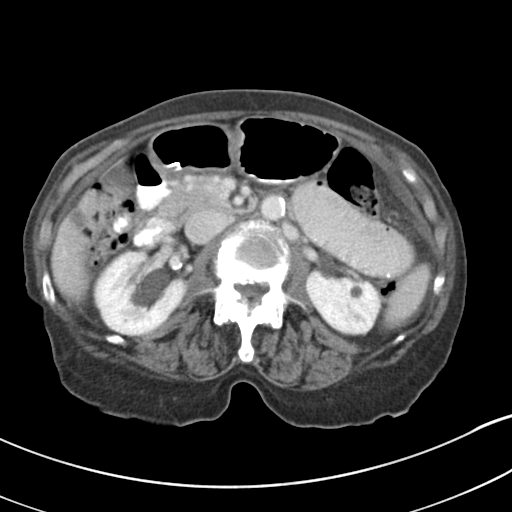
[im 57/75  soft-tissue]
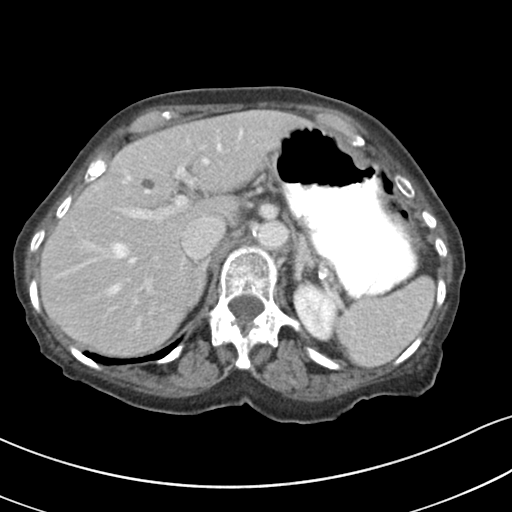
[im 57/75  lung]
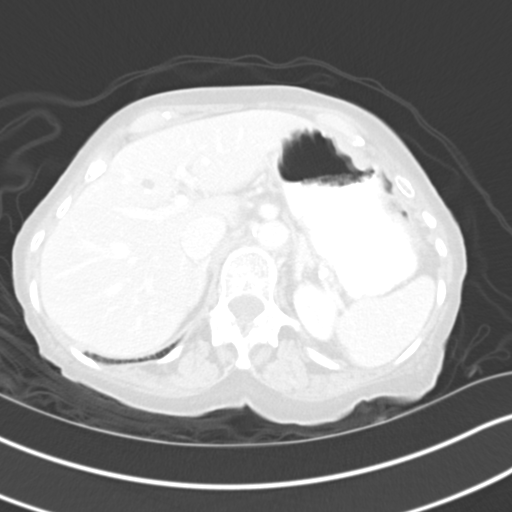
[im 57/75  bone]
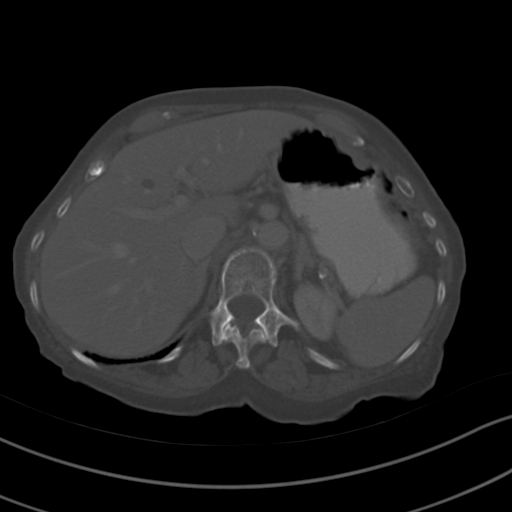
[im 61/75  soft-tissue]
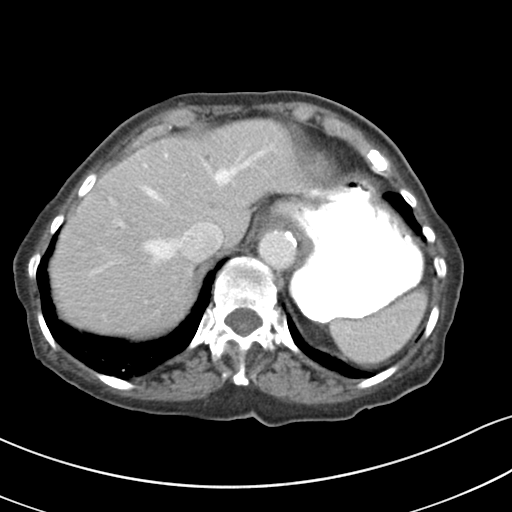
[im 61/75  lung]
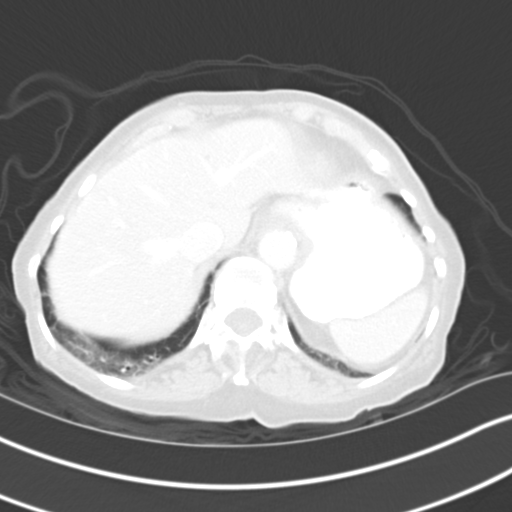
[im 66/75  lung]
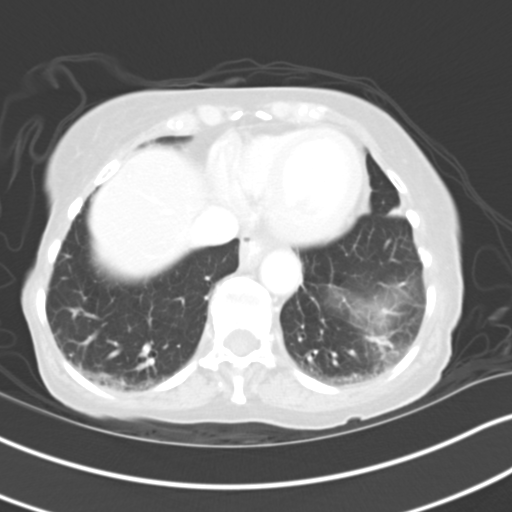
[im 70/75  soft-tissue]
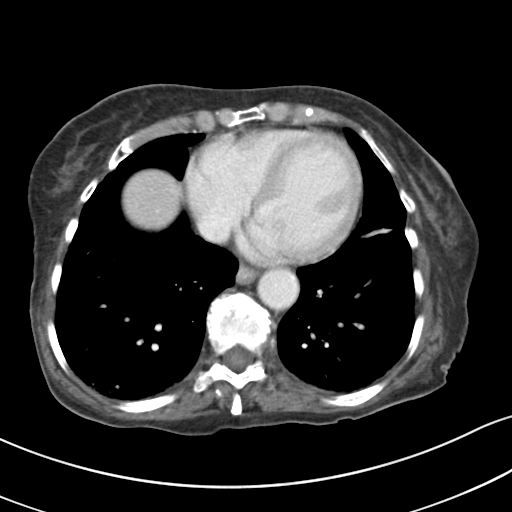
[im 70/75  lung]
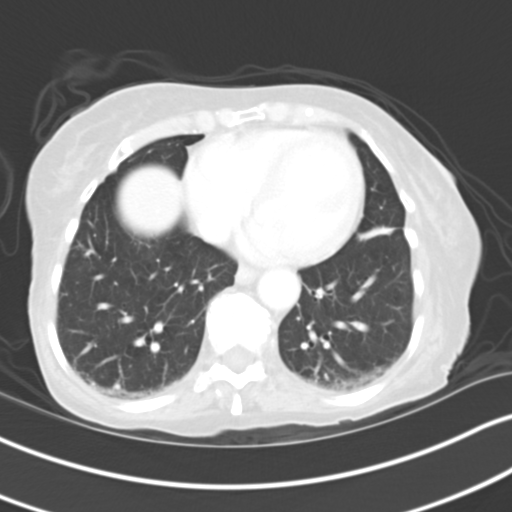

[12 of 32 positions shown; findings below may reference images not displayed]

FINDINGS: There is marked distension of the upstream small bowel with
transition point located within the right lower abdominal quadrant
(axial image 53, series 2, coronal image 42, series 5). This finding
is associated with decompression of the downstream small bowel as
well as the colon. This finding is associated with minimal amount of
free fluid within the pelvic cul-de-sac but without
definable/drainable fluid collection. No definite evidence
perforation. No pneumoperitoneum or pneumatosis. The appendix is not
visualized, however there is no inflammatory change within the right
lower abdominal quadrant.

Normal hepatic contour. Scattered sub cm hypo attenuating renal
lesions too small to actually characterize are favored to represent
axis. Normal appearance of the gallbladder given a distention. No
definite radiopaque gallstones. No intra extra pedicular duct
dilatation. No ascites.

There is symmetric enhancement and excretion of the bilateral
kidneys. Note is made of bilateral extrarenal pelvises. No definite
evidence of urinary obstruction. The definite renal stones on this
postcontrast examination. Bilateral subcentimeter hypoattenuating
renal lesions are too small to adequately characterize are favored
to represent renal cysts. Normal appearance of the bilateral adrenal
glands, pancreas and spleen.

Scattered atherosclerotic plaque within a normal caliber abdominal
aorta. The major branch vessels of the abdominal aorta appear patent
on this non CTA examination. Distal note is made of a common origin
of the celiac and SMA day. This still note is made of a retro aortic
left-sided renal vein. No definite bulky retroperitoneal,
mesenteric, pelvic or inguinal lymphadenopathy.

Post hysterectomy.  No discrete adnexal lesion.

Limited visualization of the lower thorax demonstrates minimal
dependent ground-glass atelectasis. There is minimal subsegmental
atelectasis about the left major fissure. No pleural effusion.
Normal heart size. Minimal coronary artery calcifications. No
pericardial effusion.

Age-indeterminate moderate (approximately 50%) compression deformity
involving the superior endplate of the L3 vertebral body with
approximately 4 mm of retropulsion of the superior endplate towards
the spinal canal. There is minimal (approximately 4 mm) of
anterolisthesis of L4 upon L5 without associated pars defect.

Calcifications are noted within the right breast (image 2).
IMPRESSION: 1. High-grade small bowel obstruction with transition point located
within the right lower abdominal quadrant. The etiology of the
obstruction is not depicted on this examination and thus is
presumably secondary to adhesions. No evidence of perforation or
drainable fluid collection.
2. Age-indeterminate moderate (approximately 50%) compression
deformity involving the superior endplate of the L3 vertebral body.
Correlation point tenderness at this location is recommended.

## 2015-04-19 IMAGING — CR DG CHEST 2V
2 series · 2 of 2 positions shown · non-contrast
Comparison: 12/20/2006

CLINICAL DATA: Cough

EXAM:
CHEST  2 VIEW

[w chest lat]
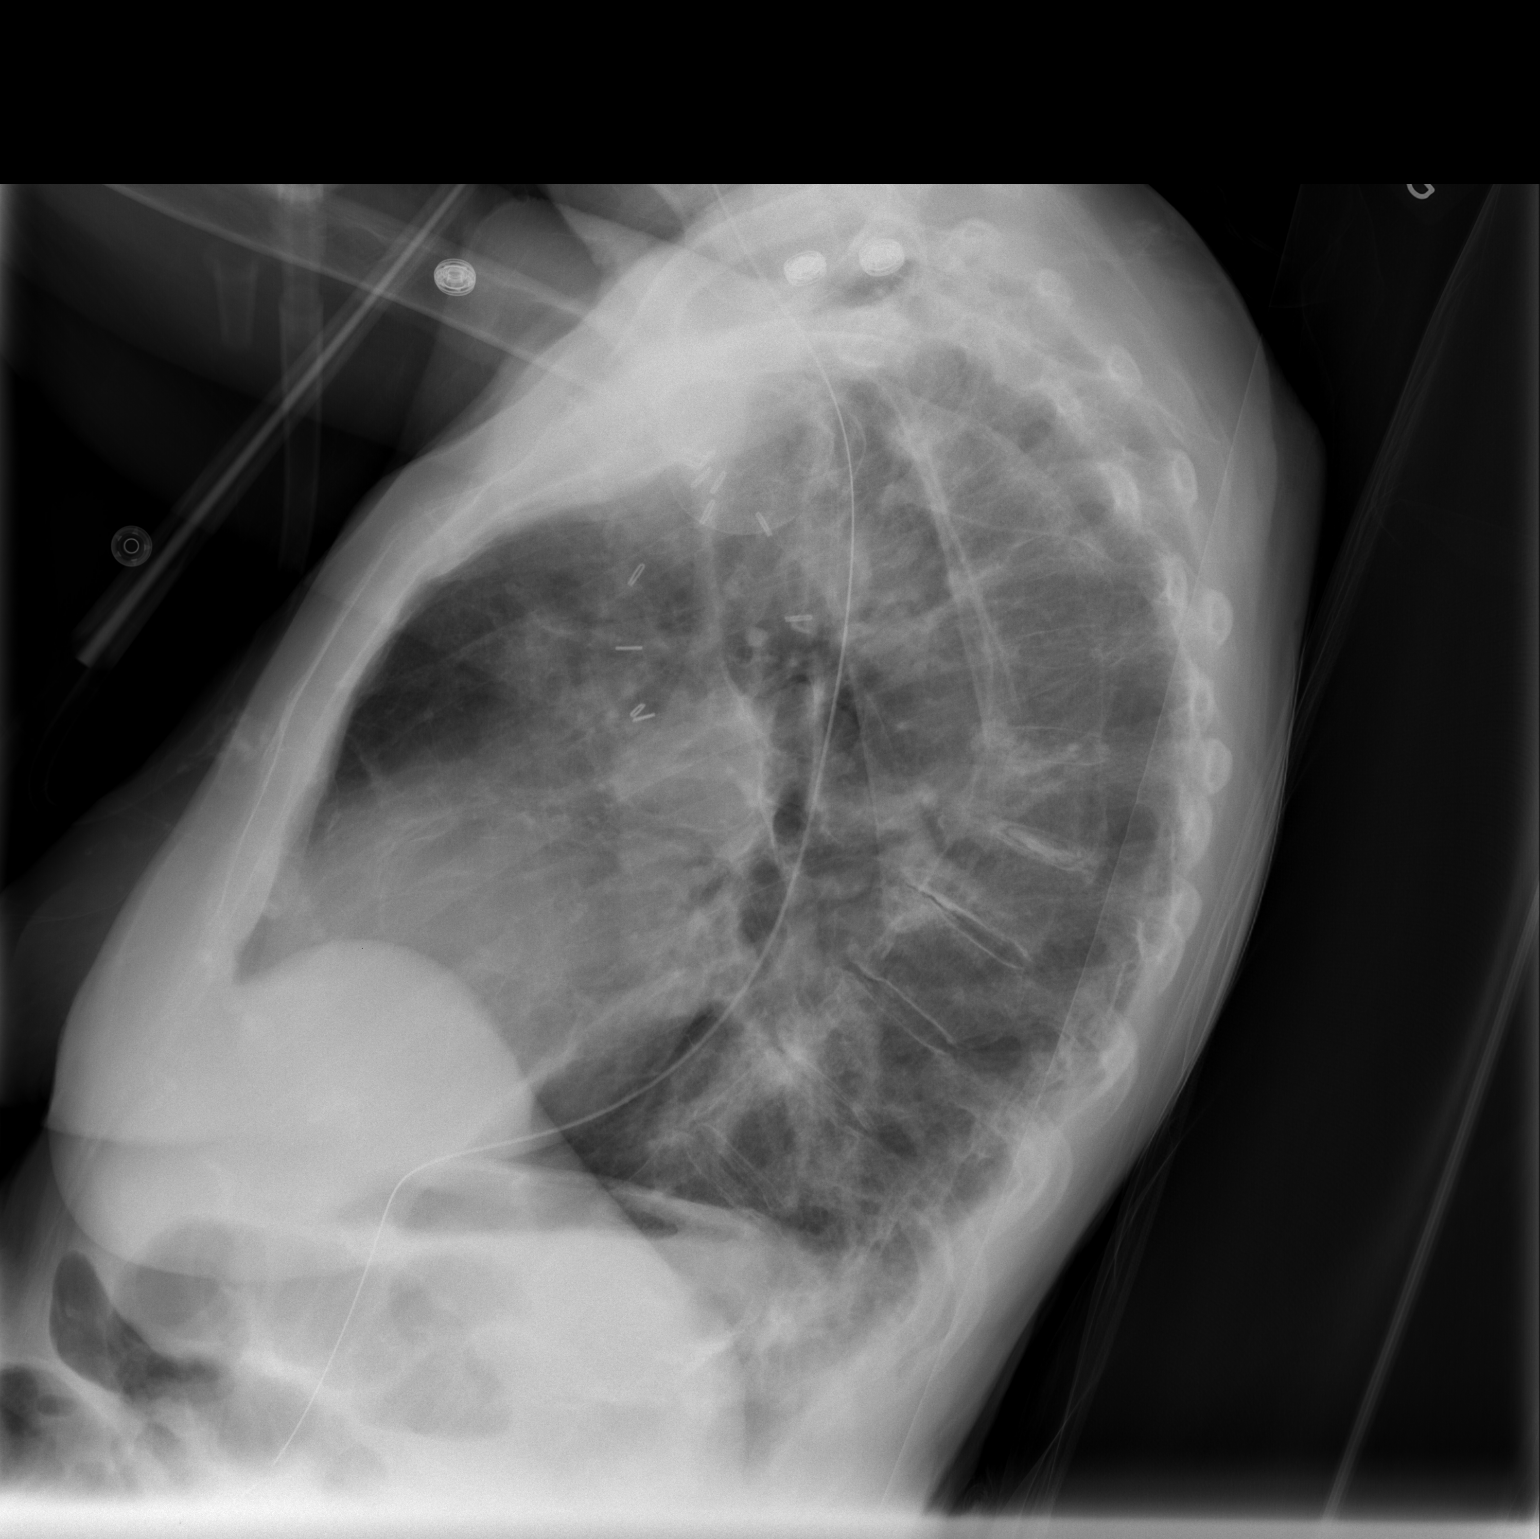

[view not recorded]
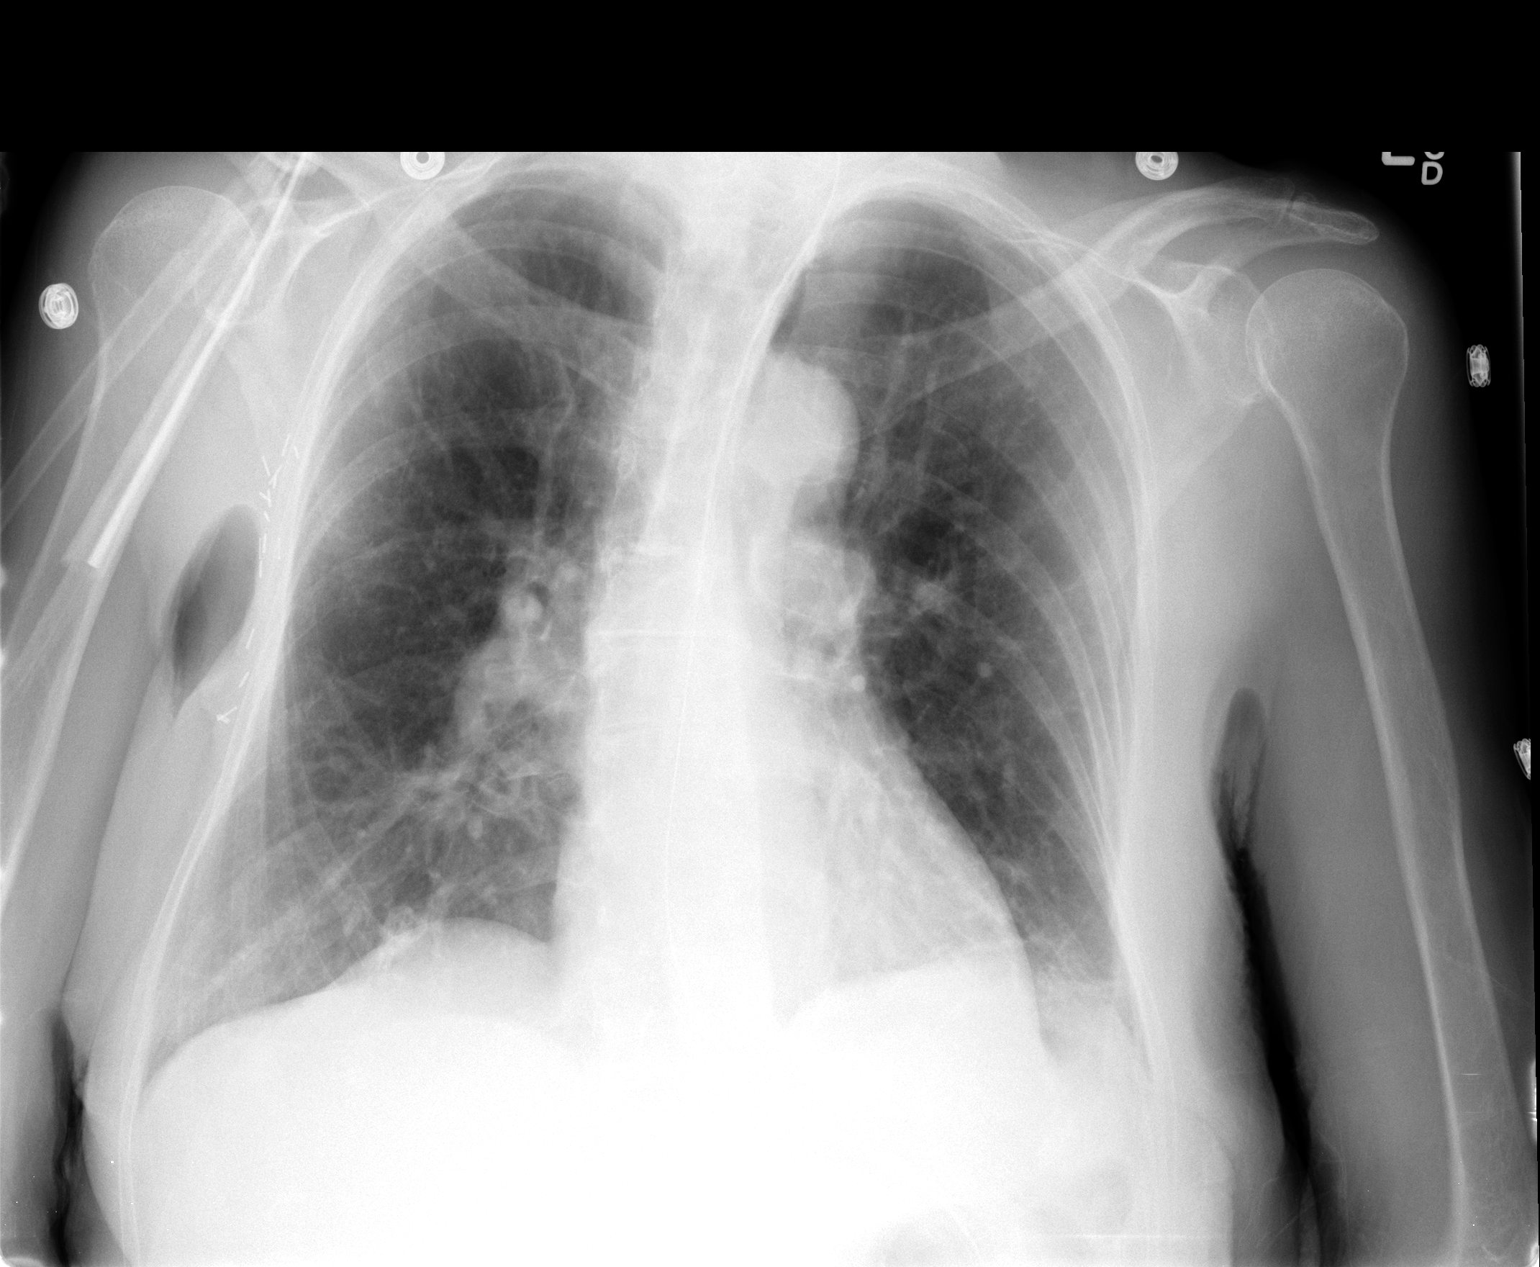

[2 of 2 positions shown; findings below may reference images not displayed]

FINDINGS: Cardiac shadow is stable. The lungs are well aerated bilaterally.
Minimal bibasilar atelectasis is seen slightly worse on the left
than the right. No focal confluent infiltrate is noted. Postsurgical
changes are again seen on the right. A nasogastric catheter is noted
within the stomach.
IMPRESSION: Mild bibasilar atelectasis left greater than right.

## 2015-05-26 DIAGNOSIS — Z6820 Body mass index (BMI) 20.0-20.9, adult: Secondary | ICD-10-CM | POA: Diagnosis not present

## 2015-05-26 DIAGNOSIS — E785 Hyperlipidemia, unspecified: Secondary | ICD-10-CM | POA: Diagnosis not present

## 2015-05-26 DIAGNOSIS — M810 Age-related osteoporosis without current pathological fracture: Secondary | ICD-10-CM | POA: Diagnosis not present

## 2015-05-26 DIAGNOSIS — I10 Essential (primary) hypertension: Secondary | ICD-10-CM | POA: Diagnosis not present

## 2015-06-08 ENCOUNTER — Other Ambulatory Visit: Payer: Self-pay | Admitting: Nurse Practitioner

## 2015-06-13 DIAGNOSIS — Z961 Presence of intraocular lens: Secondary | ICD-10-CM | POA: Diagnosis not present

## 2015-06-13 DIAGNOSIS — H01001 Unspecified blepharitis right upper eyelid: Secondary | ICD-10-CM | POA: Diagnosis not present

## 2015-06-13 DIAGNOSIS — H01004 Unspecified blepharitis left upper eyelid: Secondary | ICD-10-CM | POA: Diagnosis not present

## 2015-06-13 DIAGNOSIS — H26493 Other secondary cataract, bilateral: Secondary | ICD-10-CM | POA: Diagnosis not present

## 2015-06-16 DIAGNOSIS — I10 Essential (primary) hypertension: Secondary | ICD-10-CM | POA: Diagnosis not present

## 2015-06-16 DIAGNOSIS — Z0000 Encounter for general adult medical examination without abnormal findings: Secondary | ICD-10-CM | POA: Diagnosis not present

## 2015-06-16 DIAGNOSIS — M859 Disorder of bone density and structure, unspecified: Secondary | ICD-10-CM | POA: Diagnosis not present

## 2015-06-16 DIAGNOSIS — R7301 Impaired fasting glucose: Secondary | ICD-10-CM | POA: Diagnosis not present

## 2015-06-16 DIAGNOSIS — E785 Hyperlipidemia, unspecified: Secondary | ICD-10-CM | POA: Diagnosis not present

## 2015-06-23 DIAGNOSIS — R918 Other nonspecific abnormal finding of lung field: Secondary | ICD-10-CM | POA: Diagnosis not present

## 2015-06-23 DIAGNOSIS — I429 Cardiomyopathy, unspecified: Secondary | ICD-10-CM | POA: Diagnosis not present

## 2015-06-23 DIAGNOSIS — K5660 Unspecified intestinal obstruction: Secondary | ICD-10-CM | POA: Diagnosis not present

## 2015-06-23 DIAGNOSIS — N3281 Overactive bladder: Secondary | ICD-10-CM | POA: Diagnosis not present

## 2015-06-23 DIAGNOSIS — M1990 Unspecified osteoarthritis, unspecified site: Secondary | ICD-10-CM | POA: Diagnosis not present

## 2015-06-23 DIAGNOSIS — Z9889 Other specified postprocedural states: Secondary | ICD-10-CM | POA: Diagnosis not present

## 2015-06-23 DIAGNOSIS — C50919 Malignant neoplasm of unspecified site of unspecified female breast: Secondary | ICD-10-CM | POA: Diagnosis not present

## 2015-06-23 DIAGNOSIS — Z1389 Encounter for screening for other disorder: Secondary | ICD-10-CM | POA: Diagnosis not present

## 2015-06-23 DIAGNOSIS — N390 Urinary tract infection, site not specified: Secondary | ICD-10-CM | POA: Diagnosis not present

## 2015-06-23 DIAGNOSIS — Z0000 Encounter for general adult medical examination without abnormal findings: Secondary | ICD-10-CM | POA: Diagnosis not present

## 2015-06-28 ENCOUNTER — Other Ambulatory Visit: Payer: Self-pay | Admitting: Internal Medicine

## 2015-06-28 DIAGNOSIS — R911 Solitary pulmonary nodule: Secondary | ICD-10-CM

## 2015-07-05 ENCOUNTER — Other Ambulatory Visit: Payer: Self-pay | Admitting: Cardiology

## 2015-07-08 ENCOUNTER — Other Ambulatory Visit: Payer: Self-pay | Admitting: Cardiology

## 2015-07-08 ENCOUNTER — Other Ambulatory Visit: Payer: Self-pay

## 2015-07-08 ENCOUNTER — Inpatient Hospital Stay: Admission: RE | Admit: 2015-07-08 | Payer: Medicare PPO | Source: Ambulatory Visit

## 2015-07-08 MED ORDER — TELMISARTAN 80 MG PO TABS: 80.0000 mg | ORAL_TABLET | Freq: Every day | ORAL | Status: DC

## 2015-07-19 DIAGNOSIS — Z1212 Encounter for screening for malignant neoplasm of rectum: Secondary | ICD-10-CM | POA: Diagnosis not present

## 2015-07-22 DIAGNOSIS — L821 Other seborrheic keratosis: Secondary | ICD-10-CM | POA: Diagnosis not present

## 2015-07-22 DIAGNOSIS — D2271 Melanocytic nevi of right lower limb, including hip: Secondary | ICD-10-CM | POA: Diagnosis not present

## 2015-07-22 DIAGNOSIS — Z85828 Personal history of other malignant neoplasm of skin: Secondary | ICD-10-CM | POA: Diagnosis not present

## 2015-07-22 DIAGNOSIS — L570 Actinic keratosis: Secondary | ICD-10-CM | POA: Diagnosis not present

## 2015-08-18 ENCOUNTER — Ambulatory Visit: Payer: Medicare PPO | Admitting: Cardiology

## 2015-09-02 ENCOUNTER — Ambulatory Visit (INDEPENDENT_AMBULATORY_CARE_PROVIDER_SITE_OTHER): Payer: Medicare PPO | Admitting: Cardiology

## 2015-09-02 ENCOUNTER — Encounter: Payer: Self-pay | Admitting: Cardiology

## 2015-09-02 VITALS — BP 126/66 | HR 69 | Ht 60.0 in | Wt 102.2 lb

## 2015-09-02 DIAGNOSIS — I429 Cardiomyopathy, unspecified: Secondary | ICD-10-CM

## 2015-09-02 DIAGNOSIS — E43 Unspecified severe protein-calorie malnutrition: Secondary | ICD-10-CM

## 2015-09-02 DIAGNOSIS — I428 Other cardiomyopathies: Secondary | ICD-10-CM

## 2015-09-02 DIAGNOSIS — I10 Essential (primary) hypertension: Secondary | ICD-10-CM | POA: Diagnosis not present

## 2015-09-02 NOTE — Patient Instructions (Signed)
Medication Instructions:  Your physician recommends that you continue on your current medications as directed. Please refer to the Current Medication list given to you today.  Follow-Up: Follow up in 6 months with Dr. Skains.  You will receive a letter in the mail 2 months before you are due.  Please call us when you receive this letter to schedule your follow up appointment.  Thank you for choosing [facility]!!     

## 2015-09-02 NOTE — Progress Notes (Signed)
[facility]. [address] [place], [place]  [address] Phone: [phone] Fax:  [phone]  Date:  09/02/2015   ID:  Kimberly Jordan, DOB 27-Aug-1929, MRN [id number]  PCP:  Tivis Ringer, MD   History of Present Illness: Kimberly Jordan is a 81 y.o. female former 32 of [place] with hospitalization in March of 2015 with small bowel obstruction and probable stress induced cardiomyopathy with ejection fraction of 20%. Typical morphology for this.   She was taken to the cardiac catheterization lab on an emergent basis because of fear of ST elevation myocardial infarction. She had no obstructive coronary disease.  She was hospitalized once again on 10/14/14 after mechanical fall, ulnar fracture. Her heart failure was well compensated. Her ejection fraction has mildly increased from 20% up to 35%.  No SOB, no CP. No orthopnea. Overall enjoying the things she enjoys doing.  Tolerating medications well.  Husband fell and broke arm. Overall she is doing well. Changes.   Wt Readings from Last 3 Encounters:  09/02/15 102 lb 4 oz (46.38 kg)  02/18/15 103 lb 3.2 oz (46.811 kg)  01/24/15 105 lb (47.628 kg)     Past Medical History  Diagnosis Date  . Breast cancer 1992    right breast   . Hypertension   . Blepharitis   . Osteoporosis   . Ectopic pregnancy   . Colon polyp     Past Surgical History  Procedure Laterality Date  . Ectopic pregnancy surgery      salpingectomy  . Colonoscopy w/ polypectomy    . Hand surgery  2007    right hand-ruptured tendon  . Cataract extraction  2010, 2011    2010 right eye, 2011 left eye  . Cyst removed  2012    from right wrist  . Total abdominal hysterectomy w/ bilateral salpingoophorectomy  1994  . Knee arthroscopy  1998  . Laparotomy N/A 03/22/2014    Procedure: EXPLORATORY LAPAROTOMY;  Surgeon: Merrie Roof, MD;  Location: [facility];  Service: General;  Laterality: N/A;  . Orif elbow fracture Left 10/15/2014    Procedure:  OPEN REDUCTION INTERNAL FIXATION (ORIF) ELBOW/OLECRANON FRACTURE;  Surgeon: Renette Butters, MD;  Location: [facility];  Service: Orthopedics;  Laterality: Left;  stryker elbow plate   . Left heart catheterization with coronary angiogram N/A 03/25/2014    Procedure: LEFT HEART CATHETERIZATION WITH CORONARY ANGIOGRAM;  Surgeon: Jettie Booze, MD;  Location: [facility] C[facility] LAB;  Service: Cardiovascular;  Laterality: N/A;    Current Outpatient Prescriptions  Medication Sig Dispense Refill  . aspirin 81 MG tablet Take 81 mg by mouth daily.    . BD PEN NEEDLE NANO U/F 32G X 4 MM MISC     . Calcium Carbonate-Vitamin D (CALCIUM + D PO) Take 600 mg by mouth 3 (three) times daily.    . carvedilol (COREG) 12.5 MG tablet TAKE 1 TABLET TWICE DAILY WITH FOOD. 60 tablet 1  . clobetasol cream (TEMOVATE) 9.83 % Apply 1 application topically as needed (For itching on back).     . doxycycline (VIBRA-TABS) 100 MG tablet Take 100 mg by mouth every evening.     Marland Kitchen FORTEO 600 MCG/2.4ML SOLN Inject 20 mcg into the skin daily.     Marland Kitchen MYRBETRIQ 25 MG TB24 tablet     . OVER THE COUNTER MEDICATION Place 1 application into both eyes as needed. occu scrub    . polyethylene glycol (MIRALAX / GLYCOLAX) packet  Take 17 g by mouth as needed for mild constipation.    . potassium chloride (K-DUR) 10 MEQ tablet Take 10 mEq by mouth 2 (two) times daily.     Marland Kitchen Propylene Glycol (SYSTANE BALANCE) 0.6 % SOLN Place 1 drop into both eyes daily.     . simvastatin (ZOCOR) 20 MG tablet Take 20 mg by mouth every evening.     Marland Kitchen telmisartan (MICARDIS) 80 MG tablet Take 1 tablet (80 mg total) by mouth daily. 30 tablet 3   No current facility-administered medications for this visit.    Allergies:   No Known Allergies  Social History:  The patient  reports that she has quit smoking. She has never used smokeless tobacco. She reports that she drinks about 3.0 oz of alcohol per week. She reports that she does not use illicit drugs.   Family History    Problem Relation Age of Onset  . Osteoporosis Mother   . CVA Father   . Hypertension Father   . Arthritis Mother   . Hypertension Mother     ROS:  Please see the history of present illness.  Denies any syncope, dizziness. Positive mild ankle edema in the evenings. No orthopnea, no PND   All other systems reviewed and negative.   PHYSICAL EXAM: VS:  BP 126/66 mmHg  Pulse 69  Ht 5' (1.524 m)  Wt 102 lb 4 oz (46.38 kg)  BMI 19.97 kg/m2  LMP 12/31/1992 Thin, in no acute distress HEENT: normal, [place]/AT, EOMI, head bandage, derm Neck: no JVD, normal carotid upstroke, no bruit Cardiac:  normal S1, S2; RRR; 2/6 systolic murmur LLSB mild radiation to carotids Lungs:  clear to auscultation bilaterally, no wheezing, rhonchi or rales Abd: soft, nontender, no hepatomegaly, no bruits Ext: no edema, 2+ distal pulses Skin: warm and dry, Ecchymoses noted GU: deferred Neuro: no focal abnormalities noted, AAO x 3  EKG:  Today 09/02/15-sinus rhythm, 69, PA-C, left bundle branch block appearance, right atrial enlargement.  Echocardiogram: 07/06/14-ejection fraction 35% up from 25%.  ASSESSMENT AND PLAN:  1. Nonischemic cardiomyopathy - cardiac catheterization reassuring. NYHA 1-2.  Uptitrated carvedilol to 12.5 twice a day. Continue with angiotensin receptor blocker. No indication of fluid overload. We did discuss the possibility of dangerous cardiac arrhythmias previously. Doing very well.  2. Hypertension-currently well controlled on current medication strategy. 3. Protein malnutrition-continue to encourage calories. 4. 6 month followup   Signed, Candee Furbish, MD [facility]  09/02/2015 2:54 PM

## 2015-09-07 ENCOUNTER — Other Ambulatory Visit: Payer: Self-pay | Admitting: Cardiology

## 2015-11-13 ENCOUNTER — Other Ambulatory Visit: Payer: Self-pay | Admitting: Cardiology

## 2015-11-14 DIAGNOSIS — I499 Cardiac arrhythmia, unspecified: Secondary | ICD-10-CM | POA: Diagnosis not present

## 2015-11-14 DIAGNOSIS — Z6821 Body mass index (BMI) 21.0-21.9, adult: Secondary | ICD-10-CM | POA: Diagnosis not present

## 2015-11-14 DIAGNOSIS — I429 Cardiomyopathy, unspecified: Secondary | ICD-10-CM | POA: Diagnosis not present

## 2015-11-14 DIAGNOSIS — R0789 Other chest pain: Secondary | ICD-10-CM | POA: Diagnosis not present

## 2015-11-16 ENCOUNTER — Telehealth: Payer: Self-pay | Admitting: *Deleted

## 2015-11-16 ENCOUNTER — Encounter: Payer: Self-pay | Admitting: Cardiology

## 2015-11-16 NOTE — Telephone Encounter (Signed)
-----   Message from Jerline Pain, MD sent at 11/14/2015  2:45 PM EST ----- Regarding: Needs appt.  Spoke to Dr. Dagmar Hait. She is having some CHF symptoms (EF 35%) Started lasix 20 qd Please have her come in for appt me or APP May need a monitor, having some palps, NSR in office.   Candee Furbish, MD

## 2015-11-16 NOTE — Telephone Encounter (Signed)
Left message for pt to c/b to f/u how she is feeling and to schedule a f/u appt per Dr Marlou Porch.

## 2015-11-17 ENCOUNTER — Ambulatory Visit (INDEPENDENT_AMBULATORY_CARE_PROVIDER_SITE_OTHER): Payer: Medicare PPO | Admitting: Cardiology

## 2015-11-17 ENCOUNTER — Encounter: Payer: Self-pay | Admitting: Cardiology

## 2015-11-17 VITALS — BP 132/76 | HR 61 | Ht 60.0 in | Wt 106.4 lb

## 2015-11-17 DIAGNOSIS — Z79899 Other long term (current) drug therapy: Secondary | ICD-10-CM | POA: Diagnosis not present

## 2015-11-17 DIAGNOSIS — I2109 ST elevation (STEMI) myocardial infarction involving other coronary artery of anterior wall: Secondary | ICD-10-CM | POA: Diagnosis not present

## 2015-11-17 DIAGNOSIS — I5041 Acute combined systolic (congestive) and diastolic (congestive) heart failure: Secondary | ICD-10-CM | POA: Diagnosis not present

## 2015-11-17 LAB — BASIC METABOLIC PANEL
BUN: 20 mg/dL (ref 7–25)
CALCIUM: 9.1 mg/dL (ref 8.6–10.4)
CO2: 25 mmol/L (ref 20–31)
Chloride: 102 mmol/L (ref 98–110)
Creat: 0.8 mg/dL (ref 0.60–0.88)
Glucose, Bld: 107 mg/dL — ABNORMAL HIGH (ref 65–99)
POTASSIUM: 3.7 mmol/L (ref 3.5–5.3)
Sodium: 138 mmol/L (ref 135–146)

## 2015-11-17 MED ORDER — FUROSEMIDE 20 MG PO TABS: 40.0000 mg | ORAL_TABLET | Freq: Every day | ORAL | Status: DC

## 2015-11-17 MED ORDER — POTASSIUM CHLORIDE ER 10 MEQ PO TBCR: 30.0000 meq | EXTENDED_RELEASE_TABLET | Freq: Once | ORAL | Status: DC

## 2015-11-17 NOTE — Progress Notes (Signed)
11/17/2015 Kimberly Jordan   March 09, 1929  [id number]  Primary Physician Tivis Ringer, MD Primary Cardiologist: Dr. Marlou Porch   Reason for Visit/CC: Acute on Chronic Systolic CHF  HPI:  81 y/o female, former [place], followed by Dr. Marlou Porch, with a h/o chronic systolic CHF. She was hospitalized in March of 2015 with small bowel obstruction and probable stress induced cardiomyopathy with ejection fraction of 20%. Typical morphology for this.   She was taken to the cardiac catheterization lab on an emergent basis because of fear of ST elevation myocardial infarction. She had no obstructive coronary disease.  She was hospitalized once again on 10/14/14 after mechanical fall and ulnar fracture. Her heart failure was well compensated. Her ejection fraction had mildly increased from 20% up to 35%.  She recently has developed s/s of acute CHF exacerbation, noting DOE, orthopnea, abdominal distention and LEE. She was seen by her PCP Dr. Doylene Canning on 11/15 and BNP was elevated at 2123. He placed her on 20 mg of Lasix. She was instructed to f/u in our office for further management.   She notes some improvement since starting lasix, but continues to have DOE and orthopnea. She is not getting any rest at night. She continues to have 1+ ankle edema and bibasilar rales on exam. She feels that she has not had any noticeably increased diureses since starting lasix. She does admit to a high sodium diet. She does not check her weight daily. She is currently in no respiratory distress and denies any symptoms at rest.   Her EKG today shows SR with a chronic LBBB.    Current Outpatient Prescriptions  Medication Sig Dispense Refill  . furosemide (LASIX) 20 MG tablet Take 1 tablet by mouth daily.    Marland Kitchen aspirin 81 MG tablet Take 81 mg by mouth daily.    . Calcium Carbonate-Vitamin D (CALCIUM + D PO) Take 600 mg by mouth 3 (three) times daily.    . carvedilol (COREG) 12.5 MG tablet TAKE 1 TABLET TWICE  DAILY WITH FOOD. 60 tablet 6  . clobetasol cream (TEMOVATE) AB-123456789 % Apply 1 application topically as needed (For itching on back).     . doxycycline (VIBRA-TABS) 100 MG tablet Take 100 mg by mouth every evening.     Marland Kitchen OVER THE COUNTER MEDICATION Place 1 application into both eyes as needed. occu scrub    . polyethylene glycol (MIRALAX / GLYCOLAX) packet Take 17 g by mouth as needed for mild constipation.    . potassium chloride (K-DUR) 10 MEQ tablet Take 10 mEq by mouth 2 (two) times daily.     Marland Kitchen Propylene Glycol (SYSTANE BALANCE) 0.6 % SOLN Place 1 drop into both eyes daily.     . simvastatin (ZOCOR) 20 MG tablet Take 20 mg by mouth every evening.     Marland Kitchen telmisartan (MICARDIS) 80 MG tablet Take 1 tablet (80 mg total) by mouth daily. 30 tablet 9   No current facility-administered medications for this visit.    No Known Allergies  Social History   Social History  . Marital Status: Married    Spouse Name: N/A  . Number of Children: N/A  . Years of Education: N/A   Occupational History  . Not on file.   Social History Main Topics  . Smoking status: Former Research scientist (life sciences)  . Smokeless tobacco: Never Used  . Alcohol Use: 3.0 oz/week    5 Glasses of wine per week     Comment: wine daily  . Drug  Use: No  . Sexual Activity: No     Comment: TAH/BSO   Other Topics Concern  . Not on file   Social History Narrative     Review of Systems: General: negative for chills, fever, night sweats or weight changes.  Cardiovascular: negative for chest pain, dyspnea on exertion, edema, orthopnea, palpitations, paroxysmal nocturnal dyspnea or shortness of breath Dermatological: negative for rash Respiratory: negative for cough or wheezing Urologic: negative for hematuria Abdominal: negative for nausea, vomiting, diarrhea, bright red blood per rectum, melena, or hematemesis Neurologic: negative for visual changes, syncope, or dizziness All other systems reviewed and are otherwise negative except as  noted above.    Blood pressure 132/76, pulse 61, height 5' (1.524 m), weight 106 lb 6.4 oz (48.263 kg), last menstrual period 12/31/1992, SpO2 95 %.  General appearance: alert, cooperative and no distress Neck: no carotid bruit and no JVD Lungs: bibasilar rales, no wheezing Heart: regular rate and rhythm, S1, S2 normal, no murmur, click, rub or gallop Extremities: 1+ bilateral ankle edema Pulses: 2+ and symmetric Skin: warm and dry Neurologic: Grossly normal  EKG NRS with chronic LBBB  ASSESSMENT AND PLAN:   1. Acute on Chronic Systolic CHF: known nonischemic cardiomyopathy with EF of 35%. BNP elevated at 2123. She has signs of volume overload on exam with 1+ bilateral ankle edema and bibasilar rales. She is asymptomatic at rest. O2 sats are 95% on RA. She was placed on a low dose of Lasix, by her PCP, 2 days ago but has not noticed any profound diuresis. Her renal function was normal prior to start of lasix. We will recheck a BMP and BNP today. We will further increase dose to 40 mg and increase K-dur to 30 mEq daily. We discussed strict sodium restriction and daily weights. We will have her f/u next week for repeat laboratory work to reassess renal function and electrolytes. She will f/u with an extender to reassess her volume status. Continue BB and ARB for LV dysfunction.   PLAN Repeat BMP and BNP today. Increase Lasix to 40 mg and K-dur to 30 mEq. F/u next week to reassess and repeat lab work.    Lyda Jester PA-C 11/17/2015 11:50 AM

## 2015-11-17 NOTE — Patient Instructions (Signed)
Medication Instructions:  Please increase your Furosemide to 40 mg a day (2 tablets) and your Potassium to 30 MEQ a day (3 tablets) Continue all other medications as listed.  Labwork: Please have blood work today (BNP and BMP)  Follow-Up: Follow up in 1 week.  If you need a refill on your cardiac medications before your next appointment, please call your pharmacy.  Thank you for choosing [facility]!!

## 2015-11-17 NOTE — Telephone Encounter (Signed)
Scheduled pt to be seen today at 11:30 am with Ellen Henri.

## 2015-11-18 LAB — BRAIN NATRIURETIC PEPTIDE: BRAIN NATRIURETIC PEPTIDE: 2262.8 pg/mL — AB (ref 0.0–100.0)

## 2015-11-21 ENCOUNTER — Ambulatory Visit: Payer: Medicare PPO | Admitting: Cardiology

## 2015-11-22 ENCOUNTER — Encounter: Payer: Self-pay | Admitting: Cardiology

## 2015-11-22 ENCOUNTER — Ambulatory Visit (INDEPENDENT_AMBULATORY_CARE_PROVIDER_SITE_OTHER): Payer: Medicare PPO | Admitting: Cardiology

## 2015-11-22 VITALS — BP 130/66 | HR 46 | Ht 60.0 in | Wt 98.1 lb

## 2015-11-22 DIAGNOSIS — I428 Other cardiomyopathies: Secondary | ICD-10-CM

## 2015-11-22 DIAGNOSIS — I5041 Acute combined systolic (congestive) and diastolic (congestive) heart failure: Secondary | ICD-10-CM | POA: Diagnosis not present

## 2015-11-22 DIAGNOSIS — R001 Bradycardia, unspecified: Secondary | ICD-10-CM

## 2015-11-22 DIAGNOSIS — I429 Cardiomyopathy, unspecified: Secondary | ICD-10-CM

## 2015-11-22 DIAGNOSIS — R0602 Shortness of breath: Secondary | ICD-10-CM | POA: Diagnosis not present

## 2015-11-22 LAB — BASIC METABOLIC PANEL
BUN: 19 mg/dL (ref 7–25)
CHLORIDE: 99 mmol/L (ref 98–110)
CO2: 28 mmol/L (ref 20–31)
Calcium: 9.3 mg/dL (ref 8.6–10.4)
Creat: 0.77 mg/dL (ref 0.60–0.88)
Glucose, Bld: 90 mg/dL (ref 65–99)
POTASSIUM: 4 mmol/L (ref 3.5–5.3)
SODIUM: 137 mmol/L (ref 135–146)

## 2015-11-22 MED ORDER — POTASSIUM CHLORIDE ER 10 MEQ PO TBCR: 10.0000 meq | EXTENDED_RELEASE_TABLET | Freq: Every day | ORAL | Status: DC

## 2015-11-22 MED ORDER — FUROSEMIDE 20 MG PO TABS: ORAL_TABLET | ORAL | Status: DC

## 2015-11-22 NOTE — Patient Instructions (Signed)
Medication Instructions:  1) START taking Lasix 20mg  once daily Monday through Friday.  On Saturday and Sunday take 2 tablets to equal 40mg . 2) DECREASE your Potassium to 1mEq once daily.  Labwork: BMP and BNP today  Testing/Procedures: None  Follow-Up: Your physician recommends that you schedule a follow-up appointment next available with Dr. Marlou Porch.  Any Other Special Instructions Will Be Listed Below (If Applicable).     If you need a refill on your cardiac medications before your next appointment, please call your pharmacy.

## 2015-11-22 NOTE — Progress Notes (Signed)
Cardiology Office Note   Date:  11/22/2015   ID:  Kimberly Jordan, DOB 07/06/1929, MRN [id number]  PCP:  Tivis Ringer, MD  Cardiologist:  Dr. Marlou Porch    Chief Complaint  Patient presents with  . Shortness of Breath    improved       History of Present Illness: Kimberly Jordan is a 81 y.o. female who presents for  Recent acute HF.73  81 y/o female, former [place], followed by Dr. Marlou Porch, with a h/o chronic systolic CHF due to NICM. She was hospitalized in March of 2015 with small bowel obstruction and probable stress induced cardiomyopathy with ejection fraction of 20%. Typical morphology for this.   She was taken to the cardiac catheterization lab on an emergent basis because of fear of ST elevation myocardial infarction. She had no obstructive coronary disease.  She was hospitalized once again on 10/14/14 after mechanical fall and ulnar fracture. Her heart failure was well compensated. Her ejection fraction had mildly increased from 20% up to 35%.  She recently has developed s/s of acute CHF exacerbation, noting DOE, orthopnea, abdominal distention and LEE. She was seen by her PCP Dr. Doylene Canning on 11/15 and BNP was elevated at 2123. He placed her on 20 mg of Lasix. She was instructed to f/u in our office for further management.   On last visit she continued to have DOE and orthopnea. She was not getting any rest at night. She continues to have 1+ ankle edema and bibasilar rales on exam. She did admit to a high sodium diet and was instructed on decreasing Na.. She does not check her weight daily.   Her  BMP and BNP was recheck and increased her  Lasix to 40 mg and K-dur to 30 mEq.   Today she feels better over 50% better.  Her wt is down 8 lbs at 98 she normally weighs at 103.  No chest pain and only SOB with walking up stairs.  Her HR was low on initial check but recheck 54.  She was SR on last visit.  She does have LBBB and rt ward axis.   No lightheadedness no  syncope.    BNP remained elevated at 2262.  Will recheck today.  Past Medical History  Diagnosis Date  . Breast cancer ([place]) 1992    right breast   . Hypertension   . Blepharitis   . Osteoporosis   . Ectopic pregnancy   . Colon polyp     Past Surgical History  Procedure Laterality Date  . Ectopic pregnancy surgery      salpingectomy  . Colonoscopy w/ polypectomy    . Hand surgery  2007    right hand-ruptured tendon  . Cataract extraction  2010, 2011    2010 right eye, 2011 left eye  . Cyst removed  2012    from right wrist  . Total abdominal hysterectomy w/ bilateral salpingoophorectomy  1994  . Knee arthroscopy  1998  . Laparotomy N/A 03/22/2014    Procedure: EXPLORATORY LAPAROTOMY;  Surgeon: Merrie Roof, MD;  Location: [facility];  Service: General;  Laterality: N/A;  . Orif elbow fracture Left 10/15/2014    Procedure: OPEN REDUCTION INTERNAL FIXATION (ORIF) ELBOW/OLECRANON FRACTURE;  Surgeon: Renette Butters, MD;  Location: [facility];  Service: Orthopedics;  Laterality: Left;  stryker elbow plate   . Left heart catheterization with coronary angiogram N/A 03/25/2014    Procedure: LEFT HEART CATHETERIZATION WITH CORONARY ANGIOGRAM;  Surgeon:  Jettie Booze, MD;  [facility]: [facility] C[facility];  Service: Cardiovascular;  Laterality: N/A;     Current Outpatient Prescriptions  Medication Sig Dispense Refill  . aspirin 81 MG tablet Take 81 mg by mouth daily.    . Calcium Carbonate-Vitamin D (CALCIUM + D PO) Take 600 mg by mouth 3 (three) times daily.    . carvedilol (COREG) 12.5 MG tablet TAKE 1 TABLET TWICE DAILY WITH FOOD. 60 tablet 6  . clobetasol cream (TEMOVATE) AB-123456789 % Apply 1 application topically as needed (For itching on back).     . doxycycline (VIBRA-TABS) 100 MG tablet Take 100 mg by mouth every evening.     Marland Kitchen OVER THE COUNTER MEDICATION Place 1 application into both eyes as needed. occu scrub    . polyethylene glycol (MIRALAX / GLYCOLAX) packet Take 17 g by mouth as  needed for mild constipation.    . potassium chloride (K-DUR) 10 MEQ tablet Take 30 mEq by mouth daily.    Marland Kitchen Propylene Glycol (SYSTANE BALANCE) 0.6 % SOLN Place 1 drop into both eyes daily.     . simvastatin (ZOCOR) 20 MG tablet Take 20 mg by mouth every evening.     Marland Kitchen telmisartan (MICARDIS) 80 MG tablet Take 1 tablet (80 mg total) by mouth daily. 30 tablet 9   No current facility-administered medications for this visit.    Allergies:   Review of patient's allergies indicates no known allergies.    Social History:  The patient  reports that she has quit smoking. She has never used smokeless tobacco. She reports that she drinks about 3.0 oz of alcohol per week. She reports that she does not use illicit drugs.   Family History:  The patient's family history includes Arthritis in her mother; CVA in her father; Hypertension in her father and mother; Osteoporosis in her mother.    ROS:  General:no colds or fevers, 8 lb weight loss Skin:no rashes or ulcers HEENT:no blurred vision, no congestion CV:see HPI PUL:see HPI GI:no diarrhea constipation or melena, no indigestion GU:no hematuria, no dysuria MS:no joint pain, no claudication Neuro:no syncope, no lightheadedness Endo:no diabetes, no thyroid disease   Wt Readings from Last 3 Encounters:  11/22/15 98 lb 1.9 oz (44.507 kg)  11/17/15 106 lb 6.4 oz (48.263 kg)  09/02/15 102 lb 4 oz (46.38 kg)     PHYSICAL EXAM: VS:  BP 130/66 mmHg  Pulse 46  Ht 5' (1.524 m)  Wt 98 lb 1.9 oz (44.507 kg)  BMI 19.16 kg/m2  LMP 12/31/1992 , BMI Body mass index is 19.16 kg/(m^2). General:Pleasant affect, NAD Skin:Warm and dry, brisk capillary refill HEENT:normocephalic, sclera clear, mucus membranes moist Neck:supple, no JVD, no bruits  Heart:S1S2 RRR without murmur, gallup, rub or click Lungs:clear without rales, rhonchi, or wheezes [id number], non tender, + BS, do not palpate liver spleen or masses Ext:no lower ext edema, 2+ pedal pulses, 2+  radial pulses Neuro:alert and oriented, MAE, follows commands, + facial symmetry    EKG:  EKG is NOT ordered today.    Recent Labs: 11/17/2015: BUN 20; Creat 0.80; Potassium 3.7; Sodium 138    Lipid Panel No results found for: CHOL, TRIG, HDL, CHOLHDL, VLDL, LDLCALC, LDLDIRECT     Other studies Reviewed: Additional studies/ records that were reviewed today include: previous notes and EKGs.  labs.   ASSESSMENT AND PLAN:  1.  Acute on chronic systolic HF, improving will decrease lasix to 20 daily M-F but 40 mg Sat and Sunday.  Will  decrease K+ to 10 meq BID. Recheck labs today with possible further changes.  She will call if further problems  2. Bradycardia today but recheck was in 32s asymptomatic.  Will have her return next week for follow up with EKG   3. NICM, if edema- CHF continues would repeat Echo.  Or if BNP not decreasing.   She will other wise follow up with Dr. Marlou Porch first available.    Current medicines are reviewed with the patient today.  The patient Has no concerns regarding medicines.  The following changes have been made:  See above Labs/ tests ordered today include:see above  Disposition:   FU:  see above  Lennie Muckle, NP  11/22/2015 12:10 PM    [facility] [place], [place], [place], [place] Phone: [phone]; Fax: [phone]

## 2015-11-23 LAB — BRAIN NATRIURETIC PEPTIDE: Brain Natriuretic Peptide: 746.8 pg/mL — ABNORMAL HIGH (ref 0.0–100.0)

## 2015-11-28 ENCOUNTER — Encounter: Payer: Self-pay | Admitting: Cardiology

## 2015-11-29 ENCOUNTER — Encounter: Payer: Self-pay | Admitting: *Deleted

## 2015-11-29 ENCOUNTER — Ambulatory Visit (INDEPENDENT_AMBULATORY_CARE_PROVIDER_SITE_OTHER): Payer: Medicare PPO | Admitting: *Deleted

## 2015-11-29 ENCOUNTER — Encounter: Payer: Self-pay | Admitting: Interventional Cardiology

## 2015-11-29 VITALS — BP 127/49 | HR 61 | Ht 60.0 in | Wt 99.4 lb

## 2015-11-29 DIAGNOSIS — R001 Bradycardia, unspecified: Secondary | ICD-10-CM

## 2015-11-29 NOTE — Progress Notes (Signed)
Patient in for nurse visit f/u EKG.  Stated that she was feeling well, no cp, sob or other discomforts.  EKG reviewed by Dr Tamala Julian.

## 2015-12-01 ENCOUNTER — Telehealth: Payer: Self-pay | Admitting: Obstetrics & Gynecology

## 2015-12-01 NOTE — Telephone Encounter (Signed)
Left message regarding upcoming appointment has been canceled and needs to be rescheduled. °

## 2015-12-02 DIAGNOSIS — H26493 Other secondary cataract, bilateral: Secondary | ICD-10-CM | POA: Diagnosis not present

## 2015-12-02 DIAGNOSIS — H01001 Unspecified blepharitis right upper eyelid: Secondary | ICD-10-CM | POA: Diagnosis not present

## 2015-12-02 DIAGNOSIS — H04123 Dry eye syndrome of bilateral lacrimal glands: Secondary | ICD-10-CM | POA: Diagnosis not present

## 2015-12-02 DIAGNOSIS — Z0100 Encounter for examination of eyes and vision without abnormal findings: Secondary | ICD-10-CM | POA: Diagnosis not present

## 2015-12-05 NOTE — Addendum Note (Signed)
Addended by: Freada Bergeron on: 12/05/2015 04:17 PM   Modules accepted: Orders

## 2015-12-06 ENCOUNTER — Other Ambulatory Visit: Payer: Self-pay

## 2015-12-06 NOTE — Telephone Encounter (Signed)
Medication Detail      Disp Refills Start End     furosemide (LASIX) 20 MG tablet 120 tablet 3 11/22/2015     Sig: Take 1 tablet by mouth daily Monday through Friday. Take 2 tablets = 40mg  once daily on Saturday and Sunday.    E-Prescribing Status: Receipt confirmed by pharmacy (11/22/2015 12:12 PM EST)     Pharmacy    [facility], [address]

## 2015-12-19 DIAGNOSIS — K5660 Unspecified intestinal obstruction: Secondary | ICD-10-CM | POA: Diagnosis not present

## 2015-12-19 DIAGNOSIS — Z681 Body mass index (BMI) 19 or less, adult: Secondary | ICD-10-CM | POA: Diagnosis not present

## 2015-12-19 DIAGNOSIS — I5021 Acute systolic (congestive) heart failure: Secondary | ICD-10-CM | POA: Diagnosis not present

## 2015-12-19 DIAGNOSIS — R7301 Impaired fasting glucose: Secondary | ICD-10-CM | POA: Diagnosis not present

## 2015-12-19 DIAGNOSIS — I10 Essential (primary) hypertension: Secondary | ICD-10-CM | POA: Diagnosis not present

## 2015-12-19 DIAGNOSIS — Z23 Encounter for immunization: Secondary | ICD-10-CM | POA: Diagnosis not present

## 2015-12-28 DIAGNOSIS — M810 Age-related osteoporosis without current pathological fracture: Secondary | ICD-10-CM | POA: Diagnosis not present

## 2015-12-28 DIAGNOSIS — K5660 Unspecified intestinal obstruction: Secondary | ICD-10-CM | POA: Diagnosis not present

## 2015-12-28 DIAGNOSIS — Z681 Body mass index (BMI) 19 or less, adult: Secondary | ICD-10-CM | POA: Diagnosis not present

## 2016-01-06 ENCOUNTER — Ambulatory Visit (INDEPENDENT_AMBULATORY_CARE_PROVIDER_SITE_OTHER): Payer: Medicare Other | Admitting: Cardiology

## 2016-01-06 ENCOUNTER — Encounter: Payer: Self-pay | Admitting: Cardiology

## 2016-01-06 VITALS — BP 126/62 | HR 78 | Ht 60.0 in | Wt 99.4 lb

## 2016-01-06 DIAGNOSIS — E43 Unspecified severe protein-calorie malnutrition: Secondary | ICD-10-CM | POA: Diagnosis not present

## 2016-01-06 DIAGNOSIS — I420 Dilated cardiomyopathy: Secondary | ICD-10-CM

## 2016-01-06 DIAGNOSIS — I429 Cardiomyopathy, unspecified: Secondary | ICD-10-CM | POA: Diagnosis not present

## 2016-01-06 DIAGNOSIS — I10 Essential (primary) hypertension: Secondary | ICD-10-CM | POA: Diagnosis not present

## 2016-01-06 DIAGNOSIS — I428 Other cardiomyopathies: Secondary | ICD-10-CM

## 2016-01-06 NOTE — Progress Notes (Signed)
[facility]. [address], [address] [place], [place]  [address] Phone: [phone] Fax:  [phone]  Date:  01/06/2016   ID:  Kimberly Jordan, DOB [birth year], MRN [id number]  PCP:  Kimberly Ringer, MD   History of Present Illness: Kimberly Jordan is a 82 y.o. female former [employer] [place] with hospitalization in March of 2015 with small bowel obstruction and probable stress induced cardiomyopathy with ejection fraction of 20%. Typical morphology for this.   She was taken to the cardiac catheterization lab on an emergent basis because of fear of ST elevation myocardial infarction. She had no obstructive coronary disease.  She was hospitalized once again on 10/14/14 after mechanical fall, ulnar fracture. Her heart failure was well compensated. Her ejection fraction has mildly increased from 20% up to 35%.   I had a telephone conversation with Kimberly Jordan regarding her heart condition.  At this time, she appears to be back on track and I agree with his updates on furosemide. No change with carvedilol. Hemoglobin 13.6, potassium 3.9, sodium 139, creatinine 0.8.  No SOB, no CP. No orthopnea. Overall enjoying the things she enjoys doing.  Tolerating medications well.  Husband fell and broke arm. Overall she is doing well. Changes. [facility]   currently battling with diarrhea, taking medication to help with this.   Wt Readings from Last 3 Encounters:  01/06/16 99 lb 6.4 oz (45.088 kg)  11/29/15 99 lb 6.4 oz (45.088 kg)  11/22/15 98 lb 1.9 oz (44.507 kg)     Past Medical History  Diagnosis Date  . Breast cancer ([place]) 1992    right breast   . Hypertension   . Blepharitis   . Osteoporosis   . Ectopic pregnancy   . Colon polyp     Past Surgical History  Procedure Laterality Date  . Ectopic pregnancy surgery      salpingectomy  . Colonoscopy w/ polypectomy    . Hand surgery  2007    right hand-ruptured tendon  . Cataract extraction  2010, 2011    2010 right eye,  2011 left eye  . Cyst removed  2012    from right wrist  . Total abdominal hysterectomy w/ bilateral salpingoophorectomy  1994  . Knee arthroscopy  1998  . Laparotomy N/A 03/22/2014    Procedure: EXPLORATORY LAPAROTOMY;  Surgeon: Merrie Roof, MD;  [facility]: [facility];  Service: General;  Laterality: N/A;  . Orif elbow fracture Left 10/15/2014    Procedure: OPEN REDUCTION INTERNAL FIXATION (ORIF) ELBOW/OLECRANON FRACTURE;  Surgeon: Renette Butters, MD;  Location: [facility];  Service: Orthopedics;  Laterality: Left;  stryker elbow plate   . Left heart catheterization with coronary angiogram N/A 03/25/2014    Procedure: LEFT HEART CATHETERIZATION WITH CORONARY ANGIOGRAM;  Surgeon: Jettie Booze, MD;  Location: [facility] C[facility] LAB;  Service: Cardiovascular;  Laterality: N/A;    Current Outpatient Prescriptions  Medication Sig Dispense Refill  . aspirin 81 MG tablet Take 81 mg by mouth daily.    . Calcium Carbonate-Vitamin D (CALCIUM + D PO) Take 600 mg by mouth 3 (three) times daily.    . carvedilol (COREG) 12.5 MG tablet TAKE 1 TABLET TWICE DAILY WITH FOOD. 60 tablet 6  . clobetasol cream (TEMOVATE) AB-123456789 % Apply 1 application topically as needed (For itching on back).     . doxycycline (VIBRA-TABS) 100 MG tablet Take 100 mg by mouth every evening.     . furosemide (LASIX)  20 MG tablet Take 1 tablet by mouth daily Monday through Friday.  Take 2 tablets = 40mg  once daily on Saturday and Sunday. 120 tablet 3  . OVER THE COUNTER MEDICATION Place 1 application into both eyes as needed. occu scrub    . polyethylene glycol (MIRALAX / GLYCOLAX) packet Take 17 g by mouth as needed for mild constipation.    . potassium chloride (K-DUR) 10 MEQ tablet Take 1 tablet (10 mEq total) by mouth daily. 90 tablet 3  . Propylene Glycol (SYSTANE BALANCE) 0.6 % SOLN Place 1 drop into both eyes daily.     . simvastatin (ZOCOR) 20 MG tablet Take 20 mg by mouth every evening.     Marland Kitchen telmisartan (MICARDIS) 80 MG tablet Take 1  tablet (80 mg total) by mouth daily. 30 tablet 9  . UNABLE TO FIND PT un aware of name Takes 3 tablets a day for 10 days. For diarrhea. Per Kimberly Jordan     No current facility-administered medications for this visit.    Allergies:   No Known Allergies  Social History:  The patient  reports that she has quit smoking. She has never used smokeless tobacco. She reports that she drinks about 3.0 oz of alcohol per week. She reports that she does not use illicit drugs.   Family History  Problem Relation Age of Onset  . Osteoporosis Mother   . CVA Father   . Hypertension Father   . Arthritis Mother   . Hypertension Mother   . Stroke Father   . Migraines Father     ROS:  Please see the history of present illness.  Denies any syncope, dizziness. Positive mild ankle edema in the evenings. No orthopnea, no PND   All other systems reviewed and negative.   PHYSICAL EXAM: VS:  BP 126/62 mmHg  Pulse 78  Ht 5' (1.524 m)  Wt 99 lb 6.4 oz (45.088 kg)  BMI 19.41 kg/m2  LMP 12/31/1992 Thin, in no acute distress HEENT: normal, [place]/AT, EOMI, head bandage, derm Neck: no JVD, normal carotid upstroke, no bruit Cardiac:  normal S1, S2; RRR; 2/6 systolic murmur LLSB mild radiation to carotids Lungs:  clear to auscultation bilaterally, no wheezing, rhonchi or rales Abd: soft, nontender, no hepatomegaly, no bruits Ext: no edema, 2+ distal pulses Skin: warm and dry, Ecchymoses noted GU: deferred Neuro: no focal abnormalities noted, AAO x 3  EKG:  Today 09/02/15-sinus rhythm, 69, PA-C, left bundle branch block appearance, right atrial enlargement.  Echocardiogram: 07/06/14-ejection fraction 35% up from 25%.  ASSESSMENT AND PLAN:  1. Nonischemic cardiomyopathy - cardiac catheterization reassuring. NYHA 1-2.  Uptitrated carvedilol to 12.5 twice a day. Continue with angiotensin receptor blocker. No indication of fluid overload.  Previously I discussed with Kimberly Jordan.  At this time, no further cardiac testing.  We did discuss the possibility of dangerous cardiac arrhythmias previously. Doing very well  Currently from a cardiac perspective , working on diarrhea. 2. Hypertension-currently well controlled on current medication strategy. 3. Protein malnutrition-continue to encourage calories. 4. 6 month followup   Signed, Candee Furbish, MD [facility]  01/06/2016 10:59 AM

## 2016-01-06 NOTE — Patient Instructions (Signed)

## 2016-01-13 ENCOUNTER — Encounter: Payer: Self-pay | Admitting: Gastroenterology

## 2016-01-19 ENCOUNTER — Other Ambulatory Visit (HOSPITAL_COMMUNITY): Payer: Self-pay | Admitting: *Deleted

## 2016-01-20 ENCOUNTER — Ambulatory Visit (HOSPITAL_COMMUNITY)
Admission: RE | Admit: 2016-01-20 | Discharge: 2016-01-20 | Disposition: A | Discharge status: Home / Self Care | Payer: Medicare Other | Source: Ambulatory Visit | Attending: Internal Medicine | Admitting: Internal Medicine

## 2016-01-20 DIAGNOSIS — M810 Age-related osteoporosis without current pathological fracture: Secondary | ICD-10-CM | POA: Diagnosis not present

## 2016-01-20 MED ORDER — ZOLEDRONIC ACID 5 MG/100ML IV SOLN
5.0000 mg | Freq: Once | INTRAVENOUS | Status: DC
Start: 2016-01-20 — End: 2016-01-21

## 2016-01-20 MED ORDER — ZOLEDRONIC ACID 5 MG/100ML IV SOLN
INTRAVENOUS | Status: AC
  Administered 2016-01-20: 5 mg
  Filled 2016-01-20: qty 100

## 2016-01-20 NOTE — Discharge Instructions (Signed)

## 2016-02-24 ENCOUNTER — Ambulatory Visit (INDEPENDENT_AMBULATORY_CARE_PROVIDER_SITE_OTHER): Payer: Medicare Other | Admitting: Obstetrics & Gynecology

## 2016-02-24 ENCOUNTER — Encounter: Payer: Self-pay | Admitting: Obstetrics & Gynecology

## 2016-02-24 VITALS — BP 132/60 | HR 62 | Resp 16 | Ht 60.0 in | Wt 96.0 lb

## 2016-02-24 DIAGNOSIS — Z01419 Encounter for gynecological examination (general) (routine) without abnormal findings: Secondary | ICD-10-CM | POA: Diagnosis not present

## 2016-02-24 NOTE — Progress Notes (Signed)
82 y.o. Kimberly Jordan:[id number] MarriedCaucasianF here for annual exam.  Pt doing well.  Reports she is now on a diuretic which has "cramped her style".  Denies vaginal bleeding.  Reports significant weight loss after being diagnosed with pancreatic insufficiency.  Is now on Creon TID.  Pt reports weight loss from this but that has stabilized.  She's been on the Creon for two to three weeks.  Is off the Miralax.  PCP:  Dr. Dagmar Hait.  Reports she just saw him last week.  Pt has follow-up for her "annual exam" in August.   Patient's last menstrual period was 12/31/1992.          Sexually active: No.  The current method of family planning is status post hysterectomy.    Exercising: Yes.    Walking Smoker:  no  Health Maintenance: Pap:  04/28/2007 Neg History of abnormal Pap:  no MMG: 02/24/15 [place] Right BIRADS2:Benign.  D/w ACS guidelines. Colonoscopy:  2005 Normal  BMD:   2014 TDaP: w/ PCP Screening Labs: PCP, Hb today: PCP, Urine today: PCP   reports that she has quit smoking. She has never used smokeless tobacco. She reports that she drinks about 3.0 oz of alcohol per week. She reports that she does not use illicit drugs.  Past Medical History  Diagnosis Date  . Breast cancer ([place]) 1992    right breast   . Hypertension   . Blepharitis   . Osteoporosis   . Ectopic pregnancy   . Colon polyp     Past Surgical History  Procedure Laterality Date  . Ectopic pregnancy surgery      salpingectomy  . Colonoscopy w/ polypectomy    . Hand surgery  2007    right hand-ruptured tendon  . Cataract extraction  2010, 2011    2010 right eye, 2011 left eye  . Cyst removed  2012    from right wrist  . Total abdominal hysterectomy w/ bilateral salpingoophorectomy  1994  . Knee arthroscopy  1998  . Laparotomy N/A 03/22/2014    Procedure: EXPLORATORY LAPAROTOMY;  Surgeon: Merrie Roof, MD;  [facility];  Service: General;  Laterality: N/A;  . Orif elbow fracture Left 10/15/2014    Procedure: OPEN REDUCTION  INTERNAL FIXATION (ORIF) ELBOW/OLECRANON FRACTURE;  Surgeon: Renette Butters, MD;  Location: [facility];  Service: Orthopedics;  Laterality: Left;  stryker elbow plate   . Left heart catheterization with coronary angiogram N/A 03/25/2014    Procedure: LEFT HEART CATHETERIZATION WITH CORONARY ANGIOGRAM;  Surgeon: Jettie Booze, MD;  Location: [facility] CATH LAB;  Service: Cardiovascular;  Laterality: N/A;    Family History  Problem Relation Age of Onset  . Osteoporosis Mother   . CVA Father   . Hypertension Father   . Arthritis Mother   . Hypertension Mother   . Stroke Father   . Migraines Father     ROS:  Pertinent items are noted in HPI.  Otherwise, a comprehensive ROS was negative.  Exam:   BP 132/60 mmHg  Pulse 62  Resp 16  Ht 5' (1.524 m)  Wt 96 lb (43.545 kg)  BMI 18.75 kg/m2  LMP 12/31/1992  Weight change: -7#   Height: 5' (152.4 cm)  Ht Readings from Last 3 Encounters:  02/24/16 5' (1.524 m)  01/20/16 5' (1.524 m)  01/06/16 5' (1.524 m)    General appearance: alert, cooperative and appears stated age Head: Normocephalic, without obvious abnormality, atraumatic Neck: no adenopathy, supple, symmetrical, trachea midline and thyroid  normal to inspection and palpation Lungs: clear to auscultation bilaterally Breasts: normal appearance, no masses or tenderness Heart: regular rate and rhythm Abdomen: soft, non-tender; bowel sounds normal; no masses,  no organomegaly Extremities: extremities normal, atraumatic, no cyanosis or edema Skin: Skin color, texture, turgor normal. No rashes or lesions Lymph nodes: Cervical, supraclavicular, and axillary nodes normal. No abnormal inguinal nodes palpated Neurologic: Grossly normal   Pelvic: External genitalia:  no lesions              Urethra:  normal appearing urethra with no masses, tenderness or lesions              Bartholins and Skenes: normal                 Vagina: normal appearing vagina with normal color and discharge, no  lesions              Cervix: no lesions              Pap taken: No. Bimanual Exam:  Uterus:  normal size, contour, position, consistency, mobility, non-tender              Adnexa: normal adnexa and no mass, fullness, tenderness               Rectovaginal: Confirms               Anus:  normal sphincter tone, no lesions  Chaperone was present for exam.  A:  Well Woman with normal exam PMP, no HRT H/O breast cancer 1992 H/O TAH/BSO Breast mass along right breast scar H/O MI and now with cardiomyopathy.  Dr. Marlou Porch following.   Hypertension  P: Diagnostic MMG will be scheduled.  Labs and vaccines with Dr. Dagmar Hait return annually or prn

## 2016-03-05 ENCOUNTER — Ambulatory Visit: Payer: Medicare Other | Admitting: Gastroenterology

## 2016-03-07 ENCOUNTER — Encounter: Payer: Self-pay | Admitting: Gastroenterology

## 2016-04-16 ENCOUNTER — Other Ambulatory Visit: Payer: Self-pay | Admitting: Cardiology

## 2016-04-27 ENCOUNTER — Ambulatory Visit: Payer: Medicare HMO | Admitting: Obstetrics & Gynecology

## 2016-08-07 ENCOUNTER — Other Ambulatory Visit: Payer: Self-pay | Admitting: Cardiology

## 2016-08-14 ENCOUNTER — Other Ambulatory Visit: Payer: Self-pay | Admitting: Internal Medicine

## 2016-08-14 DIAGNOSIS — R911 Solitary pulmonary nodule: Secondary | ICD-10-CM

## 2016-08-20 ENCOUNTER — Other Ambulatory Visit: Payer: Medicare Other

## 2016-08-22 ENCOUNTER — Ambulatory Visit
Admission: RE | Admit: 2016-08-22 | Discharge: 2016-08-22 | Disposition: A | Discharge status: Home / Self Care | Payer: Medicare Other | Source: Ambulatory Visit | Attending: Internal Medicine | Admitting: Internal Medicine

## 2016-08-22 DIAGNOSIS — R911 Solitary pulmonary nodule: Secondary | ICD-10-CM

## 2016-12-04 ENCOUNTER — Other Ambulatory Visit: Payer: Self-pay

## 2016-12-04 MED ORDER — FUROSEMIDE 20 MG PO TABS: ORAL_TABLET | ORAL | 1 refills | Status: DC

## 2017-01-03 ENCOUNTER — Other Ambulatory Visit (HOSPITAL_COMMUNITY): Payer: Self-pay | Admitting: *Deleted

## 2017-01-04 ENCOUNTER — Ambulatory Visit (HOSPITAL_COMMUNITY)
Admission: RE | Admit: 2017-01-04 | Discharge: 2017-01-04 | Disposition: A | Discharge status: Home / Self Care | Payer: Medicare Other | Source: Ambulatory Visit | Attending: Internal Medicine | Admitting: Internal Medicine

## 2017-01-04 DIAGNOSIS — M810 Age-related osteoporosis without current pathological fracture: Secondary | ICD-10-CM | POA: Insufficient documentation

## 2017-01-04 MED ORDER — ZOLEDRONIC ACID 5 MG/100ML IV SOLN
INTRAVENOUS | Status: AC
  Administered 2017-01-04: 5 mg
  Filled 2017-01-04: qty 100

## 2017-01-04 MED ORDER — ZOLEDRONIC ACID 5 MG/100ML IV SOLN: 5.0000 mg | Freq: Once | INTRAVENOUS | Status: DC

## 2017-01-16 ENCOUNTER — Other Ambulatory Visit: Payer: Self-pay | Admitting: Cardiology

## 2017-02-06 ENCOUNTER — Other Ambulatory Visit: Payer: Self-pay | Admitting: Cardiology

## 2017-02-15 ENCOUNTER — Encounter: Payer: Self-pay | Admitting: Cardiology

## 2017-02-16 ENCOUNTER — Other Ambulatory Visit: Payer: Self-pay | Admitting: Cardiology

## 2017-02-26 ENCOUNTER — Ambulatory Visit (INDEPENDENT_AMBULATORY_CARE_PROVIDER_SITE_OTHER): Payer: Medicare Other | Admitting: Cardiology

## 2017-02-26 ENCOUNTER — Encounter: Payer: Self-pay | Admitting: Cardiology

## 2017-02-26 VITALS — BP 148/86 | HR 71 | Ht 60.0 in | Wt 107.2 lb

## 2017-02-26 DIAGNOSIS — I10 Essential (primary) hypertension: Secondary | ICD-10-CM

## 2017-02-26 DIAGNOSIS — I5041 Acute combined systolic (congestive) and diastolic (congestive) heart failure: Secondary | ICD-10-CM

## 2017-02-26 DIAGNOSIS — I428 Other cardiomyopathies: Secondary | ICD-10-CM

## 2017-02-26 NOTE — Patient Instructions (Signed)

## 2017-02-26 NOTE — Progress Notes (Signed)
[facility]. [address] [place], [place]  [address] Phone: [phone] Fax:  [phone]  Date:  02/26/2017   ID:  Kimberly Jordan, DOB 24-Jan-1929, MRN [id number]  PCP:  Tivis Ringer, MD   History of Present Illness: Kimberly Jordan is a 83 y.o. female former 53 of [place] with hospitalization in March of 2015 with small bowel obstruction and probable stress induced cardiomyopathy with ejection fraction of 20%. Typical morphology for this.   She was taken to the cardiac catheterization lab on an emergent basis because of fear of ST elevation myocardial infarction. She had no obstructive coronary disease.  She was hospitalized once again on 10/14/14 after mechanical fall, ulnar fracture. Her heart failure was well compensated. Her ejection fraction has mildly increased from 20% up to 35%.   I had a telephone conversation with Dr. Dagmar Hait regarding her heart condition.  At this time, she appears to be back on track and I agree with his updates on furosemide. No change with carvedilol. Hemoglobin 13.6, potassium 3.9, sodium 139, creatinine 0.8.  No SOB, no CP. No orthopnea. Overall enjoying the things she enjoys doing. Her furosemide was discontinued. She seems to continue to do very well. Her husband has been battling viral illness.  Tolerating medications well.     Wt Readings from Last 3 Encounters:  02/26/17 107 lb 3.2 oz (48.6 kg)  01/04/17 110 lb (49.9 kg)  02/24/16 96 lb (43.5 kg)     Past Medical History:  Diagnosis Date  . Blepharitis   . Breast cancer ([place]) 1992   right breast   . Ectopic pregnancy   . Hypertension   . Osteoporosis   . 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 CATARACT EXTRACTION  2010, 2011   2010 right eye, 2011 left eye  . COLONOSCOPY W/ POLYPECTOMY    . cyst removed  2012   from right wrist  . ECTOPIC PREGNANCY SURGERY     salpingectomy  .  HAND SURGERY  2007   right hand-ruptured tendon  . KNEE ARTHROSCOPY  1998  . LAPAROTOMY N/A 03/22/2014   Procedure: EXPLORATORY LAPAROTOMY;  Surgeon: Merrie Roof, MD;  [facility];  Service: General;  Laterality: N/A;  . LEFT HEART CATHETERIZATION WITH CORONARY ANGIOGRAM N/A 03/25/2014   Procedure: LEFT HEART CATHETERIZATION WITH CORONARY ANGIOGRAM;  Surgeon: Jettie Booze, MD;  Location: [facility] CATH LAB;  Service: Cardiovascular;  Laterality: N/A;  . ORIF ELBOW FRACTURE Left 10/15/2014   Procedure: OPEN REDUCTION INTERNAL FIXATION (ORIF) ELBOW/OLECRANON FRACTURE;  Surgeon: Renette Butters, MD;  Location: [facility];  Service: Orthopedics;  Laterality: Left;  stryker elbow plate   . TOTAL ABDOMINAL HYSTERECTOMY W/ BILATERAL SALPINGOOPHORECTOMY  1994    Current Outpatient Prescriptions  Medication Sig Dispense Refill  . aspirin 81 MG tablet Take 81 mg by mouth daily.    . Calcium Carbonate-Vitamin D (CALCIUM + D PO) Take 600 mg by mouth 3 (three) times daily.    . carvedilol (COREG) 12.5 MG tablet Take 1 tablet (12.5 mg total) by mouth 2 (two) times daily with a meal. *Please keep 02/26/17 appointment for further refills* 60 tablet 0  . clobetasol cream (TEMOVATE) AB-123456789 % Apply 1 application topically as needed (For itching on back).     . CREON 24000 units CPEP Take 3 capsules by mouth 3 (three) times daily.    Marland Kitchen  doxycycline (VIBRA-TABS) 100 MG tablet Take 100 mg by mouth every evening.     Marland Kitchen OVER THE COUNTER MEDICATION Place 1 application into both eyes as needed. occu scrub    . potassium chloride (K-DUR) 10 MEQ tablet Take 1 tablet (10 mEq total) by mouth daily. 90 tablet 3  . simvastatin (ZOCOR) 20 MG tablet Take 20 mg by mouth every evening.     Marland Kitchen telmisartan (MICARDIS) 80 MG tablet Take 1 tablet (80 mg total) by mouth daily. 90 tablet 1   No current facility-administered medications for this visit.     Allergies:   No Known Allergies  Social History:  The patient  reports that  she has quit smoking. She has never used smokeless tobacco. She reports that she drinks about 3.0 oz of alcohol per week . She reports that she does not use drugs.   Family History  Problem Relation Age of Onset  . Osteoporosis Mother   . Arthritis Mother   . Hypertension Mother   . CVA Father   . Hypertension Father   . Stroke Father   . Migraines Father     ROS:  Please see the history of present illness.  Denies any syncope, dizziness. Positive mild ankle edema in the evenings. No orthopnea, no PND   All other systems reviewed and negative.   PHYSICAL EXAM: VS:  BP (!) 148/86   Pulse 71   Ht 5' (1.524 m)   Wt 107 lb 3.2 oz (48.6 kg)   LMP 12/31/1992   BMI 20.94 kg/m  Gen.: Thin, in no acute distress HEENT: normal, [place]/AT, EOMI, head bandage, derm Neck: no JVD, normal carotid upstroke, no bruit Cardiac:  normal S1, S2; RRR; 2/6 systolic murmur LLSB mild radiation to carotids Lungs:  clear to auscultation bilaterally, no wheezing, rhonchi or rales Abd: soft, nontender, no hepatomegaly, no bruits Ext: no edema, 2+ distal pulses Skin: warm and dry, Ecchymoses noted GU: deferred Neuro: no focal abnormalities noted, AAO x 3  EKG:  Today 02/26/17-sinus rhythm 71 with LVH, LBBB pattern and repolarization abnormality. Personally viewed-prior 09/02/15-sinus rhythm, 69, PA-C, left bundle branch block appearance, right atrial enlargement.  Echocardiogram: 07/06/14-ejection fraction 35% up from 25%.  ASSESSMENT AND PLAN:  1. Nonischemic cardiomyopathy - cardiac catheterization reassuring. NYHA 1-2.  Uptitrated carvedilol to 12.5 mg twice a day. I'm comfortable with her being off of furosemide. Continue with angiotensin receptor blocker. No indication of fluid overload.   2. Hypertension-currently well controlled on current medication strategy. 3. Protein malnutrition-continue to encourage calories. Her weight has increased. 4. 12 month followup   Signed, Candee Furbish, MD [facility]  02/26/2017  2:36 PM

## 2017-02-28 NOTE — Progress Notes (Signed)
83 y.o. G26P0031 Married Caucasian F here for annual exam.  Doing well.  Denies vaginal bleeding.    PCP:  Dr. Dagmar Hait.  Is seen every six months.  Just had appointment.  Did have blood work done a tthe last appt.    Patient's last menstrual period was 12/31/1992.          Sexually active: No.  The current method of family planning is status post hysterectomy.    Exercising: Yes.    walking Smoker:  Former smoker   Health Maintenance: Pap:  04/28/07 negative  History of abnormal Pap:  no MMG:  02/24/15 [place] Right Breast- BIRADS 2 benign  Colonoscopy:  2005 normal.  Denies rectal bleeding.  BMD:   2014, pt states she thinks she has had one since at Dr. Danna Hefty office TDaP:  PCP Pneumonia vaccine(s):  Unsure, but states she has had  Zostavax:   Done at [facility] ~ 10 years ago  Hep C testing: not indicated  Screening Labs: PCP, Hb today: PCP   reports that she has quit smoking. She has never used smokeless tobacco. She reports that she drinks about 3.0 oz of alcohol per week . She reports that she does not use drugs.  Past Medical History:  Diagnosis Date  . Blepharitis   . Breast cancer ([place]) 1992   right breast   . Ectopic pregnancy   . Hypertension   . Osteoporosis   . 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 CATARACT EXTRACTION  2010, 2011   2010 right eye, 2011 left eye  . COLONOSCOPY W/ POLYPECTOMY    . cyst removed  2012   from right wrist  . ECTOPIC PREGNANCY SURGERY     salpingectomy  . HAND SURGERY  2007   right hand-ruptured tendon  . KNEE ARTHROSCOPY  1998  . LAPAROTOMY N/A 03/22/2014   Procedure: EXPLORATORY LAPAROTOMY;  Surgeon: Merrie Roof, MD;  [facility];  Service: General;  Laterality: N/A;  . LEFT HEART CATHETERIZATION WITH CORONARY ANGIOGRAM N/A 03/25/2014   Procedure: LEFT HEART CATHETERIZATION WITH CORONARY ANGIOGRAM;  Surgeon: Jettie Booze, MD;   Location: [facility] CATH LAB;  Service: Cardiovascular;  Laterality: N/A;  . ORIF ELBOW FRACTURE Left 10/15/2014   Procedure: OPEN REDUCTION INTERNAL FIXATION (ORIF) ELBOW/OLECRANON FRACTURE;  Surgeon: Renette Butters, MD;  Location: [facility];  Service: Orthopedics;  Laterality: Left;  stryker elbow plate   . TOTAL ABDOMINAL HYSTERECTOMY W/ BILATERAL SALPINGOOPHORECTOMY  1994    Current Outpatient Prescriptions  Medication Sig Dispense Refill  . aspirin 81 MG tablet Take 81 mg by mouth daily.    . Calcium Carbonate-Vitamin D (CALCIUM + D PO) Take 600 mg by mouth 3 (three) times daily.    . carvedilol (COREG) 12.5 MG tablet Take 1 tablet (12.5 mg total) by mouth 2 (two) times daily with a meal. *Please keep 02/26/17 appointment for further refills* 60 tablet 0  . clobetasol cream (TEMOVATE) AB-123456789 % Apply 1 application topically as needed (For itching on back).     . CREON 24000 units CPEP Take 3 capsules by mouth 3 (three) times daily.    Marland Kitchen doxycycline (VIBRA-TABS) 100 MG tablet Take 100 mg by mouth every evening.     Marland Kitchen OVER THE COUNTER MEDICATION Place 1 application into both eyes as needed. occu scrub    . potassium chloride (K-DUR) 10 MEQ tablet Take  1 tablet (10 mEq total) by mouth daily. 90 tablet 3  . simvastatin (ZOCOR) 20 MG tablet Take 20 mg by mouth every evening.     Marland Kitchen telmisartan (MICARDIS) 80 MG tablet Take 1 tablet (80 mg total) by mouth daily. 90 tablet 1   No current facility-administered medications for this visit.     Family History  Problem Relation Age of Onset  . Osteoporosis Mother   . Arthritis Mother   . Hypertension Mother   . CVA Father   . Hypertension Father   . Stroke Father   . Migraines Father     ROS:  Pertinent items are noted in HPI.  Otherwise, a comprehensive ROS was negative.  Exam:   BP 132/80 (BP Location: Right Arm, Patient Position: Sitting, Cuff Size: Normal)   Pulse 62   Resp 12   Ht 5' 0.5" (1.537 m)   Wt 107 lb 6.4 oz (48.7 kg)   LMP  12/31/1992   BMI 20.63 kg/m    Height: 5' 0.5" (153.7 cm)  Ht Readings from Last 3 Encounters:  03/04/17 5' 0.5" (1.537 m)  02/26/17 5' (1.524 m)  01/04/17 5' (1.524 m)    General appearance: alert, cooperative and appears stated age Head: Normocephalic, without obvious abnormality, atraumatic Neck: no adenopathy, supple, symmetrical, trachea midline and thyroid normal to inspection and palpation Lungs: clear to auscultation bilaterally Breasts: normal appearance, no masses or tenderness on left breast, right breast with 2cm firmness along scar--feels exactly the same as last year Heart: regular rate and rhythm Abdomen: soft, non-tender; bowel sounds normal; no masses,  no organomegaly Extremities: extremities normal, atraumatic, no cyanosis or edema Skin: Skin color, texture, turgor normal. No rashes or lesions Lymph nodes: Cervical, supraclavicular, and axillary nodes normal. No abnormal inguinal nodes palpated Neurologic: Grossly normal   Pelvic: External genitalia:  no lesions              Urethra:  normal appearing urethra with no masses, tenderness or lesions              Bartholins and Skenes: normal                 Vagina: normal appearing vagina with normal color and discharge, no lesions              Cervix: absent              Pap taken: No. Bimanual Exam:  Uterus:  uterus absent              Adnexa: no mass, fullness, tenderness               Rectovaginal: Confirms               Anus:  normal sphincter tone, no lesions  Chaperone was present for exam.  A:  Well Woman with normal exam PMP, no HRT H/o breast cancer 1992 H/O MI with cardiomyopathy.  Dr. Marlou Porch follows.  Just had appt.  Recommended follow up 1 year Hypertension Hyperlipidemia Breast mass that has been imaged in the past and is calcified  P:   Mammogram will be scheduled.  Pt does desire that these continue pap smear not indicated Lab work and vaccines with Dr. Dagmar Hait return annually or prn

## 2017-03-04 ENCOUNTER — Ambulatory Visit (INDEPENDENT_AMBULATORY_CARE_PROVIDER_SITE_OTHER): Payer: Medicare Other | Admitting: Obstetrics & Gynecology

## 2017-03-04 ENCOUNTER — Encounter: Payer: Self-pay | Admitting: Obstetrics & Gynecology

## 2017-03-04 VITALS — BP 132/80 | HR 62 | Resp 12 | Ht 60.5 in | Wt 107.4 lb

## 2017-03-04 DIAGNOSIS — Z01419 Encounter for gynecological examination (general) (routine) without abnormal findings: Secondary | ICD-10-CM

## 2017-03-04 NOTE — Progress Notes (Addendum)
Spoke with Kimberly Jordan at [facility]. Patient scheduled for screening MMG while in office for 03/13/17 at 11:45am. Patient is agreeable to date and time.

## 2017-03-20 ENCOUNTER — Other Ambulatory Visit: Payer: Self-pay | Admitting: Cardiology

## 2017-03-28 ENCOUNTER — Encounter: Payer: Self-pay | Admitting: Obstetrics & Gynecology

## 2018-01-03 ENCOUNTER — Other Ambulatory Visit (HOSPITAL_COMMUNITY): Payer: Self-pay

## 2018-01-06 ENCOUNTER — Ambulatory Visit (HOSPITAL_COMMUNITY)
Admission: RE | Admit: 2018-01-06 | Discharge: 2018-01-06 | Disposition: A | Discharge status: Home / Self Care | Payer: Medicare Other | Source: Ambulatory Visit | Attending: Internal Medicine | Admitting: Internal Medicine

## 2018-01-06 DIAGNOSIS — M810 Age-related osteoporosis without current pathological fracture: Secondary | ICD-10-CM | POA: Diagnosis not present

## 2018-01-06 MED ORDER — ZOLEDRONIC ACID 5 MG/100ML IV SOLN
INTRAVENOUS | Status: AC
  Administered 2018-01-06: 5 mg via INTRAVENOUS
  Filled 2018-01-06: qty 100

## 2018-01-06 MED ORDER — ZOLEDRONIC ACID 5 MG/100ML IV SOLN
5.0000 mg | Freq: Once | INTRAVENOUS | Status: AC
  Administered 2018-01-06: 5 mg via INTRAVENOUS

## 2018-02-20 ENCOUNTER — Encounter: Payer: Self-pay | Admitting: Cardiology

## 2018-02-27 ENCOUNTER — Encounter: Payer: Self-pay | Admitting: Cardiology

## 2018-02-27 ENCOUNTER — Encounter (INDEPENDENT_AMBULATORY_CARE_PROVIDER_SITE_OTHER): Payer: Self-pay

## 2018-02-27 ENCOUNTER — Ambulatory Visit: Payer: Medicare Other | Admitting: Cardiology

## 2018-02-27 VITALS — BP 130/72 | HR 70 | Ht 60.0 in | Wt 109.8 lb

## 2018-02-27 DIAGNOSIS — I447 Left bundle-branch block, unspecified: Secondary | ICD-10-CM

## 2018-02-27 DIAGNOSIS — I5041 Acute combined systolic (congestive) and diastolic (congestive) heart failure: Secondary | ICD-10-CM | POA: Diagnosis not present

## 2018-02-27 DIAGNOSIS — I10 Essential (primary) hypertension: Secondary | ICD-10-CM | POA: Diagnosis not present

## 2018-02-27 DIAGNOSIS — I428 Other cardiomyopathies: Secondary | ICD-10-CM

## 2018-02-27 NOTE — Progress Notes (Signed)
[facility]. [address] [place], [place]  [address] Phone: [phone] Fax:  [phone]  Date:  02/27/2018   ID:  Kimberly Jordan, DOB 11/06/1929, MRN [id number]  PCP:  Prince Solian, MD   History of Present Illness: Kimberly Jordan is a 85 y.o. female former 52 of [place] with hospitalization in March of 2015 with small bowel obstruction and probable stress induced cardiomyopathy with ejection fraction of 20%. Typical morphology for this.   She was taken to the cardiac catheterization lab on an emergent basis because of fear of ST elevation myocardial infarction. She had no obstructive coronary disease.  She was hospitalized once again on 10/14/14 after mechanical fall, ulnar fracture. Her heart failure was well compensated. Her ejection fraction has mildly increased from 20% up to 35%.  I had a prior telephone conversation with Dr. Dagmar Hait regarding her heart condition.  At this time, she appears to be back on track and I agree with his updates on furosemide. No change with carvedilol. Hemoglobin 13.6, potassium 3.9, sodium 139, creatinine 0.8.  No SOB, no CP. No orthopnea. Overall enjoying the things she enjoys doing. Her furosemide was discontinued. She seems to continue to do very well.   Tolerating medications well.  02/27/18 -overall has been doing quite well over the past year.  She had some medication discrepancies, furosemide and Myrbetriq.  She has not been taking both of these medications.  This is fine from my point of view.  She can have the Lasix as needed if weight gain over 3 pounds.  She has not had any syncope in relation to her left bundle branch block.  She is enrolled in a exercise class with [employer].  Doing very well.   Wt Readings from Last 3 Encounters:  02/27/18 109 lb 12.8 oz (49.8 kg)  01/06/18 110 lb (49.9 kg)  03/04/17 107 lb 6.4 oz (48.7 kg)     Past Medical History:  Diagnosis Date  . Blepharitis   . Breast cancer ([place])  1992   right breast   . Ectopic pregnancy   . Hypertension   . Osteoporosis   . 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 CATARACT EXTRACTION  2010, 2011   2010 right eye, 2011 left eye  . COLONOSCOPY W/ POLYPECTOMY    . cyst removed  2012   from right wrist  . ECTOPIC PREGNANCY SURGERY     salpingectomy  . HAND SURGERY  2007   right hand-ruptured tendon  . KNEE ARTHROSCOPY  1998  . LAPAROTOMY N/A 03/22/2014   Procedure: EXPLORATORY LAPAROTOMY;  Surgeon: Merrie Roof, MD;  [facility];  Service: General;  Laterality: N/A;  . LEFT HEART CATHETERIZATION WITH CORONARY ANGIOGRAM N/A 03/25/2014   Procedure: LEFT HEART CATHETERIZATION WITH CORONARY ANGIOGRAM;  Surgeon: Jettie Booze, MD;  Location[facility] CATH LAB;  Service: Cardiovascular;  Laterality: N/A;  . ORIF ELBOW FRACTURE Left 10/15/2014   Procedure: OPEN REDUCTION INTERNAL FIXATION (ORIF) ELBOW/OLECRANON FRACTURE;  Surgeon: Renette Butters, MD;  Location: [facility];  Service: Orthopedics;  Laterality: Left;  stryker elbow plate   . TOTAL ABDOMINAL HYSTERECTOMY W/ BILATERAL SALPINGOOPHORECTOMY  1994    Current Outpatient Medications  Medication Sig Dispense Refill  . aspirin 81 MG tablet Take 81 mg by mouth daily.    . Calcium Carbonate-Vitamin D (CALCIUM + D PO) Take  600 mg by mouth 3 (three) times daily.    . carvedilol (COREG) 12.5 MG tablet TAKE 1 TABLET TWICE DAILY WITH FOOD. 60 tablet 11  . clobetasol cream (TEMOVATE) 5.63 % Apply 1 application topically as needed (For itching on back).     . CREON 24000 units CPEP Take 3 capsules by mouth 3 (three) times daily.    Marland Kitchen doxycycline (VIBRA-TABS) 100 MG tablet Take 100 mg by mouth every evening.     Marland Kitchen OVER THE COUNTER MEDICATION Place 1 application into both eyes as needed. occu scrub    . potassium chloride (K-DUR) 10 MEQ tablet Take 1 tablet (10 mEq total) by mouth daily. 90 tablet 3  .  simvastatin (ZOCOR) 20 MG tablet Take 20 mg by mouth every evening.     Marland Kitchen telmisartan (MICARDIS) 80 MG tablet Take 1 tablet (80 mg total) by mouth daily. 90 tablet 1   No current facility-administered medications for this visit.     Allergies:   No Known Allergies  Social History:  The patient  reports that she has quit smoking. she has never used smokeless tobacco. She reports that she drinks about 3.0 oz of alcohol per week. She reports that she does not use drugs.   Family History  Problem Relation Age of Onset  . Osteoporosis Mother   . Arthritis Mother   . Hypertension Mother   . CVA Father   . Hypertension Father   . Stroke Father   . Migraines Father     ROS:  Please see the history of present illness.  All other ROS neg  PHYSICAL EXAM: VS:  BP 130/72   Pulse 70   Ht 5' (1.524 m)   Wt 109 lb 12.8 oz (49.8 kg)   LMP 12/31/1992   BMI 21.44 kg/m  GEN: Well nourished, well developed, in no acute distress  HEENT: normal  Neck: no JVD, carotid bruits, or masses Cardiac: RRR; 2/6 M, no rubs, or gallops,no edema  Respiratory:  clear to auscultation bilaterally, normal work of breathing GI: soft, nontender, nondistended, + BS MS: no deformity or atrophy  Skin: warm and dry, no rash Neuro:  Alert and Oriented x 3, Strength and sensation are intact Psych: euthymic mood, full affect   EKG:  Today 02/27/18-sinus rhythm 70 left bundle branch block personally viewed-no changes 02/26/17-sinus rhythm 71 with LVH, LBBB pattern and repolarization abnormality. Personally viewed-prior 09/02/15-sinus rhythm, 69, PA-C, left bundle branch block appearance, right atrial enlargement.  Echocardiogram: 07/06/14-ejection fraction 35% up from 25%.  ASSESSMENT AND PLAN:  1. Nonischemic cardiomyopathy - cardiac catheterization reassuring. NYHA 1-2.  Uptitrated carvedilol to 12.5 mg twice a day. I'm comfortable with her being off of furosemide, can take PRN. Continue with angiotensin receptor  blocker. No indication of fluid overload.   2. LBBB - no syncope, doing well.  3. Hypertension-currently well controlled on current medication strategy. 4. She has not been taking Myrbetriq (for over active bladder) doing well.  5. 12 month followup   Signed, Candee Furbish, MD [facility]  02/27/2018 8:54 AM

## 2018-02-27 NOTE — Patient Instructions (Signed)
Medication Instructions:  1. Your physician recommends that you continue on your current medications as directed. Please refer to the Current Medication list given to you today.   Labwork: NONE ORDERED TODAY  Testing/Procedures: NONE ORDERED TODAY  Follow-Up: Your physician wants you to follow-up in: [place] will receive a reminder letter in the mail two months in advance. If you don't receive a letter, please call our office to schedule the follow-up appointment.   Any Other Special Instructions Will Be Listed Below (If Applicable).     If you need a refill on your cardiac medications before your next appointment, please call your pharmacy.

## 2018-03-18 ENCOUNTER — Other Ambulatory Visit: Payer: Self-pay | Admitting: Cardiology

## 2018-05-29 ENCOUNTER — Encounter: Payer: Self-pay | Admitting: Obstetrics & Gynecology

## 2018-05-29 ENCOUNTER — Ambulatory Visit: Payer: Medicare Other | Admitting: Obstetrics & Gynecology

## 2018-05-29 ENCOUNTER — Other Ambulatory Visit: Payer: Self-pay

## 2018-05-29 VITALS — BP 152/80 | HR 80 | Resp 16 | Ht 59.5 in | Wt 105.8 lb

## 2018-05-29 DIAGNOSIS — Z01419 Encounter for gynecological examination (general) (routine) without abnormal findings: Secondary | ICD-10-CM

## 2018-05-29 NOTE — Progress Notes (Signed)
84 y.o. [id number] MarriedCaucasianF here for annual exam.  Doing well.  Husband passed a year ago.  This has been hard.  Was in a nursing home for about two years.  He was at [facility] and was 24 when he died.    She had months dealing with estate work.  Coping "ok" but has family and friends who are supportive.  Denies vaginal bleeding.    Having some issues with dust and allergies/eyes.  PCP:  Dr. Dagmar Hait.  Typically is seen every six month.  Last appt was just recently for blood pressure check.    Sees cardiologist, Dr. Marlou Porch, yearly.   Patient's last menstrual period was 12/31/1992.          Sexually active: No.  The current method of family planning is status post hysterectomy.    Exercising: Yes.    [employer], recumbent cross, trainer Smoker:  no  Health Maintenance: Pap:  04/2007 Neg  History of abnormal Pap:  no MMG:  03/13/17 BIRADS2:benign  Colonoscopy:  2005 Normal.  BMD:   2014 Dr. Dagmar Hait  TDaP: PCP Pneumonia vaccine(s):  Done with PCP Shingrix: done with PCP Hep C testing: n/a Screening Labs: PCP   reports that she has quit smoking. She has never used smokeless tobacco. She reports that she drinks about 3.0 oz of alcohol per week. She reports that she does not use drugs.  Past Medical History:  Diagnosis Date  . Blepharitis   . Breast cancer ([place]) 1992   right breast   . Ectopic pregnancy   . Hypertension   . Osteoporosis   . 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 CATARACT EXTRACTION  2010, 2011   2010 right eye, 2011 left eye  . COLONOSCOPY W/ POLYPECTOMY    . cyst removed  2012   from right wrist  . ECTOPIC PREGNANCY SURGERY     salpingectomy  . HAND SURGERY  2007   right hand-ruptured tendon  . KNEE ARTHROSCOPY  1998  . LAPAROTOMY N/A 03/22/2014   Procedure: EXPLORATORY LAPAROTOMY;  Surgeon: Merrie Roof, MD;  Location: [facility];  Service: General;  Laterality: N/A;  .  LEFT HEART CATHETERIZATION WITH CORONARY ANGIOGRAM N/A 03/25/2014   Procedure: LEFT HEART CATHETERIZATION WITH CORONARY ANGIOGRAM;  Surgeon: Jettie Booze, MD;  Location: [facility] CATH LAB;  Service: Cardiovascular;  Laterality: N/A;  . ORIF ELBOW FRACTURE Left 10/15/2014   Procedure: OPEN REDUCTION INTERNAL FIXATION (ORIF) ELBOW/OLECRANON FRACTURE;  Surgeon: Renette Butters, MD;  Location: [place];  Service: Orthopedics;  Laterality: Left;  stryker elbow plate   . TOTAL ABDOMINAL HYSTERECTOMY W/ BILATERAL SALPINGOOPHORECTOMY  1994    Current Outpatient Medications  Medication Sig Dispense Refill  . aspirin 81 MG tablet Take 81 mg by mouth daily.    . Calcium Carbonate-Vitamin D (CALCIUM + D PO) Take 600 mg by mouth 3 (three) times daily.    . carvedilol (COREG) 12.5 MG tablet TAKE 1 TABLET TWICE DAILY WITH FOOD. 60 tablet 11  . clobetasol cream (TEMOVATE) 0.10 % Apply 1 application topically as needed (For itching on back).     . CREON 24000 units CPEP Take 3 capsules by mouth 3 (three) times daily.    Marland Kitchen doxycycline (VIBRA-TABS) 100 MG tablet Take 100 mg by mouth every evening.     Marland Kitchen erythromycin ophthalmic ointment     . furosemide (LASIX) 20 MG tablet Take  20 mg by mouth daily.    Marland Kitchen OVER THE COUNTER MEDICATION Place 1 application into both eyes as needed. occu scrub    . potassium chloride (K-DUR) 10 MEQ tablet Take 1 tablet (10 mEq total) by mouth daily. 90 tablet 3  . simvastatin (ZOCOR) 20 MG tablet Take 20 mg by mouth every evening.     Marland Kitchen telmisartan (MICARDIS) 80 MG tablet Take 1 tablet (80 mg total) by mouth daily. 90 tablet 1  . zoledronic acid (RECLAST) 5 MG/100ML SOLN injection Inject 5 mg into the vein once.     No current facility-administered medications for this visit.     Family History  Problem Relation Age of Onset  . Osteoporosis Mother   . Arthritis Mother   . Hypertension Mother   . CVA Father   . Hypertension Father   . Stroke Father   . Migraines Father      Review of Systems  All other systems reviewed and are negative.   Exam:   BP (!) 152/80 (BP Location: Left Arm, Patient Position: Sitting, Cuff Size: Normal)   Pulse 80   Resp 16   Ht 4' 11.5" (1.511 m)   Wt 105 lb 12.8 oz (48 kg)   LMP 12/31/1992   BMI 21.01 kg/m     Height: 4' 11.5" (151.1 cm)  Ht Readings from Last 3 Encounters:  05/29/18 4' 11.5" (1.511 m)  02/27/18 5' (1.524 m)  01/06/18 5' (1.524 m)    General appearance: alert, cooperative and appears stated age Head: Normocephalic, without obvious abnormality, atraumatic Neck: no adenopathy, supple, symmetrical, trachea midline and thyroid normal to inspection and palpation Lungs: clear to auscultation bilaterally Breasts: right breast with 2cm firmness around incision, unchanged, no other masses, skin changes, LAD, nipple discharge on either breast Heart: regular rate and rhythm Abdomen: soft, non-tender; bowel sounds normal; no masses,  no organomegaly Extremities: extremities normal, atraumatic, no cyanosis or edema Skin: Skin color, texture, turgor normal. No rashes or lesions Lymph nodes: Cervical, supraclavicular, and axillary nodes normal. No abnormal inguinal nodes palpated Neurologic: Grossly normal   Pelvic: External genitalia:  no lesions              Urethra:  normal appearing urethra with no masses, tenderness or lesions              Bartholins and Skenes: normal                 Vagina: normal appearing vagina with normal color and discharge, no lesions              Cervix: absent              Pap taken: No. Bimanual Exam:  Uterus:  uterus absent              Adnexa: no mass, fullness, tenderness               Rectovaginal: Confirms               Anus:  normal sphincter tone, no lesions  Chaperone was present for exam.  A:  Well Woman with normal exam PMP, no HRT H/O breast cancer 2 H/O MI with subsequent cardiomyopathy.  Followed by Dr. Marlou Porch.   Hypertension Elevated lipids  P:    Mammogram guidelines reviewed.  Desires to continue with yearly MMGs pap smear not indicated Lab work done with Dr. Dagmar Hait Vaccines are UTD.  States she has received Shingrix. Pt desires  to continue yearly exam.  Follow up one year.

## 2019-01-29 ENCOUNTER — Other Ambulatory Visit (HOSPITAL_COMMUNITY): Payer: Self-pay | Admitting: *Deleted

## 2019-01-30 ENCOUNTER — Ambulatory Visit (HOSPITAL_COMMUNITY)
Admission: RE | Admit: 2019-01-30 | Discharge: 2019-01-30 | Disposition: A | Discharge status: Home / Self Care | Payer: Medicare Other | Source: Ambulatory Visit | Attending: Internal Medicine | Admitting: Internal Medicine

## 2019-01-30 DIAGNOSIS — M810 Age-related osteoporosis without current pathological fracture: Secondary | ICD-10-CM | POA: Diagnosis not present

## 2019-01-30 MED ORDER — ZOLEDRONIC ACID 5 MG/100ML IV SOLN
5.0000 mg | Freq: Once | INTRAVENOUS | Status: AC
  Administered 2019-01-30: 5 mg via INTRAVENOUS

## 2019-01-30 MED ORDER — ZOLEDRONIC ACID 5 MG/100ML IV SOLN
INTRAVENOUS | Status: AC
  Administered 2019-01-30: 5 mg via INTRAVENOUS
  Filled 2019-01-30: qty 100

## 2019-02-18 ENCOUNTER — Ambulatory Visit: Payer: Medicare Other | Admitting: Cardiology

## 2019-02-18 ENCOUNTER — Encounter: Payer: Self-pay | Admitting: Cardiology

## 2019-02-18 VITALS — BP 160/82 | HR 64 | Ht 60.0 in | Wt 108.4 lb

## 2019-02-18 DIAGNOSIS — I447 Left bundle-branch block, unspecified: Secondary | ICD-10-CM

## 2019-02-18 DIAGNOSIS — I10 Essential (primary) hypertension: Secondary | ICD-10-CM

## 2019-02-18 DIAGNOSIS — I428 Other cardiomyopathies: Secondary | ICD-10-CM

## 2019-02-18 NOTE — Progress Notes (Signed)
[place]. [address], [address] [place], [place]  [address] Phone: [phone] Fax:  [phone]  Date:  02/18/2019   ID:  Kimberly Jordan, DOB 03-30-1929, MRN [id number]  PCP:  Prince Solian, MD   History of Present Illness: Kimberly Jordan is a 85 y.o. female former 77 of [place] with hospitalization in March of 2015 with small bowel obstruction and probable stress induced cardiomyopathy with ejection fraction of 20%. Typical morphology for this.   She was taken to the cardiac catheterization lab on an emergent basis because of fear of ST elevation myocardial infarction. She had no obstructive coronary disease.  She was hospitalized once again on 10/14/14 after mechanical fall, ulnar fracture. Her heart failure was well compensated. Her ejection fraction has mildly increased from 20% up to 35%.  I had a prior telephone conversation with Dr. Dagmar Hait regarding her heart condition.  At this time, she appears to be back on track and I agree with his updates on furosemide. No change with carvedilol. Hemoglobin 13.6, potassium 3.9, sodium 139, creatinine 0.8.  No SOB, no CP. No orthopnea. Overall enjoying the things she enjoys doing. Her furosemide was discontinued. She seems to continue to do very well.   Tolerating medications well.  02/27/18 -overall has been doing quite well over the past year.  She had some medication discrepancies, furosemide and Myrbetriq.  She has not been taking both of these medications.  This is fine from my point of view.  She can have the Lasix as needed if weight gain over 3 pounds.  She has not had any syncope in relation to her left bundle branch block.  She is enrolled in a exercise class with [employer].  Doing very well.  02/18/2019- here for the follow-up of nonischemic cardiomyopathy.  No CAD on cardiac catheterization.  EF up from 20 to 35%.  Doing quite well.  Denies any fevers chills nausea vomiting syncope bleeding.  Trying to stay  active.  Her blood pressure is a little bit elevated today.  Dr. Dagmar Hait has been monitoring.   Wt Readings from Last 3 Encounters:  02/18/19 108 lb 6.4 oz (49.2 kg)  01/30/19 107 lb (48.5 kg)  05/29/18 105 lb 12.8 oz (48 kg)     Past Medical History:  Diagnosis Date  . Blepharitis   . Breast cancer ([place]) 1992   right breast   . Ectopic pregnancy   . Hypertension   . Osteoporosis   . 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 CATARACT EXTRACTION  2010, 2011   2010 right eye, 2011 left eye  . COLONOSCOPY W/ POLYPECTOMY    . cyst removed  2012   from right wrist  . ECTOPIC PREGNANCY SURGERY     salpingectomy  . HAND SURGERY  2007   right hand-ruptured tendon  . KNEE ARTHROSCOPY  1998  . LAPAROTOMY N/A 03/22/2014   Procedure: EXPLORATORY LAPAROTOMY;  Surgeon: Merrie Roof, MD;  [facility]: [facility];  Service: General;  Laterality: N/A;  . LEFT HEART CATHETERIZATION WITH CORONARY ANGIOGRAM N/A 03/25/2014   Procedure: LEFT HEART CATHETERIZATION WITH CORONARY ANGIOGRAM;  Surgeon: Jettie Booze, MD;  Location: [facility] CATH LAB;  Service: Cardiovascular;  Laterality: N/A;  . ORIF ELBOW FRACTURE Left 10/15/2014   Procedure: OPEN REDUCTION INTERNAL FIXATION (ORIF) ELBOW/OLECRANON FRACTURE;  Surgeon: Renette Butters, MD;  Location: [place];  Service: Orthopedics;  Laterality: Left;  stryker elbow plate   . TOTAL ABDOMINAL HYSTERECTOMY W/ BILATERAL SALPINGOOPHORECTOMY  1994    Current Outpatient Medications  Medication Sig Dispense Refill  . aspirin 81 MG tablet Take 81 mg by mouth daily.    . Calcium Carbonate-Vitamin D (CALCIUM + D PO) Take 600 mg by mouth 3 (three) times daily.    . carvedilol (COREG) 12.5 MG tablet TAKE 1 TABLET TWICE DAILY WITH FOOD. 60 tablet 11  . clobetasol cream (TEMOVATE) 8.41 % Apply 1 application topically as needed (For itching on back).     . CREON 24000 units CPEP Take 3  capsules by mouth 3 (three) times daily.    Marland Kitchen doxycycline (VIBRA-TABS) 100 MG tablet Take 100 mg by mouth every evening.     Marland Kitchen erythromycin ophthalmic ointment     . OVER THE COUNTER MEDICATION Place 1 application into both eyes as needed. occu scrub    . potassium chloride (K-DUR) 10 MEQ tablet Take 1 tablet (10 mEq total) by mouth daily. 90 tablet 3  . simvastatin (ZOCOR) 20 MG tablet Take 20 mg by mouth every evening.     Marland Kitchen telmisartan (MICARDIS) 80 MG tablet Take 1 tablet (80 mg total) by mouth daily. 90 tablet 1  . zoledronic acid (RECLAST) 5 MG/100ML SOLN injection Inject 5 mg into the vein once.     No current facility-administered medications for this visit.     Allergies:   No Known Allergies  Social History:  The patient  reports that she has quit smoking. She has never used smokeless tobacco. She reports current alcohol use of about 5.0 standard drinks of alcohol per week. She reports that she does not use drugs.   Family History  Problem Relation Age of Onset  . Osteoporosis Mother   . Arthritis Mother   . Hypertension Mother   . CVA Father   . Hypertension Father   . Stroke Father   . Migraines Father     ROS:  Please see the history of present illness.    PHYSICAL EXAM: VS:  BP (!) 160/82   Pulse 64   Ht 5' (1.524 m)   Wt 108 lb 6.4 oz (49.2 kg)   LMP 12/31/1992   SpO2 96%   BMI 21.17 kg/m  GEN: Well nourished, well developed, in no acute distress  HEENT: normal  Neck: no JVD, carotid bruits, or masses Cardiac: RRR; 2/6 SM, no rubs, or gallops,no edema  Respiratory:  clear to auscultation bilaterally, normal work of breathing GI: soft, nontender, nondistended, + BS MS: no deformity or atrophy  Skin: warm and dry, no rash Neuro:  Alert and Oriented x 3, Strength and sensation are intact Psych: euthymic mood, full affect    EKG:  Today 02/18/2019-sinus rhythm 64 left bundle branch block no other changes personally reviewed and interpreted.  02/27/18-sinus  rhythm 70 left bundle branch block personally viewed-no changes 02/26/17-sinus rhythm 71 with LVH, LBBB pattern and repolarization abnormality. Personally viewed-prior 09/02/15-sinus rhythm, 69, PA-C, left bundle branch block appearance, right atrial enlargement.  Echocardiogram: 07/06/14-ejection fraction 35% up from 25%.  ASSESSMENT AND PLAN:  1. Nonischemic cardiomyopathy - cardiac catheterization reassuring, no CAD. NYHA 1-2.  Uptitrated carvedilol to 12.5 mg twice a day. I'm comfortable with her being off of furosemide, can take PRN. Continue with angiotensin receptor blocker. No indication of fluid overload.  Overall doing quite well.  No changes made.  Continue with activity.  [facility]. 2. LBBB - no syncope.  No changes noted.  Doing well. 3. Hypertension-mildly elevated today.  Dr. Dagmar Hait has been monitoring..  4. 12 month followup   Signed, Candee Furbish, MD [facility]  02/18/2019 3:39 PM

## 2019-02-18 NOTE — Patient Instructions (Signed)
Medication Instructions:  The current medical regimen is effective;  continue present plan and medications.  If you need a refill on your cardiac medications before your next appointment, please call your pharmacy.   Follow-Up: At [facility], you and your health needs are our priority.  As part of our continuing mission to provide you with exceptional heart care, we have created designated Provider Care Teams.  These Care Teams include your primary Cardiologist (physician) and Advanced Practice Providers (APPs -  Physician Assistants and Nurse Practitioners) who all work together to provide you with the care you need, when you need it. You will need a follow up appointment in 12 months.  Please call our office 2 months in advance to schedule this appointment.  You may see Mark Skains, MD or one of the following Advanced Practice Providers on your designated Care Team:   Lori Gerhardt, NP Laura Ingold, NP . Jill McDaniel, NP  Thank you for choosing [facility]!!      

## 2019-03-16 ENCOUNTER — Other Ambulatory Visit: Payer: Self-pay | Admitting: Cardiology

## 2019-08-03 ENCOUNTER — Other Ambulatory Visit: Payer: Self-pay | Admitting: Internal Medicine

## 2019-08-03 DIAGNOSIS — R911 Solitary pulmonary nodule: Secondary | ICD-10-CM

## 2019-08-12 ENCOUNTER — Ambulatory Visit
Admission: RE | Admit: 2019-08-12 | Discharge: 2019-08-12 | Disposition: A | Discharge status: Home / Self Care | Payer: Medicare Other | Source: Ambulatory Visit | Attending: Internal Medicine | Admitting: Internal Medicine

## 2019-08-12 DIAGNOSIS — R911 Solitary pulmonary nodule: Secondary | ICD-10-CM

## 2019-08-18 ENCOUNTER — Other Ambulatory Visit: Payer: Self-pay | Admitting: Internal Medicine

## 2019-08-18 DIAGNOSIS — R911 Solitary pulmonary nodule: Secondary | ICD-10-CM

## 2019-08-31 ENCOUNTER — Other Ambulatory Visit: Payer: Self-pay | Admitting: Internal Medicine

## 2019-08-31 ENCOUNTER — Ambulatory Visit
Admission: RE | Admit: 2019-08-31 | Discharge: 2019-08-31 | Disposition: A | Discharge status: Home / Self Care | Payer: Medicare Other | Source: Ambulatory Visit | Attending: Internal Medicine | Admitting: Internal Medicine

## 2019-08-31 DIAGNOSIS — R911 Solitary pulmonary nodule: Secondary | ICD-10-CM

## 2019-09-04 ENCOUNTER — Ambulatory Visit (INDEPENDENT_AMBULATORY_CARE_PROVIDER_SITE_OTHER): Payer: Medicare Other | Admitting: Obstetrics & Gynecology

## 2019-09-04 ENCOUNTER — Other Ambulatory Visit: Payer: Self-pay

## 2019-09-04 ENCOUNTER — Encounter: Payer: Self-pay | Admitting: Obstetrics & Gynecology

## 2019-09-04 VITALS — BP 102/60 | HR 64 | Temp 97.8°F | Ht 60.0 in | Wt 106.0 lb

## 2019-09-04 DIAGNOSIS — Z01419 Encounter for gynecological examination (general) (routine) without abnormal findings: Secondary | ICD-10-CM

## 2019-09-04 NOTE — Progress Notes (Addendum)
85 y.o. [id number] Married White or Caucasian female here for annual exam.  Has incisional and inguinal hernias that seem larger to her this year.  Would like me to look at this.  Had small bowel obstruction and midline incision in 2015.    Denies vaginal bleeding.    PCP:  Dr. Dagmar Hait.  Had virtual visit.  Had blood work via drive up.    Patient's last menstrual period was 12/31/1992.          Sexually active: No.  The current method of family planning is status post hysterectomy.    Exercising: Yes.     Smoker:  no  Health Maintenance: Pap:  2008 neg  History of abnormal Pap:  no MMG:  03/13/17 BIRADS2:Benign.  She has decided to stop doing these.   Colonoscopy:  2005 normal BMD:   2014 Dr. Dagmar Hait TDaP:  PCP Pneumonia vaccine(s):  PCP Shingrix:   PCP Hep C testing: n/a Screening Labs: PCP   reports that she has quit smoking. She has never used smokeless tobacco. She reports current alcohol use of about 5.0 standard drinks of alcohol per week. She reports that she does not use drugs.  Past Medical History:  Diagnosis Date  . Blepharitis   . Breast cancer ([place]) 1992   right breast   . Ectopic pregnancy   . Hypertension   . Osteoporosis   . 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 CATARACT EXTRACTION  2010, 2011   2010 right eye, 2011 left eye  . COLONOSCOPY W/ POLYPECTOMY    . cyst removed  2012   from right wrist  . ECTOPIC PREGNANCY SURGERY     salpingectomy  . HAND SURGERY  2007   right hand-ruptured tendon  . KNEE ARTHROSCOPY  1998  . LAPAROTOMY N/A 03/22/2014   Procedure: EXPLORATORY LAPAROTOMY;  Surgeon: Merrie Roof, MD;  [facility]: [facility];  Service: General;  Laterality: N/A;  . LEFT HEART CATHETERIZATION WITH CORONARY ANGIOGRAM N/A 03/25/2014   Procedure: LEFT HEART CATHETERIZATION WITH CORONARY ANGIOGRAM;  Surgeon: Jettie Booze, MD;  Location: [facility] CATH LAB;  Service:  Cardiovascular;  Laterality: N/A;  . ORIF ELBOW FRACTURE Left 10/15/2014   Procedure: OPEN REDUCTION INTERNAL FIXATION (ORIF) ELBOW/OLECRANON FRACTURE;  Surgeon: Renette Butters, MD;  Location: [place];  Service: Orthopedics;  Laterality: Left;  stryker elbow plate   . TOTAL ABDOMINAL HYSTERECTOMY W/ BILATERAL SALPINGOOPHORECTOMY  1994    Current Outpatient Medications  Medication Sig Dispense Refill  . amLODipine (NORVASC) 10 MG tablet Take 1 tablet by mouth daily.    Marland Kitchen aspirin 81 MG tablet Take 81 mg by mouth daily.    . Calcium Carbonate-Vitamin D (CALCIUM + D PO) Take 600 mg by mouth 3 (three) times daily.    . carvedilol (COREG) 12.5 MG tablet TAKE 1 TABLET TWICE DAILY WITH FOOD. 180 tablet 1  . clobetasol cream (TEMOVATE) [id number] % Apply 1 application topically as needed (For itching on back).     . CREON 24000 units CPEP Take 3 capsules by mouth 3 (three) times daily.    Marland Kitchen doxycycline (VIBRA-TABS) 100 MG tablet Take 100 mg by mouth every evening.     Marland Kitchen erythromycin ophthalmic ointment     . OVER THE COUNTER MEDICATION Place 1 application into both eyes as needed. occu scrub    . simvastatin (ZOCOR) 20 MG tablet Take 20 mg by  mouth every evening.     Marland Kitchen telmisartan (MICARDIS) 80 MG tablet Take 1 tablet (80 mg total) by mouth daily. 90 tablet 1  . zoledronic acid (RECLAST) 5 MG/100ML SOLN injection Inject 5 mg into the vein once.     No current facility-administered medications for this visit.     Family History  Problem Relation Age of Onset  . Osteoporosis Mother   . Arthritis Mother   . Hypertension Mother   . CVA Father   . Hypertension Father   . Stroke Father   . Migraines Father     Review of Systems  All other systems reviewed and are negative.   Exam:   BP 102/60   Pulse 64   Temp 97.8 F (36.6 C) (Temporal)   Ht 5' (1.524 m)   Wt 106 lb (48.1 kg)   LMP 12/31/1992   BMI 20.70 kg/m   Height: 5' (152.4 cm)  Ht Readings from Last 3 Encounters:  09/04/19 5'  (1.524 m)  02/18/19 5' (1.524 m)  01/30/19 5' (1.524 m)    General appearance: alert, cooperative and appears stated age Head: Normocephalic, without obvious abnormality, atraumatic Neck: no adenopathy, supple, symmetrical, trachea midline and thyroid normal to inspection and palpation Lungs: clear to auscultation bilaterally Breasts: right scar firmness that has been present for several years, no change; left breast without masses, skin changes, LAD, nipple discharge; no right axillary LAD or nippled discharge. Heart: regular rate and rhythm Abdomen: soft, non-tender; bowel sounds normal; no masses,  no organomegaly Extremities: extremities normal, atraumatic, no cyanosis or edema Skin: Skin color, texture, turgor normal. No rashes or lesions Lymph nodes: Cervical, supraclavicular, and axillary nodes normal. No abnormal inguinal nodes palpated Neurologic: Grossly normal   Pelvic: External genitalia:  no lesions              Urethra:  normal appearing urethra with no masses, tenderness or lesions              Bartholins and Skenes: normal                 Vagina: normal appearing vagina with normal color and discharge, no lesions              Cervix: no lesions              Pap taken: No. Bimanual Exam:  Uterus:  normal size, contour, position, consistency, mobility, non-tender              Adnexa: normal adnexa and no mass, fullness, tenderness               Rectovaginal: Confirms               Anus:  normal sphincter tone, no lesions  Chaperone was present for exam.  A:  Well Woman with normal exam PMP, no HRT H/o breast cancer 1992 H/o MI now with cardiomyopathy.  Followed by Dr. Marlou Porch. Hypertension Elevated lipids 1.2cm x 1.0cm lung nodule that has been stable for five years.  12 month follow up recommended.  P:   Mammogram guidelines reviewed.  She has decided to stop doing these at this time pap smear not indicated Lab work was done earlier this year with Dr.  Dagmar Hait. Vaccines are UTD. Return annually or prn

## 2019-09-13 ENCOUNTER — Other Ambulatory Visit: Payer: Self-pay | Admitting: Cardiology

## 2019-12-15 ENCOUNTER — Other Ambulatory Visit: Payer: Self-pay | Admitting: Cardiology

## 2020-01-14 ENCOUNTER — Encounter (HOSPITAL_COMMUNITY): Payer: Medicare Other

## 2020-01-25 ENCOUNTER — Other Ambulatory Visit: Payer: Self-pay | Admitting: Cardiology

## 2020-01-28 ENCOUNTER — Other Ambulatory Visit (HOSPITAL_COMMUNITY): Payer: Self-pay

## 2020-01-29 ENCOUNTER — Ambulatory Visit (HOSPITAL_COMMUNITY)
Admission: RE | Admit: 2020-01-29 | Discharge: 2020-01-29 | Disposition: A | Discharge status: Home / Self Care | Payer: Medicare PPO | Source: Ambulatory Visit | Attending: Internal Medicine | Admitting: Internal Medicine

## 2020-01-29 ENCOUNTER — Other Ambulatory Visit: Payer: Self-pay

## 2020-01-29 DIAGNOSIS — M810 Age-related osteoporosis without current pathological fracture: Secondary | ICD-10-CM | POA: Diagnosis not present

## 2020-01-29 MED ORDER — ZOLEDRONIC ACID 5 MG/100ML IV SOLN
5.0000 mg | Freq: Once | INTRAVENOUS | Status: AC
  Administered 2020-01-29: 5 mg via INTRAVENOUS

## 2020-01-29 MED ORDER — ZOLEDRONIC ACID 5 MG/100ML IV SOLN
INTRAVENOUS | Status: AC
  Filled 2020-01-29: qty 100

## 2020-02-05 ENCOUNTER — Ambulatory Visit: Payer: Medicare PPO | Attending: Internal Medicine

## 2020-02-05 DIAGNOSIS — Z23 Encounter for immunization: Secondary | ICD-10-CM

## 2020-02-05 NOTE — Progress Notes (Signed)
   Covid-19 Vaccination Clinic  Name:  Kimberly Jordan    MRN: [id number] DOB: Apr 28, 1929  02/05/2020  Kimberly Jordan was observed post Covid-19 immunization for 15 minutes without incidence. She was provided with Vaccine Information Sheet and instruction to access the V-Safe system.   Kimberly Jordan was instructed to call 911 with any severe reactions post vaccine: Marland Kitchen Difficulty breathing  . Swelling of your face and throat  . A fast heartbeat  . A bad rash all over your body  . Dizziness and weakness    Immunizations Administered    Name Date Dose VIS Date Route   Pfizer COVID-19 Vaccine 02/05/2020  3:16 PM 0.3 mL 12/11/2019 Intramuscular   Manufacturer: [place]   Lot: [id number]   [place]: [id number]

## 2020-02-18 ENCOUNTER — Telehealth (INDEPENDENT_AMBULATORY_CARE_PROVIDER_SITE_OTHER): Payer: Medicare PPO | Admitting: Cardiology

## 2020-02-18 ENCOUNTER — Encounter: Payer: Self-pay | Admitting: Cardiology

## 2020-02-18 ENCOUNTER — Other Ambulatory Visit: Payer: Self-pay

## 2020-02-18 VITALS — BP 155/91 | HR 69 | Ht 60.0 in | Wt 102.0 lb

## 2020-02-18 DIAGNOSIS — I428 Other cardiomyopathies: Secondary | ICD-10-CM | POA: Diagnosis not present

## 2020-02-18 DIAGNOSIS — I10 Essential (primary) hypertension: Secondary | ICD-10-CM

## 2020-02-18 DIAGNOSIS — I447 Left bundle-branch block, unspecified: Secondary | ICD-10-CM

## 2020-02-18 DIAGNOSIS — I420 Dilated cardiomyopathy: Secondary | ICD-10-CM

## 2020-02-18 NOTE — Progress Notes (Signed)
Virtual Visit via Telephone Note   This visit type was conducted due to national recommendations for restrictions regarding the COVID-19 Pandemic (e.g. social distancing) in an effort to limit this patient's exposure and mitigate transmission in our community.  Due to her co-morbid illnesses, this patient is at least at moderate risk for complications without adequate follow up.  This format is felt to be most appropriate for this patient at this time.  The patient did not have access to video technology/had technical difficulties with video requiring transitioning to audio format only (telephone).  All issues noted in this document were discussed and addressed.  No physical exam could be performed with this format.  Please refer to the patient's chart for her  consent to telehealth for [facility].   Date:  02/18/2020   ID:  Kimberly Jordan, DOB 21-Mar-1929, MRN [id number]  Patient Location: Home Provider Location: Home  PCP:  Prince Solian, MD  Cardiologist:  Candee Furbish, MD  Electrophysiologist:  None   Evaluation Performed:  Follow-Up Visit  Chief Complaint: Nonischemic cardiomyopathy follow-up  History of Present Illness:    Kimberly Jordan is a 86 y.o. female former [place] here for follow-up of prior cardiomyopathy EF ranging from 20 to 35% noticed during hospitalization in 2015 secondary to small bowel obstruction.  Looks like stress induced cardiomyopathy at the time.  She was taken to the cardiac catheterization lab for fear of ST elevation MI but had no obstructive coronary disease.  Lasix was eventually converted to as needed.  Has left bundle branch block but no high risk symptoms such as syncope.  The patient does not have symptoms concerning for COVID-19 infection (fever, chills, cough, or new shortness of breath).    Past Medical History:  Diagnosis Date  . Blepharitis   . Breast cancer ([place]) 1992   right breast   . Ectopic pregnancy   .  Hypertension   . Osteoporosis   . 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 CATARACT EXTRACTION  2010, 2011   2010 right eye, 2011 left eye  . COLONOSCOPY W/ POLYPECTOMY    . cyst removed  2012   from right wrist  . ECTOPIC PREGNANCY SURGERY     salpingectomy  . HAND SURGERY  2007   right hand-ruptured tendon  . KNEE ARTHROSCOPY  1998  . LAPAROTOMY N/A 03/22/2014   Procedure: EXPLORATORY LAPAROTOMY;  Surgeon: Merrie Roof, MD;  Location: [facility];  Service: General;  Laterality: N/A;  . LEFT HEART CATHETERIZATION WITH CORONARY ANGIOGRAM N/A 03/25/2014   Procedure: LEFT HEART CATHETERIZATION WITH CORONARY ANGIOGRAM;  Surgeon: Jettie Booze, MD;  Location: [facility];  Service: Cardiovascular;  Laterality: N/A;  . ORIF ELBOW FRACTURE Left 10/15/2014   Procedure: OPEN REDUCTION INTERNAL FIXATION (ORIF) ELBOW/OLECRANON FRACTURE;  Surgeon: Renette Butters, MD;  Location: [place];  Service: Orthopedics;  Laterality: Left;  stryker elbow plate   . TOTAL ABDOMINAL HYSTERECTOMY W/ BILATERAL SALPINGOOPHORECTOMY  1994     Current Meds  Medication Sig  . amLODipine (NORVASC) 10 MG tablet Take 1 tablet by mouth daily.  Marland Kitchen aspirin 81 MG tablet Take 81 mg by mouth daily.  . Calcium Carbonate-Vitamin D (CALCIUM + D PO) Take 600 mg by mouth 3 (three) times daily.  . carvedilol (COREG) 12.5 MG tablet TAKE 1 TABLET TWICE DAILY WITH FOOD.  . clobetasol cream (TEMOVATE) 0.05 %  Apply 1 application topically as needed (For itching on back).   . CREON 24000 units CPEP Take 3 capsules by mouth 3 (three) times daily.  Marland Kitchen doxycycline (VIBRA-TABS) 100 MG tablet Take 100 mg by mouth every evening.   Marland Kitchen erythromycin ophthalmic ointment   . OVER THE COUNTER MEDICATION Place 1 application into both eyes as needed. occu scrub  . simvastatin (ZOCOR) 20 MG tablet Take 20 mg by mouth every evening.   Marland Kitchen telmisartan (MICARDIS) 80 MG  tablet Take 1 tablet (80 mg total) by mouth daily.  . zoledronic acid (RECLAST) 5 MG/100ML SOLN injection Inject 5 mg into the vein once.     Allergies:   Patient has no known allergies.   Social History   Tobacco Use  . Smoking status: Former Research scientist (life sciences)  . Smokeless tobacco: Never Used  Substance Use Topics  . Alcohol use: Yes    Alcohol/week: 5.0 standard drinks    Types: 5 Glasses of wine per week    Comment: wine daily  . Drug use: No     Family Hx: The patient's family history includes Arthritis in her mother; CVA in her father; Hypertension in her father and mother; Migraines in her father; Osteoporosis in her mother; Stroke in her father.  ROS:   Please see the history of present illness.   No syncope no fevers chills.  No chest pain.  No shortness of breath.  No orthopnea. All other systems reviewed and are negative.   Prior CV studies:   The following studies were reviewed today:  Echo 2015-EF 35% up from 20  Labs/Other Tests and Data Reviewed:    EKG:    02/18/2019-sinus rhythm 64 left bundle branch block no other changes personally reviewed and interpreted  Recent Labs: No results found for requested labs within last 8760 hours.   Recent Lipid Panel No results found for: CHOL, TRIG, HDL, CHOLHDL, LDLCALC, LDLDIRECT  Wt Readings from Last 3 Encounters:  02/18/20 102 lb (46.3 kg)  01/29/20 104 lb (47.2 kg)  09/04/19 106 lb (48.1 kg)     Objective:    Vital Signs:  BP (!) 155/91   Pulse 69   Ht 5' (1.524 m)   Wt 102 lb (46.3 kg)   LMP 12/31/1992   BMI 19.92 kg/m    VITAL SIGNS:  reviewed  Able to complete full sentences without difficulty.  Pleasant.  ASSESSMENT & PLAN:    Nonischemic cardiomyopathy, dilated cardiomyopathy -Currently well compensated.  No significant shortness of breath.  Last EF 35% on echocardiogram.  Continue with current regimen.  She may utilize her Lasix on as-needed basis. -On carvedilol.  Continue with ARB.   Activity. -Symptoms have been stable.  No orthopnea.  NYHA class I-II.  I discussed the potential of repeating echocardiogram since it has been over 5 years however we mutually decided to forego since she was stable.  Left bundle branch block -No high risk symptoms such as syncope.  Continue to monitor.  Essential hypertension -Medications reviewed as above.  Mildly elevated today.  Could be situational.  Amlodipine, Micardis or telmisartan, carvedilol.  Dr. Dagmar Hait has been monitoring his well.  COVID-19 Education: The signs and symptoms of COVID-19 were discussed with the patient and how to seek care for testing (follow up with PCP or arrange E-visit).  The importance of social distancing was discussed today.  Time:   Today, I have spent 21 minutes with the patient with telehealth technology discussing the above problems, review of  prior records, echocardiogram.     Medication Adjustments/Labs and Tests Ordered: Current medicines are reviewed at length with the patient today.  Concerns regarding medicines are outlined above.   Tests Ordered: No orders of the defined types were placed in this encounter.   Medication Changes: No orders of the defined types were placed in this encounter.   Follow Up:  In Person in 1 year(s)  Signed, Candee Furbish, MD  02/18/2020 11:11 AM    [place]

## 2020-02-18 NOTE — Patient Instructions (Signed)
Medication Instructions:  The current medical regimen is effective;  continue present plan and medications.  *If you need a refill on your cardiac medications before your next appointment, please call your pharmacy*  Follow-Up: At [facility], you and your health needs are our priority.  As part of our continuing mission to provide you with exceptional heart care, we have created designated Provider Care Teams.  These Care Teams include your primary Cardiologist (physician) and Advanced Practice Providers (APPs -  Physician Assistants and Nurse Practitioners) who all work together to provide you with the care you need, when you need it.  Your next appointment:   12 months  The format for your next appointment:   In person  Provider:   Dr Candee Furbish  Thank you for choosing [facility]!!

## 2020-03-01 ENCOUNTER — Ambulatory Visit: Payer: Medicare PPO | Attending: Internal Medicine

## 2020-03-01 DIAGNOSIS — Z23 Encounter for immunization: Secondary | ICD-10-CM | POA: Insufficient documentation

## 2020-03-01 NOTE — Progress Notes (Signed)
   Covid-19 Vaccination Clinic  Name:  Kimberly Jordan    MRN: [id number] DOB: [birth year]  03/01/2020  [name] was observed post Covid-19 immunization for 15 minutes without incident. She was provided with Vaccine Information Sheet and instruction to access the V-Safe system.   [name] was instructed to call 911 with any severe reactions post vaccine: Marland Kitchen Difficulty breathing  . Swelling of face and throat  . A fast heartbeat  . A bad rash all over body  . Dizziness and weakness   Immunizations Administered    Name Date Dose VIS Date Route   Pfizer COVID-19 Vaccine 03/01/2020  3:12 PM 0.3 mL 12/11/2019 Intramuscular   Manufacturer: Industry   Lot: [id number]   [place]: [id number]

## 2020-03-13 ENCOUNTER — Other Ambulatory Visit: Payer: Self-pay | Admitting: Cardiology

## 2020-03-16 ENCOUNTER — Ambulatory Visit: Payer: Medicare PPO | Admitting: Podiatry

## 2020-03-16 ENCOUNTER — Encounter: Payer: Self-pay | Admitting: Podiatry

## 2020-03-16 ENCOUNTER — Other Ambulatory Visit: Payer: Self-pay

## 2020-03-16 VITALS — Temp 97.3°F

## 2020-03-16 DIAGNOSIS — B351 Tinea unguium: Secondary | ICD-10-CM | POA: Diagnosis not present

## 2020-03-16 DIAGNOSIS — M79674 Pain in right toe(s): Secondary | ICD-10-CM | POA: Diagnosis not present

## 2020-03-16 DIAGNOSIS — L989 Disorder of the skin and subcutaneous tissue, unspecified: Secondary | ICD-10-CM | POA: Diagnosis not present

## 2020-03-16 DIAGNOSIS — M79675 Pain in left toe(s): Secondary | ICD-10-CM

## 2020-03-16 NOTE — Patient Instructions (Signed)

## 2020-03-20 NOTE — Progress Notes (Signed)
    Subjective: Patient is a 86 y.o. female presenting to the office today as a new patient with a chief complaint of painful callus lesion(s) noted to the bilateral feet that have been present for the past few months. Walking and bearing weight increases the pain. She has not had any treatment at home for her symptoms.  Patient also complains of elongated, thickened nails that cause pain while ambulating in shoes. She is unable to trim her own nails. Of note, patent is on anticoagulant therapy. Patient presents today for further treatment and evaluation.  Past Medical History:  Diagnosis Date  . Blepharitis   . Breast cancer ([place]) 1992   right breast   . Ectopic pregnancy   . Hypertension   . Osteoporosis   . 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 Pain on palpation with a central nucleated core noted. Skin is warm, dry and supple bilateral lower extremities. Negative for open lesions or macerations. Nails are tender, long, thickened and dystrophic with subungual debris, consistent with onychomycosis, 1-5 bilateral. No signs of infection noted.  Vascular: Palpable pedal pulses bilaterally. No edema or erythema noted. Capillary refill within normal limits.  Neurological: Epicritic and protective threshold grossly intact bilaterally.   Musculoskeletal Exam: Pain on palpation at the keratotic lesion(s) noted. Range of motion within normal limits bilateral. Muscle strength 5/5 in all groups bilateral.  Assessment: 1. Onychodystrophic nails 1-5 bilateral with hyperkeratosis of nails.  2. Onychomycosis of nail due to dermatophyte bilateral 3. Pre-ulcerative callus lesions to the bilateral feet x 2    Plan of Care:  1. Patient evaluated. 2. Excisional debridement of keratoic lesion(s) using a chisel blade  was performed without incident.  3. Dressed with light dressing. 4. Mechanical debridement of nails 1-5 bilaterally performed using a nail nipper. Filed with dremel without incident.  5. Patient is to return to the clinic in 3 months.   Edrick Kins, DPM [facility]  Dr. Edrick Kins, [address]                                        [place], [place] [address]                Office [phone]  Fax [phone]

## 2020-04-14 DIAGNOSIS — Z85828 Personal history of other malignant neoplasm of skin: Secondary | ICD-10-CM | POA: Diagnosis not present

## 2020-04-14 DIAGNOSIS — L820 Inflamed seborrheic keratosis: Secondary | ICD-10-CM | POA: Diagnosis not present

## 2020-04-14 DIAGNOSIS — D485 Neoplasm of uncertain behavior of skin: Secondary | ICD-10-CM | POA: Diagnosis not present

## 2020-04-14 DIAGNOSIS — L565 Disseminated superficial actinic porokeratosis (DSAP): Secondary | ICD-10-CM | POA: Diagnosis not present

## 2020-04-14 DIAGNOSIS — L821 Other seborrheic keratosis: Secondary | ICD-10-CM | POA: Diagnosis not present

## 2020-04-14 DIAGNOSIS — L570 Actinic keratosis: Secondary | ICD-10-CM | POA: Diagnosis not present

## 2020-06-07 DIAGNOSIS — M810 Age-related osteoporosis without current pathological fracture: Secondary | ICD-10-CM | POA: Diagnosis not present

## 2020-06-07 DIAGNOSIS — Z0000 Encounter for general adult medical examination without abnormal findings: Secondary | ICD-10-CM | POA: Diagnosis not present

## 2020-06-07 DIAGNOSIS — R7301 Impaired fasting glucose: Secondary | ICD-10-CM | POA: Diagnosis not present

## 2020-06-07 DIAGNOSIS — I10 Essential (primary) hypertension: Secondary | ICD-10-CM | POA: Diagnosis not present

## 2020-06-07 DIAGNOSIS — E7849 Other hyperlipidemia: Secondary | ICD-10-CM | POA: Diagnosis not present

## 2020-06-14 DIAGNOSIS — Z1331 Encounter for screening for depression: Secondary | ICD-10-CM | POA: Diagnosis not present

## 2020-06-14 DIAGNOSIS — E785 Hyperlipidemia, unspecified: Secondary | ICD-10-CM | POA: Diagnosis not present

## 2020-06-14 DIAGNOSIS — R82998 Other abnormal findings in urine: Secondary | ICD-10-CM | POA: Diagnosis not present

## 2020-06-14 DIAGNOSIS — K8681 Exocrine pancreatic insufficiency: Secondary | ICD-10-CM | POA: Diagnosis not present

## 2020-06-14 DIAGNOSIS — D692 Other nonthrombocytopenic purpura: Secondary | ICD-10-CM | POA: Diagnosis not present

## 2020-06-14 DIAGNOSIS — Z0000 Encounter for general adult medical examination without abnormal findings: Secondary | ICD-10-CM | POA: Diagnosis not present

## 2020-06-14 DIAGNOSIS — I10 Essential (primary) hypertension: Secondary | ICD-10-CM | POA: Diagnosis not present

## 2020-06-14 DIAGNOSIS — I428 Other cardiomyopathies: Secondary | ICD-10-CM | POA: Diagnosis not present

## 2020-06-14 DIAGNOSIS — M810 Age-related osteoporosis without current pathological fracture: Secondary | ICD-10-CM | POA: Diagnosis not present

## 2020-06-14 DIAGNOSIS — Z853 Personal history of malignant neoplasm of breast: Secondary | ICD-10-CM | POA: Diagnosis not present

## 2020-06-14 DIAGNOSIS — Z1212 Encounter for screening for malignant neoplasm of rectum: Secondary | ICD-10-CM | POA: Diagnosis not present

## 2020-06-14 DIAGNOSIS — R7301 Impaired fasting glucose: Secondary | ICD-10-CM | POA: Diagnosis not present

## 2020-06-17 ENCOUNTER — Other Ambulatory Visit: Payer: Self-pay

## 2020-06-17 ENCOUNTER — Ambulatory Visit: Payer: Medicare PPO | Admitting: Podiatry

## 2020-06-17 ENCOUNTER — Encounter: Payer: Self-pay | Admitting: Podiatry

## 2020-06-17 VITALS — Temp 96.4°F

## 2020-06-17 DIAGNOSIS — M79674 Pain in right toe(s): Secondary | ICD-10-CM

## 2020-06-17 DIAGNOSIS — M79675 Pain in left toe(s): Secondary | ICD-10-CM | POA: Diagnosis not present

## 2020-06-17 DIAGNOSIS — B351 Tinea unguium: Secondary | ICD-10-CM | POA: Diagnosis not present

## 2020-06-17 DIAGNOSIS — L600 Ingrowing nail: Secondary | ICD-10-CM

## 2020-06-25 NOTE — Progress Notes (Addendum)
Subjective: Kimberly Jordan is a pleasant 86 y.o. female patient seen today painful mycotic nails b/l that are difficult to trim. Pain interferes with ambulation. Aggravating factors include wearing enclosed shoe gear. Pain is relieved with periodic professional debridement.   She voices no new pedal problems on today's visit.  Past Medical History:  Diagnosis Date  . Blepharitis   . Breast cancer ([place]) 1992   right breast   . Ectopic pregnancy   . Hypertension   . Osteoporosis   . 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 Protein-calorie malnutrition, severe ([place]) 09/02/2015  . Essential hypertension 09/02/2015  . Open arm fracture 10/14/2014  . NICM (nonischemic cardiomyopathy) ([place]) 09/15/2014  . Primary localized osteoarthrosis, lower leg 06/15/2014  . Malnutrition of moderate degree ([place]) 03/26/2014  . Acute anterior myocardial infarction ([place]) 03/25/2014  . Acute combined systolic and diastolic heart failure ([place]) 03/25/2014  . Congestive dilated cardiomyopathy ([place]) 03/25/2014  . Acute respiratory failure with hypoxia ([place]) 03/25/2014  . SBO (small bowel obstruction) ([place]) 03/19/2014    Current Outpatient Medications on File Prior to Visit  Medication Sig Dispense Refill  . amLODipine (NORVASC) 10 MG tablet Take 1 tablet by mouth daily.    Marland Kitchen aspirin 81 MG tablet Take 81 mg by mouth daily.    . Calcium Carbonate-Vitamin D (CALCIUM + D PO) Take 600 mg by mouth 3 (three) times daily.    . carvedilol (COREG) 12.5 MG tablet TAKE 1 TABLET TWICE DAILY WITH FOOD. 180 tablet 3  . clobetasol cream (TEMOVATE) 8.34 % Apply 1 application topically as needed (For itching on back).     . CREON 24000 units CPEP Take 3 capsules by mouth 3 (three) times daily.    Marland Kitchen erythromycin ophthalmic ointment     . MYRBETRIQ 50 MG TB24 tablet     . OVER THE COUNTER MEDICATION Place 1 application into both eyes as  needed. occu scrub    . simvastatin (ZOCOR) 20 MG tablet Take 20 mg by mouth every evening.     Marland Kitchen telmisartan (MICARDIS) 80 MG tablet Take 1 tablet (80 mg total) by mouth daily. 90 tablet 1  . zoledronic acid (RECLAST) 5 MG/100ML SOLN injection Inject 5 mg into the vein once.    . doxycycline (VIBRA-TABS) 100 MG tablet Take 100 mg by mouth every evening.  (Patient not taking: Reported on 06/17/2020)     No current facility-administered medications on file prior to visit.    No Known Allergies  Objective: Physical Exam  General: Kimberly Jordan is a pleasant 86 y.o. Caucasian female, in NAD. AAO x 3.   Vascular:  Neurovascular status unchanged b/l lower extremities. Capillary refill time to digits immediate b/l. Palpable pedal pulses b/l LE. Pedal hair sparse. Lower extremity skin temperature gradient within normal limits. No pain with calf compression b/l.  Dermatological:  Pedal skin with normal turgor, texture and tone bilaterally. No open wounds bilaterally. No interdigital macerations bilaterally. Toenails 1-5 b/l elongated, discolored, dystrophic, thickened, crumbly with subungual debris and tenderness to dorsal palpation. Incurvated nailplate lateral border(s) L hallux and R hallux.  Nail border hypertrophy absent. There is tenderness to palpation. Sign(s) of infection: no clinical signs of infection noted on examination today. No hyperkeratotic nor porokeratotic lesions present on today's visit.  Musculoskeletal:  Normal muscle strength 5/5 to all lower extremity muscle groups bilaterally. No pain crepitus or joint limitation  noted with ROM b/l. No gross bony deformities bilaterally.  Neurological:  Protective sensation intact 5/5 intact bilaterally with 10g monofilament b/l. Vibratory sensation intact b/l. Proprioception intact bilaterally. Babinski reflex negative b/l.  Assessment and Plan:  1. Pain due to onychomycosis of toenails of both feet   2. Ingrown toenail without  infection    -Examined patient. -No new findings. No new orders. -Toenails 1-5 b/l were debrided in length and girth with sterile nail nippers and dremel without iatrogenic bleeding.  -Offending nail border debrided and curretaged L hallux and R hallux utilizing sterile nail nipper and currette. Border cleansed with alcohol and triple antibiotic applied. No further treatment required by patient/caregiver. -Patient to report any pedal injuries to medical professional immediately. -Patient to continue soft, supportive shoe gear daily. -Patient/POA to call should there be question/concern in the interim.  Return in about 3 months (around 09/17/2020) for nail trim.  Marzetta Board, DPM

## 2020-08-11 DIAGNOSIS — H0100A Unspecified blepharitis right eye, upper and lower eyelids: Secondary | ICD-10-CM | POA: Diagnosis not present

## 2020-08-11 DIAGNOSIS — H5213 Myopia, bilateral: Secondary | ICD-10-CM | POA: Diagnosis not present

## 2020-08-11 DIAGNOSIS — H02054 Trichiasis without entropian left upper eyelid: Secondary | ICD-10-CM | POA: Diagnosis not present

## 2020-08-11 DIAGNOSIS — H353131 Nonexudative age-related macular degeneration, bilateral, early dry stage: Secondary | ICD-10-CM | POA: Diagnosis not present

## 2020-08-12 ENCOUNTER — Ambulatory Visit
Admission: RE | Admit: 2020-08-12 | Discharge: 2020-08-12 | Disposition: A | Discharge status: Home / Self Care | Payer: Medicare PPO | Source: Ambulatory Visit | Attending: Internal Medicine | Admitting: Internal Medicine

## 2020-08-12 DIAGNOSIS — I2510 Atherosclerotic heart disease of native coronary artery without angina pectoris: Secondary | ICD-10-CM | POA: Diagnosis not present

## 2020-08-12 DIAGNOSIS — J9811 Atelectasis: Secondary | ICD-10-CM | POA: Diagnosis not present

## 2020-08-12 DIAGNOSIS — R911 Solitary pulmonary nodule: Secondary | ICD-10-CM

## 2020-08-12 DIAGNOSIS — I700 Atherosclerosis of aorta: Secondary | ICD-10-CM | POA: Diagnosis not present

## 2020-08-12 DIAGNOSIS — J439 Emphysema, unspecified: Secondary | ICD-10-CM | POA: Diagnosis not present

## 2020-08-17 ENCOUNTER — Telehealth: Payer: Self-pay | Admitting: Medical Oncology

## 2020-08-17 ENCOUNTER — Encounter: Payer: Self-pay | Admitting: *Deleted

## 2020-08-17 NOTE — Progress Notes (Signed)
I received referral on Kimberly Jordan today.  The referral states for her to be seen with [facility].  I updated their office on referral.

## 2020-08-17 NOTE — Telephone Encounter (Signed)
Referral received . Lung nodule . CT 8/13.

## 2020-08-22 NOTE — Telephone Encounter (Signed)
I received referral and is states refer to [facility].  I notified their office and the patient has an appt.

## 2020-08-24 ENCOUNTER — Encounter: Payer: Self-pay | Admitting: Cardiothoracic Surgery

## 2020-08-24 ENCOUNTER — Other Ambulatory Visit: Payer: Self-pay

## 2020-08-24 ENCOUNTER — Institutional Professional Consult (permissible substitution): Payer: Medicare PPO | Admitting: Cardiothoracic Surgery

## 2020-08-24 VITALS — BP 133/70 | HR 71 | Temp 98.1°F | Resp 20 | Ht 61.0 in | Wt 106.0 lb

## 2020-08-24 DIAGNOSIS — R911 Solitary pulmonary nodule: Secondary | ICD-10-CM | POA: Diagnosis not present

## 2020-08-24 NOTE — Progress Notes (Signed)
[address]       [place] [address]             [phone]                    [name] Medical Record #[id number] Date of Birth: 1929-03-28  Referring: Crist Infante, MD Primary Care: Prince Solian, MD Primary Cardiologist: Candee Furbish, MD  Chief Complaint:    Chief Complaint  Patient presents with  . Lung Lesion    Surgcal consult, Chest CT 08/12/20    History of Present Illness:    Kimberly Jordan 86 y.o. female is seen in the office  today for abnormal CT scan with groundglass opacity left lower lobe superior segment.  This was first noted on a CT scan in 2015, patient had follow-up scans in 2016 2017 and then again in 2020 and 2021.  She was referred to thoracic surgery office for further evaluation care.  She is a very distant smoker, she notes that she perhaps smoked 1 pack/year for 15 years and quit 50 years ago.   Patient has a history of breast cancer in the early 1990s, , she thinks she had radiation following surgical treatment. Current Activity/ Functional Status:  Patient is independent with mobility/ambulation, transfers, ADL's, IADL's.   Zubrod Score: At the time of surgery this patient's most appropriate activity status/level should be described as: []     0    Normal activity, no symptoms [x]     1    Restricted in physical strenuous activity but ambulatory, able to do out light work []     2    Ambulatory and capable of self care, unable to do work activities, up and about               >50 % of waking hours                              []     3    Only limited self care, in bed greater than 50% of waking hours []     4    Completely disabled, no self care, confined to bed or chair []     5    Moribund   Past Medical History:  Diagnosis Date  . Blepharitis   . Breast cancer ([place]) 1992   right breast   . Ectopic pregnancy   . Hypertension   . Osteoporosis   . 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 CATARACT EXTRACTION  2010, 2011   2010 right eye, 2011 left eye  . COLONOSCOPY W/ POLYPECTOMY    . cyst removed  2012   from right wrist  . ECTOPIC PREGNANCY SURGERY     salpingectomy  . HAND SURGERY  2007   right hand-ruptured tendon  . KNEE ARTHROSCOPY  1998  . LAPAROTOMY N/A 03/22/2014   Procedure: EXPLORATORY LAPAROTOMY;  Surgeon: Merrie Roof, MD;  Location: [facility];  Service: General;  Laterality: N/A;  . LEFT HEART CATHETERIZATION WITH CORONARY ANGIOGRAM N/A 03/25/2014   Procedure: LEFT HEART CATHETERIZATION WITH CORONARY ANGIOGRAM;  Surgeon: Jettie Booze, MD;  Location: [facility] CATH LAB;  Service: Cardiovascular;  Laterality: N/A;  . ORIF ELBOW FRACTURE Left 10/15/2014   Procedure: OPEN REDUCTION INTERNAL FIXATION (ORIF) ELBOW/OLECRANON FRACTURE;  Surgeon:  Renette Butters, MD;  Location: [facility];  Service: Orthopedics;  Laterality: Left;  stryker elbow plate   . TOTAL ABDOMINAL HYSTERECTOMY W/ BILATERAL SALPINGOOPHORECTOMY  1994    Family History  Problem Relation Age of Onset  . Osteoporosis Mother   . Arthritis Mother   . Hypertension Mother   . CVA Father   . Hypertension Father   . Stroke Father   . Migraines Father      Social History   Tobacco Use  Smoking Status Former Smoker  Smokeless Tobacco Never Used    Social History   Substance and Sexual Activity  Alcohol Use Yes  . Alcohol/week: 5.0 standard drinks  . Types: 5 Glasses of wine per week   Comment: wine daily     No Known Allergies  Current Outpatient Medications  Medication Sig Dispense Refill  . amLODipine (NORVASC) 10 MG tablet Take 1 tablet by mouth daily.    Marland Kitchen aspirin 81 MG tablet Take 81 mg by mouth daily.    . Calcium Carbonate-Vitamin D (CALCIUM + D PO) Take 600 mg by mouth 3 (three) times daily.    . carvedilol (COREG) 12.5 MG tablet TAKE 1 TABLET TWICE DAILY WITH FOOD. 180 tablet 3  . clobetasol  cream (TEMOVATE) 8.54 % Apply 1 application topically as needed (For itching on back).     . CREON 24000 units CPEP Take 3 capsules by mouth 3 (three) times daily.    Marland Kitchen doxycycline (VIBRA-TABS) 100 MG tablet Take 100 mg by mouth every evening.     Marland Kitchen erythromycin ophthalmic ointment     . MYRBETRIQ 50 MG TB24 tablet     . OVER THE COUNTER MEDICATION Place 1 application into both eyes as needed. occu scrub    . simvastatin (ZOCOR) 20 MG tablet Take 20 mg by mouth every evening.     Marland Kitchen telmisartan (MICARDIS) 80 MG tablet Take 1 tablet (80 mg total) by mouth daily. 90 tablet 1  . zoledronic acid (RECLAST) 5 MG/100ML SOLN injection Inject 5 mg into the vein once.     No current facility-administered medications for this visit.    Pertinent items are noted in HPI.   Review of Systems:     Cardiac Review of Systems: [Y] = yes  or   [ N ] = no   Chest Pain [ n   ]  Resting SOB [   n] Exertional SOB  [ y ]  Orthopnea [  ]   Pedal Edema [   ]    Palpitations [  ] Syncope  [  ]   Presyncope [   ]   General Review of Systems: [Y] = yes [  ]=no Constitional: recent weight change [  ];  Wt loss over the last 3 months [   ] anorexia [  ]; fatigue [  ]; nausea [  ]; night sweats [  ]; fever [  ]; or chills [  ];           Eye : blurred vision [  ]; diplopia [   ]; vision changes [  ];  Amaurosis fugax[  ]; Resp: cough [  ];  wheezing[  ];  hemoptysis[  ]; shortness of breath[  ]; paroxysmal nocturnal dyspnea[  ]; dyspnea on exertion[  ]; or orthopnea[  ];  GI:  gallstones[  ], vomiting[  ];  dysphagia[  ]; melena[  ];  hematochezia [  ]; heartburn[  ];  Hx of  Colonoscopy[  ]; GU: kidney stones [  ]; hematuria[  ];   dysuria [  ];  nocturia[  ];  history of     obstruction [  ]; urinary frequency [  ]             Skin: rash, swelling[  ];, hair loss[  ];  peripheral edema[  ];  or itching[  ]; Musculosketetal: myalgias[  ];  joint swelling[  ];  joint erythema[  ];  joint pain[  ];  back pain[   ];  Heme/Lymph: bruising[  ];  bleeding[  ];  anemia[  ];  Neuro: TIA[  ];  headaches[  ];  stroke[  ];  vertigo[  ];  seizures[  ];   paresthesias[  ];  difficulty walking[  ];  Psych:depression[  ]; anxiety[  ];  Endocrine: diabetes[  ];  thyroid dysfunction[  ];  Immunizations: Flu up to date [ ? ]; Pneumococcal up to date [?  ]; COVID-62 vacination completed  [ y  ]  Other:     PHYSICAL EXAMINATION: BP 133/70   Pulse 71   Temp 98.1 F (36.7 C) (Skin)   Resp 20   Ht 5\' 1"  (1.549 m)   Wt 106 lb (48.1 kg)   LMP 12/31/1992   SpO2 95%   BMI 20.03 kg/m  General appearance: alert, cooperative, appears stated age and no distress Neck: no adenopathy, no carotid bruit, no JVD, supple, symmetrical, trachea midline and thyroid not enlarged, symmetric, no tenderness/mass/nodules Resp: clear to auscultation bilaterally Cardio: systolic murmur: late systolic 3/6, crescendo at lower left sternal border Extremities: extremities normal, atraumatic, no cyanosis or edema and Homans sign is negative, no sign of DVT Neurologic: Grossly normal  Diagnostic Studies & Laboratory data:     Recent Radiology Findings:   CT CHEST WO CONTRAST  Result Date: 08/12/2020 CLINICAL DATA:  86 year old with left lower lobe nodule. History of right breast cancer with lumpectomy and radiation. EXAM: CT CHEST WITHOUT CONTRAST TECHNIQUE: Multidetector CT imaging of the chest was performed following the standard protocol without IV contrast. COMPARISON:  Most recent comparison 08/12/2019. Most remote comparison 11/09/2014 FINDINGS: Cardiovascular: Atherosclerosis of the thoracic aorta. Spine dilated right pulmonary artery measuring 3.1 cm, unchanged. Heart is normal in size. There are coronary artery calcifications. Dense calcifications in the region of the mitral valve. No pericardial effusion. Mediastinum/Nodes: High-density material in the distal esophagus is likely ingested material including bismuth containing  products. Upper esophagus is slightly patulous. Tiny hiatal hernia. Small mediastinal lymph nodes are all subcentimeter short axis. Limited assessment for hilar adenopathy in the absence of IV contrast. No visualized thyroid nodule. Lungs/Pleura: Sub solid nodule in the superior segment of the left lower lobe measures 13 x 13 mm, previously 12 x 12 mm. There is increasing solid component, with solid component measuring at least 6 mm. Nodule abuts the inter lobar fissure in the pleura posteriorly. Mild to moderate emphysema. Right middle lobe scarring is likely postradiation change. Subsegmental atelectasis in the left lower lobe. No new pulmonary nodule or acute consolidation. No findings of pulmonary edema. Mild biapical pleuroparenchymal scarring. There is no pleural fluid. Upper Abdomen: No acute findings. Musculoskeletal: Coarse right breast calcifications. Surgical clips in the right axilla. Exaggerated thoracic kyphosis. There are no acute or suspicious osseous abnormalities. IMPRESSION: 1. Sub solid nodule in the superior segment of the left lower lobe, generally stable in size since prior exams but progressively increasing solid component, highly  suspicious for low-grade adenocarcinoma. The solid component measures at least 6 mm. 2. No evidence of metastatic disease in the chest. 3. Emphysema. 4. Coronary artery calcifications. Mitral valve calcifications. 5. High-density material in the esophagus is likely ingested material such as Pepto-Bismol. Recommend clinical correlation. Possibility of delayed esophageal transit is raised. Aortic Atherosclerosis (ICD10-I70.0) and Emphysema (ICD10-J43.9). Electronically Signed   By: Keith Rake M.D.   On: 08/12/2020 21:22     I have independently reviewed the above radiology studies  and reviewed the findings with the patient.   Recent Lab Findings: Lab Results  Component Value Date   WBC 7.4 10/15/2014   HGB 11.8 (L) 10/15/2014   HCT 34.0 (L) 10/15/2014    PLT 144 (L) 10/15/2014   GLUCOSE 90 11/22/2015   ALT 22 03/25/2014   AST 29 03/25/2014   NA 137 11/22/2015   K 4.0 11/22/2015   CL 99 11/22/2015   CREATININE 0.77 11/22/2015   BUN 19 11/22/2015   CO2 28 11/22/2015   INR 1.16 10/14/2014   Echo 06/2014  Study Conclusions   - Left ventricle: The cavity size was normal. Systolic function was  moderately to severely reduced. The estimated ejection fraction  was 35%. Wall motion was normal; there were no regional wall  motion abnormalities. Doppler parameters are consistent with  abnormal left ventricular relaxation (grade 1 diastolic  dysfunction). Doppler parameters are consistent with elevated  ventricular end-diastolic filling pressure.  - Aortic valve: Trileaflet; normal thickness leaflets.  Transvalvular velocity was within the normal range. There was no  stenosis. There was no regurgitation.  - Aortic root: The aortic root was normal in size.  - Mitral valve: Structurally normal valve. There was mild  regurgitation.  - Left atrium: The atrium was normal in size.  - Right ventricle: The cavity size was normal. Wall thickness was  normal.  - Pulmonary arteries: Systolic pressure was moderately increased.  PA peak pressure: 54 mm Hg (S).     Assessment / Plan:   1/ 1.2 cm ground glass opacity superior segment left lower lobe present since 2015, but now with some increase solid component-I discussed with the patient the findings on CT and the possibility that this groundglass opacity represents slowly progressive adenocarcinoma of the lung.  The patient notes that she is to have her 89th birthday soon and was not inclined to consider surgical resection.  We discussed biopsy techniques and possible stereotactic radiotherapy as treatment options.  At this point she prefers to continue to follow the lesion-we will obtain a follow-up CT scan in January 2022.    2/ Echo evidence of lv dysfunction and moderate  pulmonary hypertension     Grace Isaac MD      [facility] [place] [address] Office [phone]     08/24/2020 4:57 PM

## 2020-08-26 ENCOUNTER — Encounter: Payer: Medicare PPO | Admitting: Thoracic Surgery (Cardiothoracic Vascular Surgery)

## 2020-09-23 ENCOUNTER — Encounter: Payer: Self-pay | Admitting: Podiatry

## 2020-09-23 ENCOUNTER — Other Ambulatory Visit: Payer: Self-pay

## 2020-09-23 ENCOUNTER — Ambulatory Visit: Payer: Medicare PPO | Admitting: Podiatry

## 2020-09-23 DIAGNOSIS — B351 Tinea unguium: Secondary | ICD-10-CM

## 2020-09-23 DIAGNOSIS — M79674 Pain in right toe(s): Secondary | ICD-10-CM

## 2020-09-23 DIAGNOSIS — M79675 Pain in left toe(s): Secondary | ICD-10-CM | POA: Diagnosis not present

## 2020-09-27 NOTE — Progress Notes (Signed)
Subjective:  Patient ID: Kimberly Jordan, female    DOB: 05-21-1929,  MRN: [id number]  86 y.o. female presents with painful thick toenails that are difficult to trim. Pain interferes with ambulation. Aggravating factors include wearing enclosed shoe gear. Pain is relieved with periodic professional debridement..    Review of Systems: Negative except as noted in the HPI.  Past Medical History:  Diagnosis Date  . Blepharitis   . Breast cancer ([place]) 1992   right breast   . Ectopic pregnancy   . Hypertension   . Osteoporosis   . 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 CATARACT EXTRACTION  2010, 2011   2010 right eye, 2011 left eye  . COLONOSCOPY W/ POLYPECTOMY    . cyst removed  2012   from right wrist  . ECTOPIC PREGNANCY SURGERY     salpingectomy  . HAND SURGERY  2007   right hand-ruptured tendon  . KNEE ARTHROSCOPY  1998  . LAPAROTOMY N/A 03/22/2014   Procedure: EXPLORATORY LAPAROTOMY;  Surgeon: Merrie Roof, MD;  Location: [facility];  Service: General;  Laterality: N/A;  . LEFT HEART CATHETERIZATION WITH CORONARY ANGIOGRAM N/A 03/25/2014   Procedure: LEFT HEART CATHETERIZATION WITH CORONARY ANGIOGRAM;  Surgeon: Jettie Booze, MD;  Location: [facility] CATH [facility];  Service: Cardiovascular;  Laterality: N/A;  . ORIF ELBOW FRACTURE Left 10/15/2014   Procedure: OPEN REDUCTION INTERNAL FIXATION (ORIF) ELBOW/OLECRANON FRACTURE;  Surgeon: Renette Butters, MD;  Location: [facility];  Service: Orthopedics;  Laterality: Left;  stryker elbow plate   . TOTAL ABDOMINAL HYSTERECTOMY W/ BILATERAL SALPINGOOPHORECTOMY  1994   Patient Active Problem List   Diagnosis Date Noted  . Protein-calorie malnutrition, severe ([place]) 09/02/2015  . Essential hypertension 09/02/2015  . Open arm fracture 10/14/2014  . NICM (nonischemic cardiomyopathy) ([place]) 09/15/2014  . Primary localized osteoarthrosis, lower leg 06/15/2014  .  Malnutrition of moderate degree ([place]) 03/26/2014  . Acute anterior myocardial infarction ([place]) 03/25/2014  . Acute combined systolic and diastolic heart failure ([place]) 03/25/2014  . Congestive dilated cardiomyopathy ([place]) 03/25/2014  . Acute respiratory failure with hypoxia ([place]) 03/25/2014  . SBO (small bowel obstruction) ([place]) 03/19/2014    Current Outpatient Medications:  .  amLODipine (NORVASC) 10 MG tablet, Take 1 tablet by mouth daily., Disp: , Rfl:  .  aspirin 81 MG tablet, Take 81 mg by mouth daily., Disp: , Rfl:  .  Calcium Carbonate-Vitamin D (CALCIUM + D PO), Take 600 mg by mouth 3 (three) times daily., Disp: , Rfl:  .  carvedilol (COREG) 12.5 MG tablet, TAKE 1 TABLET TWICE DAILY WITH FOOD., Disp: 180 tablet, Rfl: 3 .  clobetasol cream (TEMOVATE) 7.90 %, Apply 1 application topically as needed (For itching on back). , Disp: , Rfl:  .  CREON 24000 units CPEP, Take 3 capsules by mouth 3 (three) times daily., Disp: , Rfl:  .  doxycycline (VIBRA-TABS) 100 MG tablet, Take 100 mg by mouth every evening. , Disp: , Rfl:  .  erythromycin ophthalmic ointment, , Disp: , Rfl:  .  MYRBETRIQ 50 MG TB24 tablet, , Disp: , Rfl:  .  OVER THE COUNTER MEDICATION, Place 1 application into both eyes as needed. occu scrub, Disp: , Rfl:  .  simvastatin (ZOCOR) 20 MG tablet, Take 20 mg by mouth every evening. , Disp: , Rfl:  .  telmisartan (MICARDIS) 80 MG tablet, Take 1 tablet (80 mg total) by  mouth daily., Disp: 90 tablet, Rfl: 1 .  zoledronic acid (RECLAST) 5 MG/100ML SOLN injection, Inject 5 mg into the vein once., Disp: , Rfl:  No Known Allergies Social History   Occupational History  . Not on file  Tobacco Use  . Smoking status: Former Research scientist (life sciences)  . Smokeless tobacco: Never Used  Vaping Use  . Vaping Use: Never used  Substance and Sexual Activity  . Alcohol use: Yes    Alcohol/week: 5.0 standard drinks    Types: 5 Glasses of wine per week    Comment: wine daily  . Drug use: No  . Sexual  activity: Not Currently    Birth control/protection: Surgical    Comment: TAH/BSO    Objective:   Constitutional Pt is a pleasant 86 y.o. Caucasian female WD, WN in NAD.Marland Kitchen AAO x 3.   Vascular Capillary refill time to digits immediate b/l. Palpable pedal pulses b/l LE. Pedal hair sparse. Lower extremity skin temperature gradient within normal limits. No pain with calf compression b/l. No cyanosis or clubbing noted.  Neurologic Normal speech. Oriented to person, place, and time. Protective sensation intact 5/5 intact bilaterally with 10g monofilament b/l. Vibratory sensation intact b/l.  Dermatologic Pedal skin with normal turgor, texture and tone bilaterally. No open wounds bilaterally. No interdigital macerations bilaterally. Toenails 1-5 b/l elongated, discolored, dystrophic, thickened, crumbly with subungual debris and tenderness to dorsal palpation.  Orthopedic: Normal muscle strength 5/5 to all lower extremity muscle groups bilaterally. No pain crepitus or joint limitation noted with ROM b/l. No gross bony deformities bilaterally.   Radiographs: None Assessment:  No diagnosis found. Plan:  Patient was evaluated and treated and all questions answered.  Onychomycosis with pain -Nails palliatively debridement as below. -Educated on self-care  Procedure: Nail Debridement Rationale: Pain Type of Debridement: manual, sharp debridement. Instrumentation: Nail nipper, rotary burr. Number of Nails: 10  -Examined patient. -No new findings. No new orders. -Toenails 1-5 b/l were debrided in length and girth with sterile nail nippers and dremel without iatrogenic bleeding.  -Patient to report any pedal injuries to medical professional immediately. -Patient to continue soft, supportive shoe gear daily. -Patient/POA to call should there be question/concern in the interim.  Return in about 3 months (around 12/23/2020).  Marzetta Board, DPM

## 2020-10-20 DIAGNOSIS — L821 Other seborrheic keratosis: Secondary | ICD-10-CM | POA: Diagnosis not present

## 2020-10-20 DIAGNOSIS — L570 Actinic keratosis: Secondary | ICD-10-CM | POA: Diagnosis not present

## 2020-10-20 DIAGNOSIS — D485 Neoplasm of uncertain behavior of skin: Secondary | ICD-10-CM | POA: Diagnosis not present

## 2020-10-20 DIAGNOSIS — L853 Xerosis cutis: Secondary | ICD-10-CM | POA: Diagnosis not present

## 2020-10-20 DIAGNOSIS — Z85828 Personal history of other malignant neoplasm of skin: Secondary | ICD-10-CM | POA: Diagnosis not present

## 2020-11-11 ENCOUNTER — Ambulatory Visit: Payer: Medicare Other | Admitting: Obstetrics & Gynecology

## 2020-12-09 ENCOUNTER — Other Ambulatory Visit: Payer: Self-pay | Admitting: Cardiothoracic Surgery

## 2020-12-09 DIAGNOSIS — R911 Solitary pulmonary nodule: Secondary | ICD-10-CM

## 2020-12-12 DIAGNOSIS — R7301 Impaired fasting glucose: Secondary | ICD-10-CM | POA: Diagnosis not present

## 2020-12-12 DIAGNOSIS — M810 Age-related osteoporosis without current pathological fracture: Secondary | ICD-10-CM | POA: Diagnosis not present

## 2020-12-12 DIAGNOSIS — M1990 Unspecified osteoarthritis, unspecified site: Secondary | ICD-10-CM | POA: Diagnosis not present

## 2020-12-12 DIAGNOSIS — I428 Other cardiomyopathies: Secondary | ICD-10-CM | POA: Diagnosis not present

## 2020-12-12 DIAGNOSIS — I10 Essential (primary) hypertension: Secondary | ICD-10-CM | POA: Diagnosis not present

## 2020-12-12 DIAGNOSIS — D692 Other nonthrombocytopenic purpura: Secondary | ICD-10-CM | POA: Diagnosis not present

## 2020-12-12 DIAGNOSIS — K8681 Exocrine pancreatic insufficiency: Secondary | ICD-10-CM | POA: Diagnosis not present

## 2020-12-12 DIAGNOSIS — E785 Hyperlipidemia, unspecified: Secondary | ICD-10-CM | POA: Diagnosis not present

## 2020-12-12 DIAGNOSIS — K439 Ventral hernia without obstruction or gangrene: Secondary | ICD-10-CM | POA: Diagnosis not present

## 2021-01-23 NOTE — Progress Notes (Signed)
[address]       [place], [address]             [phone]                    Callee S Araque [place] Medical Record [id number] Date of Birth: 1929-11-07  Referring: Crist Infante, MD Primary Care: Prince Solian, MD Primary Cardiologist: Candee Furbish, MD  Chief Complaint:    Chief Complaint  Patient presents with  . Lung Lesion    1 year f/u with Chest CT    History of Present Illness:    Kimberly HEIDELBERGER 87 y.o. female is seen in the office  today for follow up and repeat ct of chest  After   abnormal CT scan with groundglass opacity left lower lobe superior segment.  This was first noted on a CT scan in 2015, patient had follow-up scans in 2016 2017 and then again in 2020 and 2021.  She was referred to thoracic surgery office for further evaluation care.  She is a very distant smoker, she notes that she perhaps smoked 1 pack/year for 15 years and quit 50 years ago.   Patient has a history of breast cancer in the early 1990s, , she thinks she had radiation following surgical treatment.  Current Activity/ Functional Status:  Patient is independent with mobility/ambulation, transfers, ADL's, IADL's.   Zubrod Score: At the time of surgery this patient's most appropriate activity status/level should be described as: []     0    Normal activity, no symptoms [x]     1    Restricted in physical strenuous activity but ambulatory, able to do out light work []     2    Ambulatory and capable of self care, unable to do work activities, up and about               >50 % of waking hours                              []     3    Only limited self care, in bed greater than 50% of waking hours []     4    Completely disabled, no self care, confined to bed or chair []     5    Moribund   Past Medical History:  Diagnosis Date  . Blepharitis   . Breast cancer ([place]) 1992   right breast   . Ectopic pregnancy   . Hypertension   . Osteoporosis   . 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 CATARACT EXTRACTION  2010, 2011   2010 right eye, 2011 left eye  . COLONOSCOPY W/ POLYPECTOMY    . cyst removed  2012   from right wrist  . ECTOPIC PREGNANCY SURGERY     salpingectomy  . HAND SURGERY  2007   right hand-ruptured tendon  . KNEE ARTHROSCOPY  1998  . LAPAROTOMY N/A 03/22/2014   Procedure: EXPLORATORY LAPAROTOMY;  Surgeon: Merrie Roof, MD;  Location: [facility];  Service: General;  Laterality: N/A;  . LEFT HEART CATHETERIZATION WITH CORONARY ANGIOGRAM N/A 03/25/2014   Procedure: LEFT HEART CATHETERIZATION WITH CORONARY ANGIOGRAM;  Surgeon: Jettie Booze, MD;  Location: [facility] CATH [facility];  Service: Cardiovascular;  Laterality: N/A;  . ORIF ELBOW FRACTURE Left  10/15/2014   Procedure: OPEN REDUCTION INTERNAL FIXATION (ORIF) ELBOW/OLECRANON FRACTURE;  Surgeon: Renette Butters, MD;  Location: [facility];  Service: Orthopedics;  Laterality: Left;  stryker elbow plate   . TOTAL ABDOMINAL HYSTERECTOMY W/ BILATERAL SALPINGOOPHORECTOMY  1994    Family History  Problem Relation Age of Onset  . Osteoporosis Mother   . Arthritis Mother   . Hypertension Mother   . CVA Father   . Hypertension Father   . Stroke Father   . Migraines Father      Social History   Tobacco Use  Smoking Status Former Smoker  Smokeless Tobacco Never Used    Social History   Substance and Sexual Activity  Alcohol Use Yes  . Alcohol/week: 5.0 standard drinks  . Types: 5 Glasses of wine per week   Comment: wine daily     No Known Allergies  Current Outpatient Medications  Medication Sig Dispense Refill  . amLODipine (NORVASC) 10 MG tablet Take 1 tablet by mouth daily.    Marland Kitchen aspirin 81 MG tablet Take 81 mg by mouth daily.    . Calcium Carbonate-Vitamin D (CALCIUM + D PO) Take 600 mg by mouth 3 (three) times daily.    . carvedilol (COREG) 12.5 MG tablet TAKE 1 TABLET TWICE DAILY WITH FOOD.  180 tablet 3  . clobetasol cream (TEMOVATE) 7.37 % Apply 1 application topically as needed (For itching on back).     . CREON 24000 units CPEP Take 3 capsules by mouth 3 (three) times daily. Takes 2- 24000 and 1- 36000 TID    . doxycycline (VIBRA-TABS) 100 MG tablet Take 100 mg by mouth every evening.     Marland Kitchen erythromycin ophthalmic ointment     . MYRBETRIQ 50 MG TB24 tablet     . OVER THE COUNTER MEDICATION Place 1 application into both eyes as needed. occu scrub    . simvastatin (ZOCOR) 20 MG tablet Take 20 mg by mouth every evening.     Marland Kitchen telmisartan (MICARDIS) 80 MG tablet Take 1 tablet (80 mg total) by mouth daily. 90 tablet 1  . zoledronic acid (RECLAST) 5 MG/100ML SOLN injection Inject 5 mg into the vein once.     No current facility-administered medications for this visit.    Pertinent items are noted in HPI.   Review of Systems:     Cardiac Review of Systems: [Y] = yes  or   [ N ] = no   Chest Pain [ n   ]  Resting SOB [   n] Exertional SOB  [ y ]  Orthopnea [  ]   Pedal Edema [   ]    Palpitations [  ] Syncope  [  ]   Presyncope [   ]   General Review of Systems: [Y] = yes [  ]=no Constitional: recent weight change [  ];  Wt loss over the last 3 months [   ] anorexia [  ]; fatigue [  ]; nausea [  ]; night sweats [  ]; fever [  ]; or chills [  ];           Eye : blurred vision [  ]; diplopia [   ]; vision changes [  ];  Amaurosis fugax[  ]; Resp: cough [  ];  wheezing[  ];  hemoptysis[  ]; shortness of breath[  ]; paroxysmal nocturnal dyspnea[  ]; dyspnea on exertion[  ]; or orthopnea[  ];  GI:  gallstones[  ],  vomiting[  ];  dysphagia[  ]; melena[  ];  hematochezia [  ]; heartburn[  ];   Hx of  Colonoscopy[  ]; GU: kidney stones [  ]; hematuria[  ];   dysuria [  ];  nocturia[  ];  history of     obstruction [  ]; urinary frequency [  ]             Skin: rash, swelling[  ];, hair loss[  ];  peripheral edema[  ];  or itching[  ]; Musculosketetal: myalgias[  ];  joint swelling[  ];   joint erythema[  ];  joint pain[  ];  back pain[  ];  Heme/Lymph: bruising[  ];  bleeding[  ];  anemia[  ];  Neuro: TIA[  ];  headaches[  ];  stroke[  ];  vertigo[  ];  seizures[  ];   paresthesias[  ];  difficulty walking[  ];  Psych:depression[  ]; anxiety[  ];  Endocrine: diabetes[  ];  thyroid dysfunction[  ];  Immunizations: Flu up to date [ ? ]; Pneumococcal up to date [?  ]; COVID-15 vacination completed  [ y  ]  Other:     PHYSICAL EXAMINATION: BP 135/73 (BP Location: Left Arm, Patient Position: Sitting, Cuff Size: Small)   Pulse 69   Temp 98.3 F (36.8 C) (Skin)   Resp 20   Ht 5\' 1"  (1.549 m)   Wt 109 lb 6.4 oz (49.6 kg)   LMP 12/31/1992   SpO2 94% Comment: RA  BMI 20.67 kg/m  General appearance: alert, appears stated age and no distress Head: Normocephalic, without obvious abnormality, atraumatic Neck: no adenopathy, no carotid bruit, no JVD, supple, symmetrical, trachea midline and thyroid not enlarged, symmetric, no tenderness/mass/nodules Lymph nodes: Cervical, supraclavicular, and axillary nodes normal. Resp: clear to auscultation bilaterally Cardio: regular rate and rhythm, S1, S2 normal, no murmur, click, rub or gallop GI: soft, non-tender; bowel sounds normal; no masses,  no organomegaly Extremities: extremities normal, atraumatic, no cyanosis or edema Neurologic: Grossly normal  Diagnostic Studies & Laboratory data:     Recent Radiology Findings:   CT CHEST WO CONTRAST  Result Date: 01/26/2021 CLINICAL DATA:  Lung nodule, history of right breast cancer with lumpectomy and radiation therapy. EXAM: CT CHEST WITHOUT CONTRAST TECHNIQUE: Multidetector CT imaging of the chest was performed following the standard protocol without IV contrast. COMPARISON:  Multiple exams, including 08/12/2020 FINDINGS: Cardiovascular: Coronary, aortic arch, and branch vessel atherosclerotic vascular disease. Dense and heavy calcification of the mitral valve. Mediastinum/Nodes: Once  again there is high density material in the esophagus, query dysmotility. Small type 1 hiatal hernia. Prior right axillary node dissection. Branching calcifications in the right upper breast as before. Lungs/Pleura: Severe centrilobular emphysema. Biapical pleuroparenchymal scarring with associated calcifications. Subpleural densities in the right anterior chest probably relate to prior radiation therapy or mild scarring in the right middle lobe. Pleural based nodule along the right lower lobe 0.4 cm in diameter on image 102 of series 9, stable. Sub solid 0.8 by 0.6 cm right lower lobe nodule on image 69 of series 9, stable from 08/12/2020, but measuring 0.4 by 0.3 cm on 08/22/2016. Slow growth suspicious for low-grade adenocarcinoma. Faint subpleural ground-glass nodularity posteriorly in the right lower lobe measuring 1.2 by 0.8 cm on image 79 series 9, previously about 1.0 by 0.6 cm on 08/12/2019 and faintly seen and measuring about 1.2 by 0.9 cm on 08/22/2016, nonspecific. Mixed density superior segment left lower lobe  nodule 1.5 by 1.3 cm on image 44 series 9, stable from 08/12/2020 but increasing solidity compared to 08/22/2016 and 02/11/2015 favoring low-grade adenocarcinoma. Pleural-based 0.4 cm nodule in the left lower lobe on image 97 series 9, stable. Upper Abdomen: Abdominal aortic atherosclerosis. Musculoskeletal: Exaggerated thoracic kyphosis. Degenerative sternoclavicular arthropathy, right greater than left. Age indeterminate superior endplate compression fracture at L3. Dextroconvex thoracic scoliosis. IMPRESSION: 1. Stable appearance of the otherwise slow growing nodule in the superior segment left lower lobe, probably low-grade adenocarcinoma. Similarly there is a stable from 08/30/2020 but otherwise slow growing subsolid nodule in the right lower lobe which is probably low-grade adenocarcinoma. A third ground-glass density nodule posteriorly in the right lower lobe has been stable for years. 2.  Other imaging findings of potential clinical significance: Aortic Atherosclerosis (ICD10-I70.0). Coronary atherosclerosis. Dense mitral valve calcification. High density material in the esophagus, query dysmotility. Small type 1 hiatal hernia. Emphysema (ICD10-J43.9). Age-indeterminate superior endplate compression fracture at L3. Dextroconvex thoracic scoliosis. Electronically Signed   By: Van Clines M.D.   On: 01/26/2021 10:04    I have independently reviewed the above radiology studies  and reviewed the findings with the patient.   CT CHEST WO CONTRAST  Result Date: 08/12/2020 CLINICAL DATA:  87 year old with left lower lobe nodule. History of right breast cancer with lumpectomy and radiation. EXAM: CT CHEST WITHOUT CONTRAST TECHNIQUE: Multidetector CT imaging of the chest was performed following the standard protocol without IV contrast. COMPARISON:  Most recent comparison 08/12/2019. Most remote comparison 11/09/2014 FINDINGS: Cardiovascular: Atherosclerosis of the thoracic aorta. Spine dilated right pulmonary artery measuring 3.1 cm, unchanged. Heart is normal in size. There are coronary artery calcifications. Dense calcifications in the region of the mitral valve. No pericardial effusion. Mediastinum/Nodes: High-density material in the distal esophagus is likely ingested material including bismuth containing products. Upper esophagus is slightly patulous. Tiny hiatal hernia. Small mediastinal lymph nodes are all subcentimeter short axis. Limited assessment for hilar adenopathy in the absence of IV contrast. No visualized thyroid nodule. Lungs/Pleura: Sub solid nodule in the superior segment of the left lower lobe measures 13 x 13 mm, previously 12 x 12 mm. There is increasing solid component, with solid component measuring at least 6 mm. Nodule abuts the inter lobar fissure in the pleura posteriorly. Mild to moderate emphysema. Right middle lobe scarring is likely postradiation change.  Subsegmental atelectasis in the left lower lobe. No new pulmonary nodule or acute consolidation. No findings of pulmonary edema. Mild biapical pleuroparenchymal scarring. There is no pleural fluid. Upper Abdomen: No acute findings. Musculoskeletal: Coarse right breast calcifications. Surgical clips in the right axilla. Exaggerated thoracic kyphosis. There are no acute or suspicious osseous abnormalities. IMPRESSION: 1. Sub solid nodule in the superior segment of the left lower lobe, generally stable in size since prior exams but progressively increasing solid component, highly suspicious for low-grade adenocarcinoma. The solid component measures at least 6 mm. 2. No evidence of metastatic disease in the chest. 3. Emphysema. 4. Coronary artery calcifications. Mitral valve calcifications. 5. High-density material in the esophagus is likely ingested material such as Pepto-Bismol. Recommend clinical correlation. Possibility of delayed esophageal transit is raised. Aortic Atherosclerosis (ICD10-I70.0) and Emphysema (ICD10-J43.9). Electronically Signed   By: Keith Rake M.D.   On: 08/12/2020 21:22    Recent Lab Findings: Lab Results  Component Value Date   WBC 7.4 10/15/2014   HGB 11.8 (L) 10/15/2014   HCT 34.0 (L) 10/15/2014   PLT 144 (L) 10/15/2014   GLUCOSE 90 11/22/2015  ALT 22 03/25/2014   AST 29 03/25/2014   NA 137 11/22/2015   K 4.0 11/22/2015   CL 99 11/22/2015   CREATININE 0.77 11/22/2015   BUN 19 11/22/2015   CO2 28 11/22/2015   INR 1.16 10/14/2014   Echo 06/2014  Study Conclusions   - Left ventricle: The cavity size was normal. Systolic function was  moderately to severely reduced. The estimated ejection fraction  was 35%. Wall motion was normal; there were no regional wall  motion abnormalities. Doppler parameters are consistent with  abnormal left ventricular relaxation (grade 1 diastolic  dysfunction). Doppler parameters are consistent with elevated  ventricular  end-diastolic filling pressure.  - Aortic valve: Trileaflet; normal thickness leaflets.  Transvalvular velocity was within the normal range. There was no  stenosis. There was no regurgitation.  - Aortic root: The aortic root was normal in size.  - Mitral valve: Structurally normal valve. There was mild  regurgitation.  - Left atrium: The atrium was normal in size.  - Right ventricle: The cavity size was normal. Wall thickness was  normal.  - Pulmonary arteries: Systolic pressure was moderately increased.  PA peak pressure: 54 mm Hg (S).     Assessment / Plan:   1/ 1.2 cm ground glass opacity superior segment left lower lobe present since 2015, but now with some increase solid component-I discussed with the patient the findings on CT and the possibility that this groundglass opacity represents slowly progressive adenocarcinoma of the lung.  With age and pulmonary disease and her desire not to have surgery we discussed none operative approach for what appears to be indolent adenocarcinoma of the lung   We discussed biopsy techniques and possible stereotactic radiotherapy as treatment options.  Biopsy options are limited - navigation bx does not have bronch approaching lesion- pulmonary hypertension and underlying lung disease makes ct directed bx of increased risk.  Patient is agreeable to discuss with radiation oncology the option of stereotatic radiation treatment of the enlarging lesion.     2/ Echo evidence of lv dysfunction and moderate pulmonary hypertension    Follow up prn depending  on treatment decisions    Grace Isaac MD      [address] [place] [address] Office [phone]     01/26/2021 1:09 PM

## 2021-01-26 ENCOUNTER — Other Ambulatory Visit: Payer: Self-pay

## 2021-01-26 ENCOUNTER — Ambulatory Visit
Admission: RE | Admit: 2021-01-26 | Discharge: 2021-01-26 | Disposition: A | Discharge status: Home / Self Care | Payer: Medicare PPO | Source: Ambulatory Visit | Attending: Cardiothoracic Surgery | Admitting: Cardiothoracic Surgery

## 2021-01-26 ENCOUNTER — Ambulatory Visit: Payer: Medicare PPO | Admitting: Cardiothoracic Surgery

## 2021-01-26 ENCOUNTER — Encounter: Payer: Self-pay | Admitting: Cardiothoracic Surgery

## 2021-01-26 VITALS — BP 135/73 | HR 69 | Temp 98.3°F | Resp 20 | Ht 61.0 in | Wt 109.4 lb

## 2021-01-26 DIAGNOSIS — K449 Diaphragmatic hernia without obstruction or gangrene: Secondary | ICD-10-CM | POA: Diagnosis not present

## 2021-01-26 DIAGNOSIS — Z853 Personal history of malignant neoplasm of breast: Secondary | ICD-10-CM | POA: Diagnosis not present

## 2021-01-26 DIAGNOSIS — R911 Solitary pulmonary nodule: Secondary | ICD-10-CM | POA: Diagnosis not present

## 2021-01-26 DIAGNOSIS — I2510 Atherosclerotic heart disease of native coronary artery without angina pectoris: Secondary | ICD-10-CM | POA: Diagnosis not present

## 2021-01-26 DIAGNOSIS — M4184 Other forms of scoliosis, thoracic region: Secondary | ICD-10-CM | POA: Diagnosis not present

## 2021-02-13 ENCOUNTER — Encounter: Payer: Self-pay | Admitting: Cardiology

## 2021-02-13 ENCOUNTER — Ambulatory Visit: Payer: Medicare PPO | Admitting: Cardiology

## 2021-02-13 ENCOUNTER — Other Ambulatory Visit: Payer: Self-pay

## 2021-02-13 VITALS — BP 112/60 | HR 64 | Ht 61.0 in | Wt 106.0 lb

## 2021-02-13 DIAGNOSIS — I428 Other cardiomyopathies: Secondary | ICD-10-CM | POA: Diagnosis not present

## 2021-02-13 DIAGNOSIS — I447 Left bundle-branch block, unspecified: Secondary | ICD-10-CM

## 2021-02-13 DIAGNOSIS — I10 Essential (primary) hypertension: Secondary | ICD-10-CM | POA: Diagnosis not present

## 2021-02-13 NOTE — Patient Instructions (Signed)
Medication Instructions:  The current medical regimen is effective;  continue present plan and medications.  *If you need a refill on your cardiac medications before your next appointment, please call your pharmacy*  Follow-Up: At [facility], you and your health needs are our priority.  As part of our continuing mission to provide you with exceptional heart care, we have created designated Provider Care Teams.  These Care Teams include your primary Cardiologist (physician) and Advanced Practice Providers (APPs -  Physician Assistants and Nurse Practitioners) who all work together to provide you with the care you need, when you need it.  We recommend signing up for the patient portal called "MyChart".  Sign up information is provided on this After Visit Summary.  MyChart is used to connect with patients for Virtual Visits (Telemedicine).  Patients are able to view lab/test results, encounter notes, upcoming appointments, etc.  Non-urgent messages can be sent to your provider as well.   To learn more about what you can do with MyChart, go to [email or web address].    Your next appointment:   12 month(s)  The format for your next appointment:   In Person  Provider:   Mark Skains, MD   Thank you for choosing [facility]!!      

## 2021-02-13 NOTE — Progress Notes (Signed)
Cardiology Office Note:    Date:  02/13/2021   ID:  [name], DOB 1929-11-05, MRN [id number]  PCP:  Prince Solian, MD   [place]  Cardiologist:  Candee Furbish, MD  Advanced Practice Provider:  No care team member to display Electrophysiologist:  None       Referring MD: Prince Solian, MD    History of Present Illness:    Kimberly Jordan is a 87 y.o. female here for the follow-up of cardiomyopathy.  Former [facility] [employer].  Feels well without any significant shortness of breath, no chest pain.  No syncope.  No fevers chills nausea vomiting.  Has been getting a lung nodule worked up with Dr. Servando Snare.  EF has been from 20 to 35% and first picked up during hospitalization in 2015 secondary to small bowel obstruction.  She had no obstructive coronary artery disease on cardiac catheterization.  Left bundle branch block noted.  No syncope.  She is going to the [facility].    Past Medical History:  Diagnosis Date  . Blepharitis   . Breast cancer ([place]) 1992   right breast   . Ectopic pregnancy   . Hypertension   . Osteoporosis   . 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 CATARACT EXTRACTION  2010, 2011   2010 right eye, 2011 left eye  . COLONOSCOPY W/ POLYPECTOMY    . cyst removed  2012   from right wrist  . ECTOPIC PREGNANCY SURGERY     salpingectomy  . HAND SURGERY  2007   right hand-ruptured tendon  . KNEE ARTHROSCOPY  1998  . LAPAROTOMY N/A 03/22/2014   Procedure: EXPLORATORY LAPAROTOMY;  Surgeon: Merrie Roof, MD;  Location: [facility];  Service: General;  Laterality: N/A;  . LEFT HEART CATHETERIZATION WITH CORONARY ANGIOGRAM N/A 03/25/2014   Procedure: LEFT HEART CATHETERIZATION WITH CORONARY ANGIOGRAM;  Surgeon: Jettie Booze, MD;  Location: [facility] CATH LAB;  Service: Cardiovascular;  Laterality: N/A;  . ORIF ELBOW FRACTURE  Left 10/15/2014   Procedure: OPEN REDUCTION INTERNAL FIXATION (ORIF) ELBOW/OLECRANON FRACTURE;  Surgeon: Renette Butters, MD;  Location: [place];  Service: Orthopedics;  Laterality: Left;  stryker elbow plate   . TOTAL ABDOMINAL HYSTERECTOMY W/ BILATERAL SALPINGOOPHORECTOMY  1994    Current Medications: Current Meds  Medication Sig  . amLODipine (NORVASC) 10 MG tablet Take 1 tablet by mouth daily.  Marland Kitchen aspirin 81 MG tablet Take 81 mg by mouth daily.  . Calcium Carbonate-Vitamin D (CALCIUM + D PO) Take 600 mg by mouth 3 (three) times daily.  . carvedilol (COREG) 12.5 MG tablet TAKE 1 TABLET TWICE DAILY WITH FOOD.  . clobetasol cream (TEMOVATE) 2.54 % Apply 1 application topically as needed (For itching on back).   . CREON 24000 units CPEP Take 3 capsules by mouth 3 (three) times daily. Takes 2- 24000 and 1- 36000 TID  . doxycycline (VIBRA-TABS) 100 MG tablet Take 100 mg by mouth every evening.   Marland Kitchen erythromycin ophthalmic ointment   . MYRBETRIQ 50 MG TB24 tablet   . OVER THE COUNTER MEDICATION Place 1 application into both eyes as needed. occu scrub  . simvastatin (ZOCOR) 20 MG tablet Take 20 mg by mouth every evening.   Marland Kitchen telmisartan (MICARDIS) 80 MG tablet Take 1 tablet (80 mg total) by mouth daily.  . zoledronic acid (RECLAST) 5 MG/100ML SOLN injection  Inject 5 mg into the vein once.     Allergies:   Patient has no known allergies.   Social History   Socioeconomic History  . Marital status: Married    Spouse name: Not on file  . Number of children: Not on file  . Years of education: Not on file  . Highest education level: Not on file  Occupational History  . Not on file  Tobacco Use  . Smoking status: Former Research scientist (life sciences)  . Smokeless tobacco: Never Used  Vaping Use  . Vaping Use: Never used  Substance and Sexual Activity  . Alcohol use: Yes    Alcohol/week: 5.0 standard drinks    Types: 5 Glasses of wine per week    Comment: wine daily  . Drug use: No  . Sexual activity: Not  Currently    Birth control/protection: Surgical    Comment: TAH/BSO  Other Topics Concern  . Not on file  Social History Narrative  . Not on file   Social Determinants of Health   Financial Resource Strain: Not on file  Food Insecurity: Not on file  Transportation Needs: Not on file  Physical Activity: Not on file  Stress: Not on file  Social Connections: Not on file     Family History: The patient's family history includes Arthritis in her mother; CVA in her father; Hypertension in her father and mother; Migraines in her father; Osteoporosis in her mother; Stroke in her father.  ROS:   Please see the history of present illness.     All other systems reviewed and are negative.  EKGs/Labs/Other Studies Reviewed:      EKG:  EKG is  ordered today.  The ekg ordered today demonstrates sinus rhythm 64 left bundle branch block  Recent Labs: No results found for requested labs within last 8760 hours.  Recent Lipid Panel No results found for: CHOL, TRIG, HDL, CHOLHDL, VLDL, LDLCALC, LDLDIRECT   LDL 78 hemoglobin 14.7 creatinine 0.7 ALT 20 platelets 163   Risk Assessment/Calculations:      Physical Exam:    VS:  BP 112/60 (BP Location: Left Arm, Patient Position: Sitting, Cuff Size: Normal)   Pulse 64   Ht 5\' 1"  (1.549 m)   Wt 106 lb (48.1 kg)   LMP 12/31/1992   SpO2 96%   BMI 20.03 kg/m     Wt Readings from Last 3 Encounters:  02/13/21 106 lb (48.1 kg)  01/26/21 109 lb 6.4 oz (49.6 kg)  08/24/20 106 lb (48.1 kg)     GEN:  Well nourished, well developed in no acute distress HEENT: Normal NECK: No JVD; No carotid bruits LYMPHATICS: No lymphadenopathy CARDIAC: RRR, no murmurs, rubs, gallops RESPIRATORY:  Clear to auscultation without rales, wheezing or rhonchi  ABDOMEN: Soft, non-tender, non-distended MUSCULOSKELETAL:  No edema; No deformity  SKIN: Warm and dry NEUROLOGIC:  Alert and oriented x 3 PSYCHIATRIC:  Normal affect   ASSESSMENT:    1. NICM  (nonischemic cardiomyopathy) ([place])   2. LBBB (left bundle branch block)   3. Essential hypertension    PLAN:    In order of problems listed above:  Nonischemic cardiomyopathy -Continue with carvedilol ARB symptoms have been fairly stable NYHA class I-II.  Walked with arthritic gait to clinic.  Left bundle branch block -No syncope.  Essential hypertension -Dr. Elsworth Soho has been monitoring.  Multidrug regimen as above.  Lung nodule -Dr. Everrett Coombe note reviewed.  Biopsy would be a significant risk for her.  Dr. Servando Snare is currently discussing  with radiation oncology about other options.   Medication Adjustments/Labs and Tests Ordered: Current medicines are reviewed at length with the patient today.  Concerns regarding medicines are outlined above.  Orders Placed This Encounter  Procedures  . EKG 12-Lead   No orders of the defined types were placed in this encounter.   Patient Instructions  Medication Instructions:  The current medical regimen is effective;  continue present plan and medications.  *If you need a refill on your cardiac medications before your next appointment, please call your pharmacy*  Follow-Up: At [facility], you and your health needs are our priority.  As part of our continuing mission to provide you with exceptional heart care, we have created designated Provider Care Teams.  These Care Teams include your primary Cardiologist (physician) and Advanced Practice Providers (APPs -  Physician Assistants and Nurse Practitioners) who all work together to provide you with the care you need, when you need it.  We recommend signing up for the patient portal called "MyChart".  Sign up information is provided on this After Visit Summary.  MyChart is used to connect with patients for Virtual Visits (Telemedicine).  Patients are able to view lab/test results, encounter notes, upcoming appointments, etc.  Non-urgent messages can be sent to your provider as well.   To  learn more about what you can do with MyChart, go to [email or web address].    Your next appointment:   12 month(s)  The format for your next appointment:   In Person  Provider:   Candee Furbish, MD   Thank you for choosing [facility]!!         Signed, Candee Furbish, MD  02/13/2021 1:59 PM    [place]

## 2021-02-21 ENCOUNTER — Encounter: Payer: Self-pay | Admitting: Podiatry

## 2021-02-21 ENCOUNTER — Ambulatory Visit (INDEPENDENT_AMBULATORY_CARE_PROVIDER_SITE_OTHER): Payer: Medicare PPO | Admitting: Podiatry

## 2021-02-21 ENCOUNTER — Other Ambulatory Visit: Payer: Self-pay

## 2021-02-21 DIAGNOSIS — M79675 Pain in left toe(s): Secondary | ICD-10-CM | POA: Diagnosis not present

## 2021-02-21 DIAGNOSIS — B351 Tinea unguium: Secondary | ICD-10-CM | POA: Diagnosis not present

## 2021-02-21 DIAGNOSIS — M79674 Pain in right toe(s): Secondary | ICD-10-CM | POA: Diagnosis not present

## 2021-02-21 NOTE — Progress Notes (Signed)
This patient returns to the office for evaluation and treatment of long thick painful nails .  This patient is unable to trim his own nails since the patient cannot reach his feet.  Patient says the nails are painful walking and wearing his shoes.  He returns for preventive foot care services.  General Appearance  Alert, conversant and in no acute stress.  Vascular  Dorsalis pedis and posterior tibial  pulses are weakly  palpable  bilaterally.  Capillary return is within normal limits  bilaterally. Cold feet  Bilaterally. Absent digital hair.  Neurologic  Senn-Weinstein monofilament wire test within normal limits  bilaterally. Muscle power within normal limits bilaterally.  Nails Thick disfigured discolored nails with subungual debris  from hallux to fifth toes bilaterally. No evidence of bacterial infection or drainage bilaterally.  Orthopedic  No limitations of motion  feet .  No crepitus or effusions noted.  No bony pathology or digital deformities noted.  Skin  normotropic skin with no porokeratosis noted bilaterally.  No signs of infections or ulcers noted.     Onychomycosis  Pain in toes right foot  Pain in toes left foot  Debridement  of nails  1-5  B/L with a nail nipper.  Nails were then filed using a dremel tool with no incidents.    RTC 4 months    Gardiner Barefoot DPM

## 2021-03-13 ENCOUNTER — Other Ambulatory Visit: Payer: Self-pay | Admitting: Cardiology

## 2021-04-04 NOTE — Progress Notes (Signed)
Location of Tumor / Histology: left lower lobe lung nodule  Patient presented to Emergency Room for fall on 10/14/2014. CT ordered for chest pain with deep breathing and a positive d-dimer showed lung nodule. Patient was referred to Dr Servando Snare when follow up imaging showed enlarging nodule.   Tobacco/Marijuana/Snuff/ETOH use:   Past/Anticipated interventions by cardiothoracic surgery, if any: per Dr Servando Snare on 01/26/2021:   Past/Anticipated interventions by medical oncology, if any: no  Signs/Symptoms  Weight changes, if any: no  Respiratory complaints, if any: yes, occasional cough, chronic sinusitis  Hemoptysis, if any: no  Pain issues, if any:  no  SAFETY ISSUES:  Prior radiation? yes, right breast  Pacemaker/ICD? no   Possible current pregnancy?no  Is the patient on methotrexate? no  Current Complaints / other details:  None  Vitals:   04/10/21 1239  BP: (!) 150/68  Pulse: 74  Resp: 18  Temp: 98.7 F (37.1 C)  SpO2: 96%  Weight: 106 lb (48.1 kg)  Height: 5' (1.524 m)

## 2021-04-10 ENCOUNTER — Ambulatory Visit
Admission: RE | Admit: 2021-04-10 | Discharge: 2021-04-10 | Disposition: A | Discharge status: Home / Self Care | Payer: Medicare PPO | Source: Ambulatory Visit | Attending: Radiation Oncology | Admitting: Radiation Oncology

## 2021-04-10 ENCOUNTER — Other Ambulatory Visit: Payer: Self-pay

## 2021-04-10 ENCOUNTER — Encounter: Payer: Self-pay | Admitting: Radiation Oncology

## 2021-04-10 VITALS — BP 150/68 | HR 74 | Temp 98.7°F | Resp 18 | Ht 60.0 in | Wt 106.0 lb

## 2021-04-10 DIAGNOSIS — Z87891 Personal history of nicotine dependence: Secondary | ICD-10-CM | POA: Insufficient documentation

## 2021-04-10 DIAGNOSIS — Z79899 Other long term (current) drug therapy: Secondary | ICD-10-CM | POA: Diagnosis not present

## 2021-04-10 DIAGNOSIS — I10 Essential (primary) hypertension: Secondary | ICD-10-CM | POA: Insufficient documentation

## 2021-04-10 DIAGNOSIS — R911 Solitary pulmonary nodule: Secondary | ICD-10-CM

## 2021-04-10 DIAGNOSIS — Z853 Personal history of malignant neoplasm of breast: Secondary | ICD-10-CM | POA: Diagnosis not present

## 2021-04-10 DIAGNOSIS — R918 Other nonspecific abnormal finding of lung field: Secondary | ICD-10-CM | POA: Insufficient documentation

## 2021-04-10 DIAGNOSIS — Z7982 Long term (current) use of aspirin: Secondary | ICD-10-CM | POA: Insufficient documentation

## 2021-04-10 DIAGNOSIS — Z923 Personal history of irradiation: Secondary | ICD-10-CM | POA: Diagnosis not present

## 2021-04-10 DIAGNOSIS — M810 Age-related osteoporosis without current pathological fracture: Secondary | ICD-10-CM | POA: Insufficient documentation

## 2021-04-10 NOTE — Progress Notes (Signed)
See Dr Kinard's note for nursing evaluation. 

## 2021-04-10 NOTE — Progress Notes (Signed)
Radiation Oncology         [phone] ________________________________  Initial Outpatient Consultation  Name: Kimberly Jordan MRN: [id number]  Date: 04/10/2021  DOB: Mar 23, 1929  WN:UUVO, Ravisankar, MD  Lajuana Matte, MD   REFERRING PHYSICIAN: Lajuana Matte, MD  DIAGNOSIS: The primary encounter diagnosis was Lung nodule. Diagnoses of History of breast cancer and Solitary pulmonary nodule were also pertinent to this visit.  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 female who is seen as a courtesy of Dr. Kipp Brood for an opinion concerning radiation therapy as part of management for her recently diagnosed lung cancer. Today, she is accompanied by no one. The patient underwent a CTA of chest on 11/09/2014 that showed a 15 mm ground-glass nodule in the left lower lobe. She underwent routine follow-up chest CT scans, in which the long nodule remained relatively stable and was noted to likely be a slow-growing low-grade adenocarcinoma.   Most recent chest CT scan on 01/26/2021 showed a stable appearance of the otherwise slowly growing nodule in the superior segment of the left lower lobe, probably low-grade adenocarcinoma. There was also noted to be a  sub-solid nodule in the right lower lobe, which was also probably low-grade adenocarcinoma but remained stable on recent scans. A third ground-glass density nodule was seen posteriorly in the right lower lobe and had been stable for years.   The patient was seen by Dr. Servando Snare, cardiothoracic surgeon, on 01/26/2021. The patient does not wish to have surgery, limiting biopsy options. She also has multiple co-morbidities, which puts her at increased risk of CT-guided biopsy. Patient was referred to radiation oncology for consideration of stereotactic radiation treatment of the enlarging lesion.  Of note, the patient has a history of right breast cancer in  1992 that was treated with lumpectomy.  PREVIOUS RADIATION THERAPY: Yes details are pending but the patient does report having radiation therapy for right breast cancer.  PAST MEDICAL HISTORY:  Past Medical History:  Diagnosis Date  . Blepharitis   . Breast cancer ([place]) 1992   right breast   . Ectopic pregnancy   . Hypertension   . Osteoporosis   . 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 CATARACT EXTRACTION  2010, 2011   2010 right eye, 2011 left eye  . COLONOSCOPY W/ POLYPECTOMY    . cyst removed  2012   from right wrist  . ECTOPIC PREGNANCY SURGERY     salpingectomy  . HAND SURGERY  2007   right hand-ruptured tendon  . KNEE ARTHROSCOPY  1998  . LAPAROTOMY N/A 03/22/2014   Procedure: EXPLORATORY LAPAROTOMY;  Surgeon: Merrie Roof, MD;  Location: [facility];  Service: General;  Laterality: N/A;  . LEFT HEART CATHETERIZATION WITH CORONARY ANGIOGRAM N/A 03/25/2014   Procedure: LEFT HEART CATHETERIZATION WITH CORONARY ANGIOGRAM;  Surgeon: Jettie Booze, MD;  Location: [facility] CATH LAB;  Service: Cardiovascular;  Laterality: N/A;  . ORIF ELBOW FRACTURE Left 10/15/2014   Procedure: OPEN REDUCTION INTERNAL FIXATION (ORIF) ELBOW/OLECRANON FRACTURE;  Surgeon: Renette Butters, MD;  Location: [place];  Service: Orthopedics;  Laterality: Left;  stryker elbow plate   . TOTAL ABDOMINAL HYSTERECTOMY W/ BILATERAL SALPINGOOPHORECTOMY  1994    FAMILY HISTORY:  Family History  Problem Relation Age of Onset  . Osteoporosis Mother   . Arthritis Mother   .  Hypertension Mother   . CVA Father   . Hypertension Father   . Stroke Father   . Migraines Father     SOCIAL HISTORY:  Social History   Tobacco Use  . Smoking status: Former Research scientist (life sciences)  . Smokeless tobacco: Never Used  Vaping Use  . Vaping Use: Never used  Substance Use Topics  . Alcohol use: Yes    Alcohol/week: 5.0 standard  drinks    Types: 5 Glasses of wine per week    Comment: wine daily  . Drug use: No    ALLERGIES: No Known Allergies  MEDICATIONS:  Current Outpatient Medications  Medication Sig Dispense Refill  . amLODipine (NORVASC) 10 MG tablet Take 1 tablet by mouth daily.    Marland Kitchen aspirin 81 MG tablet Take 81 mg by mouth daily.    . Calcium Carbonate-Vitamin D (CALCIUM + D PO) Take 600 mg by mouth 3 (three) times daily.    . carvedilol (COREG) 12.5 MG tablet TAKE 1 TABLET TWICE DAILY WITH FOOD. 180 tablet 3  . clobetasol cream (TEMOVATE) 0.98 % Apply 1 application topically as needed (For itching on back).     . CREON 24000 units CPEP Take 3 capsules by mouth 3 (three) times daily. Takes 2- 24000 and 1- 36000 TID    . doxycycline (VIBRA-TABS) 100 MG tablet Take 100 mg by mouth every evening.     Marland Kitchen erythromycin ophthalmic ointment     . MYRBETRIQ 50 MG TB24 tablet     . OVER THE COUNTER MEDICATION Place 1 application into both eyes as needed. occu scrub    . simvastatin (ZOCOR) 20 MG tablet Take 20 mg by mouth every evening.     Marland Kitchen telmisartan (MICARDIS) 80 MG tablet Take 1 tablet (80 mg total) by mouth daily. 90 tablet 1  . zoledronic acid (RECLAST) 5 MG/100ML SOLN injection Inject 5 mg into the vein once.     No current facility-administered medications for this encounter.    REVIEW OF SYSTEMS:  A 10+ POINT REVIEW OF SYSTEMS WAS OBTAINED including neurology, dermatology, psychiatry, cardiac, respiratory, lymph, extremities, GI, GU, musculoskeletal, constitutional, reproductive, HEENT.  She denies any pain within the chest area significant cough or hemoptysis.  Breathing has been stable over the past few years.   PHYSICAL EXAM:  height is 5' (1.524 m) and weight is 106 lb (48.1 kg). Her temperature is 98.7 F (37.1 C). Her blood pressure is 150/68 (abnormal) and her pulse is 74. Her respiration is 18 and oxygen saturation is 96%.   General: Alert and oriented, in no acute distress HEENT: Head is  normocephalic. Extraocular movements are intact. . Neck: Neck is supple, no palpable cervical or supraclavicular lymphadenopathy. Heart: Regular in rate and rhythm with no murmurs, rubs, or gallops. Chest: Clear to auscultation bilaterally, with no rhonchi, wheezes, or rales. Abdomen: Soft, nontender, nondistended, with no rigidity or guarding. Extremities: No cyanosis or edema. Lymphatics: see Neck Exam Skin: No concerning lesions. Musculoskeletal: symmetric strength and muscle tone throughout. Neurologic: Cranial nerves II through XII are grossly intact. No obvious focalities. Speech is fluent. Coordination is intact. Psychiatric: Judgment and insight are intact. Affect is appropriate.   ECOG = 1  0 - Asymptomatic (Fully active, able to carry on all predisease activities without restriction)  1 - Symptomatic but completely ambulatory (Restricted in physically strenuous activity but ambulatory and able to carry out work of a light or sedentary nature. For example, light housework, office work)  2 - Symptomatic, <50%  in bed during the day (Ambulatory and capable of all self care but unable to carry out any work activities. Up and about more than 50% of waking hours)  3 - Symptomatic, >50% in bed, but not bedbound (Capable of only limited self-care, confined to bed or chair 50% or more of waking hours)  4 - Bedbound (Completely disabled. Cannot carry on any self-care. Totally confined to bed or chair)  5 - Death   Eustace Pen MM, Creech RH, Tormey DC, et al. [phone]). "Toxicity and response criteria of the [facility]". [place] Oncol. 5 (6): 649-55  LABORATORY DATA:  Lab Results  Component Value Date   WBC 7.4 10/15/2014   HGB 11.8 (L) 10/15/2014   HCT 34.0 (L) 10/15/2014   MCV 96.3 10/15/2014   PLT 144 (L) 10/15/2014   NEUTROABS 7.0 10/14/2014   Lab Results  Component Value Date   NA 137 11/22/2015   K 4.0 11/22/2015   CL 99 11/22/2015   CO2 28  11/22/2015   GLUCOSE 90 11/22/2015   CREATININE 0.77 11/22/2015   CALCIUM 9.3 11/22/2015      RADIOGRAPHY: No results found.    IMPRESSION: left lower lobe lung nodule, suspected low-grade adenocarcinoma  I spoke in detail with patient about options for management.  We discussed that given the slow-growing nature of the lesion in the left lower lung that she could continue to follow this area.  The other option would be to proceed with a definitive course of radiation therapy without tissue diagnosis.  I discussed potential biopsy and again the patient does not wish to consider this given her significant risk of complications.  Today, I talked to the patient  about the findings and work-up thus far.  We discussed the natural history of lung cancer and general treatment, highlighting the role of radiotherapy (SBRT) in the management.  We discussed the available radiation techniques, and focused on the details of logistics and delivery.  We reviewed the anticipated acute and late sequelae associated with radiation in this setting.  The patient was encouraged to ask questions that I answered to the best of my ability.  At this point she has decided to proceed with a definitive course of radiation therapy.  A patient consent form was discussed and signed.  We retained a copy for our records.  The patient would like to proceed with radiation and will be scheduled for CT simulation.  PLAN: Return later this week for CT simulation.  Anticipate 3 SBRT treatments directed at the left lower lobe pulmonary nodule.  Total time spent in this encounter was 60 minutes which included reviewing the patient's most recent chest CT scans, follow-ups, physical examination, and documentation.   ------------------------------------------------  Blair Promise, PhD, MD  This document serves as a record of services personally performed by Gery Pray, MD. It was created on his behalf by Clerance Lav, a trained  medical scribe. The creation of this record is based on the scribe's personal observations and the provider's statements to them. This document has been checked and approved by the attending provider.

## 2021-04-11 ENCOUNTER — Ambulatory Visit
Admission: RE | Admit: 2021-04-11 | Discharge: 2021-04-11 | Disposition: A | Discharge status: Home / Self Care | Payer: Medicare PPO | Source: Ambulatory Visit | Attending: Radiation Oncology | Admitting: Radiation Oncology

## 2021-04-11 DIAGNOSIS — Z87891 Personal history of nicotine dependence: Secondary | ICD-10-CM | POA: Diagnosis not present

## 2021-04-11 DIAGNOSIS — Z510 Encounter for antineoplastic radiation therapy: Secondary | ICD-10-CM | POA: Insufficient documentation

## 2021-04-11 DIAGNOSIS — R911 Solitary pulmonary nodule: Secondary | ICD-10-CM | POA: Diagnosis not present

## 2021-04-11 DIAGNOSIS — Z853 Personal history of malignant neoplasm of breast: Secondary | ICD-10-CM | POA: Diagnosis not present

## 2021-04-11 DIAGNOSIS — R918 Other nonspecific abnormal finding of lung field: Secondary | ICD-10-CM | POA: Insufficient documentation

## 2021-04-12 DIAGNOSIS — H02051 Trichiasis without entropian right upper eyelid: Secondary | ICD-10-CM | POA: Diagnosis not present

## 2021-04-12 DIAGNOSIS — H353132 Nonexudative age-related macular degeneration, bilateral, intermediate dry stage: Secondary | ICD-10-CM | POA: Diagnosis not present

## 2021-04-12 DIAGNOSIS — H02054 Trichiasis without entropian left upper eyelid: Secondary | ICD-10-CM | POA: Diagnosis not present

## 2021-04-12 DIAGNOSIS — H0100A Unspecified blepharitis right eye, upper and lower eyelids: Secondary | ICD-10-CM | POA: Diagnosis not present

## 2021-04-15 DIAGNOSIS — Z87891 Personal history of nicotine dependence: Secondary | ICD-10-CM | POA: Diagnosis not present

## 2021-04-15 DIAGNOSIS — Z853 Personal history of malignant neoplasm of breast: Secondary | ICD-10-CM | POA: Diagnosis not present

## 2021-04-15 DIAGNOSIS — R911 Solitary pulmonary nodule: Secondary | ICD-10-CM | POA: Diagnosis not present

## 2021-04-15 DIAGNOSIS — R918 Other nonspecific abnormal finding of lung field: Secondary | ICD-10-CM | POA: Diagnosis not present

## 2021-04-15 DIAGNOSIS — Z510 Encounter for antineoplastic radiation therapy: Secondary | ICD-10-CM | POA: Diagnosis not present

## 2021-04-18 ENCOUNTER — Other Ambulatory Visit: Payer: Self-pay

## 2021-04-18 ENCOUNTER — Ambulatory Visit
Admission: RE | Admit: 2021-04-18 | Discharge: 2021-04-18 | Disposition: A | Discharge status: Home / Self Care | Payer: Medicare PPO | Source: Ambulatory Visit | Attending: Radiation Oncology | Admitting: Radiation Oncology

## 2021-04-18 DIAGNOSIS — R911 Solitary pulmonary nodule: Secondary | ICD-10-CM

## 2021-04-18 DIAGNOSIS — R918 Other nonspecific abnormal finding of lung field: Secondary | ICD-10-CM | POA: Diagnosis not present

## 2021-04-18 DIAGNOSIS — Z853 Personal history of malignant neoplasm of breast: Secondary | ICD-10-CM | POA: Diagnosis not present

## 2021-04-18 DIAGNOSIS — Z510 Encounter for antineoplastic radiation therapy: Secondary | ICD-10-CM | POA: Diagnosis not present

## 2021-04-19 ENCOUNTER — Ambulatory Visit: Payer: Medicare PPO | Admitting: Radiation Oncology

## 2021-04-20 ENCOUNTER — Other Ambulatory Visit: Payer: Self-pay

## 2021-04-20 ENCOUNTER — Ambulatory Visit
Admission: RE | Admit: 2021-04-20 | Discharge: 2021-04-20 | Disposition: A | Discharge status: Home / Self Care | Payer: Medicare PPO | Source: Ambulatory Visit | Attending: Radiation Oncology | Admitting: Radiation Oncology

## 2021-04-20 DIAGNOSIS — Z853 Personal history of malignant neoplasm of breast: Secondary | ICD-10-CM | POA: Diagnosis not present

## 2021-04-20 DIAGNOSIS — Z510 Encounter for antineoplastic radiation therapy: Secondary | ICD-10-CM | POA: Diagnosis not present

## 2021-04-20 DIAGNOSIS — R918 Other nonspecific abnormal finding of lung field: Secondary | ICD-10-CM | POA: Diagnosis not present

## 2021-04-25 ENCOUNTER — Ambulatory Visit
Admission: RE | Admit: 2021-04-25 | Discharge: 2021-04-25 | Disposition: A | Discharge status: Home / Self Care | Payer: Medicare PPO | Source: Ambulatory Visit | Attending: Radiation Oncology | Admitting: Radiation Oncology

## 2021-04-25 ENCOUNTER — Other Ambulatory Visit: Payer: Self-pay

## 2021-04-25 ENCOUNTER — Encounter: Payer: Self-pay | Admitting: Radiation Oncology

## 2021-04-25 DIAGNOSIS — R918 Other nonspecific abnormal finding of lung field: Secondary | ICD-10-CM | POA: Diagnosis not present

## 2021-04-25 DIAGNOSIS — Z87891 Personal history of nicotine dependence: Secondary | ICD-10-CM | POA: Diagnosis not present

## 2021-04-25 DIAGNOSIS — R911 Solitary pulmonary nodule: Secondary | ICD-10-CM | POA: Diagnosis not present

## 2021-04-25 DIAGNOSIS — Z510 Encounter for antineoplastic radiation therapy: Secondary | ICD-10-CM | POA: Diagnosis not present

## 2021-04-25 DIAGNOSIS — Z853 Personal history of malignant neoplasm of breast: Secondary | ICD-10-CM | POA: Diagnosis not present

## 2021-05-02 ENCOUNTER — Encounter: Payer: Self-pay | Admitting: Podiatry

## 2021-05-02 ENCOUNTER — Other Ambulatory Visit: Payer: Self-pay

## 2021-05-02 ENCOUNTER — Ambulatory Visit: Payer: Medicare PPO | Admitting: Podiatry

## 2021-05-02 DIAGNOSIS — M79674 Pain in right toe(s): Secondary | ICD-10-CM

## 2021-05-02 DIAGNOSIS — L600 Ingrowing nail: Secondary | ICD-10-CM | POA: Diagnosis not present

## 2021-05-02 DIAGNOSIS — M79675 Pain in left toe(s): Secondary | ICD-10-CM

## 2021-05-02 DIAGNOSIS — B351 Tinea unguium: Secondary | ICD-10-CM

## 2021-05-02 NOTE — Progress Notes (Signed)
This patient returns to the office for evaluation and treatment of long thick painful nails .  This patient is unable to trim his own nails since the patient cannot reach his feet.  Patient says the nails are painful walking and wearing his shoes.  He returns for preventive foot care services.  General Appearance  Alert, conversant and in no acute stress.  Vascular  Dorsalis pedis and posterior tibial  pulses are weakly  palpable  bilaterally.  Capillary return is within normal limits  bilaterally. Cold feet  Bilaterally. Absent digital hair.  Neurologic  Senn-Weinstein monofilament wire test within normal limits  bilaterally. Muscle power within normal limits bilaterally.  Nails Thick disfigured discolored nails with subungual debris  from hallux to fifth toes bilaterally. No evidence of bacterial infection or drainage bilaterally.  Orthopedic  No limitations of motion  feet .  No crepitus or effusions noted.  No bony pathology or digital deformities noted.  Skin  normotropic skin with no porokeratosis noted bilaterally.  No signs of infections or ulcers noted.     Onychomycosis  Pain in toes right foot  Pain in toes left foot  Debridement  of nails  1-5  B/L with a nail nipper.  Nails were then filed using a dremel tool with no incidents.    RTC 3 months    Gardiner Barefoot DPM

## 2021-05-17 ENCOUNTER — Encounter: Payer: Self-pay | Admitting: Radiation Oncology

## 2021-05-24 NOTE — Progress Notes (Signed)
Radiation Oncology         [phone] ________________________________  Name: Kimberly Jordan MRN: [id number]  Date: 05/25/2021  DOB: 11-08-1929  Follow-Up Visit Note  CC: Prince Solian, MD  Lajuana Matte, MD  Diagnosis:   The primary encounter diagnosis was Lung nodule. Diagnoses of History of breast cancer and Solitary pulmonary nodule were also pertinent to this visit.  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  The patient was last seen for consultation on 04/10/2021. Since then, the patient completed SBRT treatment on 04/25/21 and tolerated the treatment quite well without any issues.    She continues to do well.  Her only side effect possibly was fatigue from her radiation therapy.  She denies any significant coughing weakness of breath or chest pain.                            Allergies:  has No Known Allergies.  Meds: Current Outpatient Medications  Medication Sig Dispense Refill  . amLODipine (NORVASC) 10 MG tablet Take 1 tablet by mouth daily.    Marland Kitchen aspirin 81 MG tablet Take 81 mg by mouth daily.    . Calcium Carbonate-Vitamin D (CALCIUM + D PO) Take 600 mg by mouth 3 (three) times daily.    . carvedilol (COREG) 12.5 MG tablet TAKE 1 TABLET TWICE DAILY WITH FOOD. 180 tablet 3  . clobetasol cream (TEMOVATE) 8.58 % Apply 1 application topically as needed (For itching on back).     . CREON 24000 units CPEP Take 3 capsules by mouth 3 (three) times daily. Takes 2- 24000 and 1- 36000 TID    . doxycycline (VIBRA-TABS) 100 MG tablet Take 100 mg by mouth every evening.     Marland Kitchen erythromycin ophthalmic ointment     . MYRBETRIQ 50 MG TB24 tablet     . OVER THE COUNTER MEDICATION Place 1 application into both eyes as needed. occu scrub    . simvastatin (ZOCOR) 20 MG tablet Take 20 mg by mouth  every evening.     Marland Kitchen telmisartan (MICARDIS) 80 MG tablet Take 1 tablet (80 mg total) by mouth daily. 90 tablet 1  . zoledronic acid (RECLAST) 5 MG/100ML SOLN injection Inject 5 mg into the vein once.     No current facility-administered medications for this encounter.    Physical Findings: The patient is in no acute distress. Patient is alert and oriented.  height is 5' (1.524 m) and weight is 105 lb (47.6 kg). Her temporal temperature is 97.8 F (36.6 C). Her blood pressure is 144/63 (abnormal) and her pulse is 68. Her respiration is 18 and oxygen saturation is 96%. .   Lungs are clear to auscultation bilaterally. Heart has regular rate and rhythm. No palpable cervical, supraclavicular, or axillary adenopathy. Abdomen soft, non-tender, normal bowel sounds.   Lab Findings: Lab Results  Component Value Date   WBC 7.4 10/15/2014   HGB 11.8 (L) 10/15/2014   HCT 34.0 (L) 10/15/2014   MCV 96.3 10/15/2014   PLT 144 (L) 10/15/2014    Radiographic Findings: No results found.  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  She tolerated this quite well without any significant side effects.  Plan: The patient will return for follow-up in 3 months.  Prior to this follow-up visit the patient will undergo a chest CT scan to assess her response to SBRT treatments.    ____________________________________  Blair Promise, PhD, MD   This document serves as a record of services personally performed by Gery Pray, MD. It was created on his behalf by Roney Mans, a trained medical scribe. The creation of this record is based on the scribe's personal observations and the provider's statements to them. This document has been checked and approved by the attending provider.

## 2021-05-25 ENCOUNTER — Encounter: Payer: Self-pay | Admitting: Radiation Oncology

## 2021-05-25 ENCOUNTER — Other Ambulatory Visit: Payer: Self-pay

## 2021-05-25 ENCOUNTER — Ambulatory Visit
Admission: RE | Admit: 2021-05-25 | Discharge: 2021-05-25 | Disposition: A | Discharge status: Home / Self Care | Payer: Medicare PPO | Source: Ambulatory Visit | Attending: Radiation Oncology | Admitting: Radiation Oncology

## 2021-05-25 DIAGNOSIS — Z7982 Long term (current) use of aspirin: Secondary | ICD-10-CM | POA: Insufficient documentation

## 2021-05-25 DIAGNOSIS — R911 Solitary pulmonary nodule: Secondary | ICD-10-CM | POA: Insufficient documentation

## 2021-05-25 DIAGNOSIS — Z923 Personal history of irradiation: Secondary | ICD-10-CM | POA: Diagnosis not present

## 2021-05-25 DIAGNOSIS — Z79899 Other long term (current) drug therapy: Secondary | ICD-10-CM | POA: Diagnosis not present

## 2021-05-25 DIAGNOSIS — Z853 Personal history of malignant neoplasm of breast: Secondary | ICD-10-CM | POA: Insufficient documentation

## 2021-05-25 HISTORY — DX: Personal history of irradiation: Z92.3

## 2021-05-25 NOTE — Progress Notes (Signed)
Kimberly Jordan is here today for follow up post radiation to the lung.  Lung Side: left  Does the patient complain of any of the following: . Pain:no . Shortness of breath w/wo exertion: Patient reports shortness on breath on exertion. . Cough: no . Hemoptysis:no . Pain with swallowing: no . Swallowing/choking concerns: no . Appetite: good . Energy Level: Patient reports having a good energy level. Marland Kitchen Post radiation skin Changes: skin intact.    Additional comments if applicable: Vitals:   34/03/52 1634  BP: (!) 144/63  Pulse: 68  Resp: 18  Temp: 97.8 F (36.6 C)  TempSrc: Temporal  SpO2: 96%  Weight: 105 lb (47.6 kg)  Height: 5' (1.524 m)

## 2021-05-31 NOTE — Progress Notes (Incomplete)
Patient Name: Kimberly Jordan MRN: [id number] DOB: 1929-04-14 Referring Physician: Melodie Bouillon Date of Service: 04/25/2021 [facility], [place]                     End Of Treatment Note  Diagnoses: R91.8-Other nonspecific abnormal finding of lung field  Cancer Staging: ***  Intent: Curative  Radiation Treatment Dates: 04/18/2021 through 04/25/2021 Site Technique Total Dose (Gy) Dose per Fx (Gy) Completed Fx Beam Energies  Lung, Left: Lung_Lt IMRT 54/54 18 3/3 6XFFF   Narrative: The patient tolerated radiation therapy quite well and has completed her SBRT treatment. She denies having any pain, cough, shortness of breath, skin issues or swallowing issues.  Plan: The patient will follow-up with radiation oncology in one month and is planned to receive an initial CT in 4 months .  ________________________________________________ -----------------------------------  Blair Promise, PhD, MD  This document serves as a record of services personally performed by Gery Pray, MD. It was created on his behalf by Roney Mans, a trained medical scribe. The creation of this record is based on the scribe's personal observations and the provider's statements to them. This document has been checked and approved by the attending provider.

## 2021-07-20 DIAGNOSIS — E785 Hyperlipidemia, unspecified: Secondary | ICD-10-CM | POA: Diagnosis not present

## 2021-07-20 DIAGNOSIS — R7301 Impaired fasting glucose: Secondary | ICD-10-CM | POA: Diagnosis not present

## 2021-07-20 DIAGNOSIS — M810 Age-related osteoporosis without current pathological fracture: Secondary | ICD-10-CM | POA: Diagnosis not present

## 2021-07-27 DIAGNOSIS — Z23 Encounter for immunization: Secondary | ICD-10-CM | POA: Diagnosis not present

## 2021-07-27 DIAGNOSIS — R7301 Impaired fasting glucose: Secondary | ICD-10-CM | POA: Diagnosis not present

## 2021-07-27 DIAGNOSIS — K439 Ventral hernia without obstruction or gangrene: Secondary | ICD-10-CM | POA: Diagnosis not present

## 2021-07-27 DIAGNOSIS — I10 Essential (primary) hypertension: Secondary | ICD-10-CM | POA: Diagnosis not present

## 2021-07-27 DIAGNOSIS — R82998 Other abnormal findings in urine: Secondary | ICD-10-CM | POA: Diagnosis not present

## 2021-07-27 DIAGNOSIS — M810 Age-related osteoporosis without current pathological fracture: Secondary | ICD-10-CM | POA: Diagnosis not present

## 2021-07-27 DIAGNOSIS — M1990 Unspecified osteoarthritis, unspecified site: Secondary | ICD-10-CM | POA: Diagnosis not present

## 2021-07-27 DIAGNOSIS — I428 Other cardiomyopathies: Secondary | ICD-10-CM | POA: Diagnosis not present

## 2021-07-27 DIAGNOSIS — D692 Other nonthrombocytopenic purpura: Secondary | ICD-10-CM | POA: Diagnosis not present

## 2021-07-27 DIAGNOSIS — Z0000 Encounter for general adult medical examination without abnormal findings: Secondary | ICD-10-CM | POA: Diagnosis not present

## 2021-08-02 ENCOUNTER — Encounter: Payer: Self-pay | Admitting: Podiatry

## 2021-08-02 ENCOUNTER — Ambulatory Visit: Payer: Medicare PPO | Admitting: Podiatry

## 2021-08-02 ENCOUNTER — Other Ambulatory Visit: Payer: Self-pay

## 2021-08-02 DIAGNOSIS — M79674 Pain in right toe(s): Secondary | ICD-10-CM | POA: Diagnosis not present

## 2021-08-02 DIAGNOSIS — B351 Tinea unguium: Secondary | ICD-10-CM | POA: Diagnosis not present

## 2021-08-02 DIAGNOSIS — M79675 Pain in left toe(s): Secondary | ICD-10-CM | POA: Diagnosis not present

## 2021-08-02 DIAGNOSIS — L600 Ingrowing nail: Secondary | ICD-10-CM

## 2021-08-02 NOTE — Progress Notes (Addendum)
This patient returns to the office for evaluation and treatment of long thick painful nails .  This patient is unable to trim his own nails since the patient cannot reach his feet.  Patient says the nails are painful walking and wearing his shoes.  He returns for preventive foot care services.  General Appearance  Alert, conversant and in no acute stress.  Vascular  Dorsalis pedis and posterior tibial  pulses are weakly  palpable  bilaterally.  Capillary return is within normal limits  bilaterally. Cold feet  Bilaterally. Absent digital hair.  Neurologic  Senn-Weinstein monofilament wire test within normal limits  bilaterally. Muscle power within normal limits bilaterally.  Nails Thick disfigured discolored nails with subungual debris  from hallux to fifth toes bilaterally. No evidence of bacterial infection or drainage bilaterally.  Orthopedic  No limitations of motion  feet .  No crepitus or effusions noted.  Hammer toes second  left.  HAV 1st MPJ  B/L.  Skin  normotropic skin with no porokeratosis noted bilaterally.  No signs of infections or ulcers noted.     Onychomycosis  Pain in toes right foot  Pain in toes left foot  Debridement  of nails  1-5  B/L with a nail nipper.  Nails were then filed using a dremel tool with no incidents.    RTC 3 months    Makahla Kiser DPM  

## 2021-08-15 DIAGNOSIS — C44319 Basal cell carcinoma of skin of other parts of face: Secondary | ICD-10-CM | POA: Diagnosis not present

## 2021-08-15 DIAGNOSIS — L570 Actinic keratosis: Secondary | ICD-10-CM | POA: Diagnosis not present

## 2021-08-15 DIAGNOSIS — Z85828 Personal history of other malignant neoplasm of skin: Secondary | ICD-10-CM | POA: Diagnosis not present

## 2021-08-15 DIAGNOSIS — L720 Epidermal cyst: Secondary | ICD-10-CM | POA: Diagnosis not present

## 2021-08-15 DIAGNOSIS — D485 Neoplasm of uncertain behavior of skin: Secondary | ICD-10-CM | POA: Diagnosis not present

## 2021-08-15 DIAGNOSIS — L821 Other seborrheic keratosis: Secondary | ICD-10-CM | POA: Diagnosis not present

## 2021-08-15 DIAGNOSIS — L565 Disseminated superficial actinic porokeratosis (DSAP): Secondary | ICD-10-CM | POA: Diagnosis not present

## 2021-08-22 ENCOUNTER — Other Ambulatory Visit: Payer: Self-pay

## 2021-08-22 ENCOUNTER — Ambulatory Visit (HOSPITAL_COMMUNITY)
Admission: RE | Admit: 2021-08-22 | Discharge: 2021-08-22 | Disposition: A | Discharge status: Home / Self Care | Payer: Medicare PPO | Source: Ambulatory Visit | Attending: Radiation Oncology | Admitting: Radiation Oncology

## 2021-08-22 DIAGNOSIS — J439 Emphysema, unspecified: Secondary | ICD-10-CM | POA: Diagnosis not present

## 2021-08-22 DIAGNOSIS — R911 Solitary pulmonary nodule: Secondary | ICD-10-CM | POA: Diagnosis not present

## 2021-08-22 DIAGNOSIS — I700 Atherosclerosis of aorta: Secondary | ICD-10-CM | POA: Diagnosis not present

## 2021-08-22 DIAGNOSIS — C3490 Malignant neoplasm of unspecified part of unspecified bronchus or lung: Secondary | ICD-10-CM | POA: Diagnosis not present

## 2021-08-26 NOTE — Progress Notes (Signed)
Radiation Oncology         [phone] ________________________________  Name: Kimberly Jordan MRN: [id number]  Date: 08/28/2021  DOB: 11/26/1929  Follow-Up Visit Note  CC: Prince Solian, MD  Lajuana Matte, MD    ICD-10-CM   1. Solitary pulmonary nodule  R91.1 CT CHEST WO CONTRAST      Diagnosis: The primary encounter diagnosis was Lung nodule. Diagnoses of History of breast cancer and Solitary pulmonary nodule were also pertinent to this visit.   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 Chest CT taken on 08/22/21 demonstrated interval decrease in size of the superior segment left lower lobe pulmonary nodule, with new adjacent 6 mm nodular opacity noted as likely treatment related. Findings also revealed enlargement of the pulmonary outflow tract and main pulmonary arteries suggestive of pulmonary arterial hypertension. Of note: interval development of mild bilateral hydronephrosis was visualized as well, (incompletely visualized).                             Allergies:  has No Known Allergies.  Meds: Current Outpatient Medications  Medication Sig Dispense Refill   amLODipine (NORVASC) 10 MG tablet Take 1 tablet by mouth daily.     aspirin 81 MG tablet Take 81 mg by mouth daily.     Calcium Carbonate-Vitamin D (CALCIUM + D PO) Take 600 mg by mouth 3 (three) times daily.     carvedilol (COREG) 12.5 MG tablet TAKE 1 TABLET TWICE DAILY WITH FOOD. 180 tablet 3   clobetasol cream (TEMOVATE) [id number] % Apply 1 application topically as needed (For itching on back).      CREON 24000 units CPEP Take 3 capsules by mouth 3 (three) times daily. Takes 2- 24000 and 1- 36000 TID     doxycycline (VIBRA-TABS) 100 MG tablet Take 100 mg by mouth every evening.       erythromycin ophthalmic ointment      MYRBETRIQ 50 MG TB24 tablet      simvastatin (ZOCOR) 20 MG tablet Take 20 mg by mouth every evening.      telmisartan (MICARDIS) 80 MG tablet Take 1 tablet (80 mg total) by mouth daily. 90 tablet 1   OVER THE COUNTER MEDICATION Place 1 application into both eyes as needed. occu scrub (Patient not taking: Reported on 08/28/2021)     zoledronic acid (RECLAST) 5 MG/100ML SOLN injection Inject 5 mg into the vein once. Yearly medication,  given in Jan.     No current facility-administered medications for this encounter.    Physical Findings: The patient is in no acute distress. Patient is alert and oriented.  height is 5' (1.524 m) and weight is 103 lb 2 oz (46.8 kg). Her temporal temperature is 97.2 F (36.2 C) (abnormal). Her blood pressure is 131/62 and her pulse is 62. Her respiration is 18 and oxygen saturation is 97%. .  No significant changes. Lungs are clear to auscultation bilaterally. Heart has regular rate and rhythm. No palpable cervical, supraclavicular, or axillary adenopathy. Abdomen soft, non-tender, normal bowel sounds.    Lab Findings: Lab Results  Component Value Date   WBC 7.4 10/15/2014   HGB 11.8 (L) 10/15/2014   HCT 34.0 (L) 10/15/2014   MCV 96.3 10/15/2014  PLT 144 (L) 10/15/2014    Radiographic Findings: CT CHEST WO CONTRAST  Result Date: 08/23/2021 CLINICAL DATA:  Non-small-cell lung cancer.  Restaging. EXAM: CT CHEST WITHOUT CONTRAST TECHNIQUE: Multidetector CT imaging of the chest was performed following the standard protocol without IV contrast. COMPARISON:  01/26/2021 FINDINGS: Cardiovascular: The heart size is normal. No substantial pericardial effusion. Coronary artery calcification is evident. Mitral annular calcification evident. Moderate atherosclerotic calcification is noted in the wall of the thoracic aorta. Enlargement of the pulmonary outflow tract and main pulmonary arteries suggests pulmonary arterial hypertension.  Mediastinum/Nodes: No mediastinal lymphadenopathy. No evidence for gross hilar lymphadenopathy although assessment is limited by the lack of intravenous contrast on today's study. The esophagus has normal imaging features. There is no axillary lymphadenopathy. Lungs/Pleura: Centrilobular and paraseptal emphysema evident. 1.5 cm lesion identified in the superior segment left lower lobe previously is 1.1 x 1.1 cm today. A new adjacent nodular opacity measures 6 mm and is likely atelectatic or treatment related. Otherwise no new suspicious pulmonary nodule or mass. No focal airspace consolidation. No pleural effusion. Upper Abdomen: Tiny hypodensities in the liver are stable, likely benign. There is mild fullness of both intrarenal collecting systems, incompletely visualized. Musculoskeletal: No worrisome lytic or sclerotic osseous abnormality. IMPRESSION: 1. Interval decrease in size of the superior segment left lower lobe pulmonary nodule with new adjacent 6 mm nodular opacity, likely atelectatic or treatment related. 2. Interval development of mild bilateral hydronephrosis, incompletely visualized. Renal ultrasound could be used to further evaluate as clinically warranted. 3. Enlargement of the pulmonary outflow tract and main pulmonary arteries suggests pulmonary arterial hypertension. 4. Aortic Atherosclerosis (ICD10-I70.0) and Emphysema (ICD10-J43.9). Electronically Signed   By: Misty Stanley M.D.   On: 08/23/2021 14:26    Impression:  The primary encounter diagnosis was Lung nodule. Diagnoses of History of breast cancer and Solitary pulmonary nodule were also pertinent to this visit.   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  Recent chest CT scan shows favorable response to her SBRT treatment.  Plan: The patient will be scheduled for CT scan of the chest in 6 months for follow-up of her pulmonary nodule.  I will see her soon  afterward for exam and to review results of the scan.  Patient's recent chest CT scan showed new development of bilateral hydronephrosis.  We will have her set up to see her primary care physician Dr. Dagmar Hait for evaluation of this issue   20 minutes of total time was spent for this patient encounter, including preparation, face-to-face counseling with the patient and coordination of care, physical exam, and documentation of the encounter and ordering of upcoming chest CT scan. ____________________________________  Blair Promise, PhD, MD   This document serves as a record of services personally performed by Gery Pray, MD. It was created on his behalf by Roney Mans, a trained medical scribe. The creation of this record is based on the scribe's personal observations and the provider's statements to them. This document has been checked and approved by the attending provider.

## 2021-08-28 ENCOUNTER — Ambulatory Visit
Admission: RE | Admit: 2021-08-28 | Discharge: 2021-08-28 | Disposition: A | Discharge status: Home / Self Care | Payer: Medicare PPO | Source: Ambulatory Visit | Attending: Radiation Oncology | Admitting: Radiation Oncology

## 2021-08-28 ENCOUNTER — Encounter: Payer: Self-pay | Admitting: Radiation Oncology

## 2021-08-28 ENCOUNTER — Other Ambulatory Visit: Payer: Self-pay

## 2021-08-28 VITALS — BP 131/62 | HR 62 | Temp 97.2°F | Resp 18 | Ht 60.0 in | Wt 103.1 lb

## 2021-08-28 DIAGNOSIS — Z923 Personal history of irradiation: Secondary | ICD-10-CM | POA: Diagnosis not present

## 2021-08-28 DIAGNOSIS — R918 Other nonspecific abnormal finding of lung field: Secondary | ICD-10-CM | POA: Diagnosis not present

## 2021-08-28 DIAGNOSIS — N1330 Unspecified hydronephrosis: Secondary | ICD-10-CM | POA: Diagnosis not present

## 2021-08-28 DIAGNOSIS — Z7982 Long term (current) use of aspirin: Secondary | ICD-10-CM | POA: Diagnosis not present

## 2021-08-28 DIAGNOSIS — I700 Atherosclerosis of aorta: Secondary | ICD-10-CM | POA: Insufficient documentation

## 2021-08-28 DIAGNOSIS — I270 Primary pulmonary hypertension: Secondary | ICD-10-CM | POA: Insufficient documentation

## 2021-08-28 DIAGNOSIS — J439 Emphysema, unspecified: Secondary | ICD-10-CM | POA: Diagnosis not present

## 2021-08-28 DIAGNOSIS — Z853 Personal history of malignant neoplasm of breast: Secondary | ICD-10-CM | POA: Insufficient documentation

## 2021-08-28 DIAGNOSIS — R911 Solitary pulmonary nodule: Secondary | ICD-10-CM | POA: Diagnosis not present

## 2021-08-28 DIAGNOSIS — Z79899 Other long term (current) drug therapy: Secondary | ICD-10-CM | POA: Insufficient documentation

## 2021-08-28 DIAGNOSIS — Z08 Encounter for follow-up examination after completed treatment for malignant neoplasm: Secondary | ICD-10-CM | POA: Diagnosis not present

## 2021-08-28 NOTE — Addendum Note (Signed)
Encounter addended by: Gery Pray, MD on: 08/28/2021 3:01 PM  Actions taken: Follow-up modified, Level of Service modified

## 2021-08-28 NOTE — Progress Notes (Signed)
Kimberly Jordan is here today for follow up post radiation to the lung.  Lung Side: Left, completed treatment on 04/25/21  Does the patient complain of any of the following: Pain: Patient denies pain. Shortness of breath w/wo exertion: Patient reports shortness of breath on exertion. Cough: no Hemoptysis: no Pain with swallowing: no Swallowing/choking concerns: no Appetite: Good Energy Level: Patient continues to have mild fatigue.  Post radiation skin Changes: skin intact.    Additional comments if applicable:   Vitals:   08/28/21 1139  BP: 131/62  Pulse: 62  Resp: 18  Temp: (!) 97.2 F (36.2 C)  TempSrc: Temporal  SpO2: 97%  Weight: 103 lb 2 oz (46.8 kg)  Height: 5' (1.524 m)

## 2021-09-05 DIAGNOSIS — Z85828 Personal history of other malignant neoplasm of skin: Secondary | ICD-10-CM | POA: Diagnosis not present

## 2021-09-05 DIAGNOSIS — C44319 Basal cell carcinoma of skin of other parts of face: Secondary | ICD-10-CM | POA: Diagnosis not present

## 2021-09-11 ENCOUNTER — Other Ambulatory Visit (HOSPITAL_COMMUNITY): Payer: Self-pay | Admitting: Internal Medicine

## 2021-09-11 ENCOUNTER — Other Ambulatory Visit: Payer: Self-pay | Admitting: Internal Medicine

## 2021-09-11 DIAGNOSIS — I10 Essential (primary) hypertension: Secondary | ICD-10-CM | POA: Diagnosis not present

## 2021-09-11 DIAGNOSIS — C50919 Malignant neoplasm of unspecified site of unspecified female breast: Secondary | ICD-10-CM | POA: Diagnosis not present

## 2021-09-11 DIAGNOSIS — M810 Age-related osteoporosis without current pathological fracture: Secondary | ICD-10-CM | POA: Diagnosis not present

## 2021-09-11 DIAGNOSIS — M545 Low back pain, unspecified: Secondary | ICD-10-CM | POA: Diagnosis not present

## 2021-09-11 DIAGNOSIS — N1330 Unspecified hydronephrosis: Secondary | ICD-10-CM

## 2021-09-11 DIAGNOSIS — S0003XA Contusion of scalp, initial encounter: Secondary | ICD-10-CM | POA: Diagnosis not present

## 2021-09-11 DIAGNOSIS — W010XXA Fall on same level from slipping, tripping and stumbling without subsequent striking against object, initial encounter: Secondary | ICD-10-CM | POA: Diagnosis not present

## 2021-09-11 DIAGNOSIS — R911 Solitary pulmonary nodule: Secondary | ICD-10-CM | POA: Diagnosis not present

## 2021-09-11 DIAGNOSIS — S40812A Abrasion of left upper arm, initial encounter: Secondary | ICD-10-CM | POA: Diagnosis not present

## 2021-09-12 ENCOUNTER — Ambulatory Visit (HOSPITAL_BASED_OUTPATIENT_CLINIC_OR_DEPARTMENT_OTHER)
Admission: RE | Admit: 2021-09-12 | Discharge: 2021-09-12 | Disposition: A | Discharge status: Home / Self Care | Payer: Medicare PPO | Source: Ambulatory Visit | Attending: Internal Medicine | Admitting: Internal Medicine

## 2021-09-12 ENCOUNTER — Other Ambulatory Visit: Payer: Self-pay

## 2021-09-12 DIAGNOSIS — N1330 Unspecified hydronephrosis: Secondary | ICD-10-CM | POA: Insufficient documentation

## 2021-09-26 DIAGNOSIS — S40812A Abrasion of left upper arm, initial encounter: Secondary | ICD-10-CM | POA: Diagnosis not present

## 2021-09-26 DIAGNOSIS — R911 Solitary pulmonary nodule: Secondary | ICD-10-CM | POA: Diagnosis not present

## 2021-09-26 DIAGNOSIS — N1330 Unspecified hydronephrosis: Secondary | ICD-10-CM | POA: Diagnosis not present

## 2021-09-26 DIAGNOSIS — W010XXA Fall on same level from slipping, tripping and stumbling without subsequent striking against object, initial encounter: Secondary | ICD-10-CM | POA: Diagnosis not present

## 2021-09-26 DIAGNOSIS — M545 Low back pain, unspecified: Secondary | ICD-10-CM | POA: Diagnosis not present

## 2021-09-26 DIAGNOSIS — S0003XA Contusion of scalp, initial encounter: Secondary | ICD-10-CM | POA: Diagnosis not present

## 2021-09-26 DIAGNOSIS — I10 Essential (primary) hypertension: Secondary | ICD-10-CM | POA: Diagnosis not present

## 2021-09-26 DIAGNOSIS — M810 Age-related osteoporosis without current pathological fracture: Secondary | ICD-10-CM | POA: Diagnosis not present

## 2021-09-29 DIAGNOSIS — E785 Hyperlipidemia, unspecified: Secondary | ICD-10-CM | POA: Diagnosis not present

## 2021-09-29 DIAGNOSIS — I10 Essential (primary) hypertension: Secondary | ICD-10-CM | POA: Diagnosis not present

## 2021-09-29 DIAGNOSIS — M810 Age-related osteoporosis without current pathological fracture: Secondary | ICD-10-CM | POA: Diagnosis not present

## 2021-09-29 DIAGNOSIS — M1990 Unspecified osteoarthritis, unspecified site: Secondary | ICD-10-CM | POA: Diagnosis not present

## 2021-10-13 DIAGNOSIS — H0100A Unspecified blepharitis right eye, upper and lower eyelids: Secondary | ICD-10-CM | POA: Diagnosis not present

## 2021-10-13 DIAGNOSIS — H524 Presbyopia: Secondary | ICD-10-CM | POA: Diagnosis not present

## 2021-10-13 DIAGNOSIS — H353132 Nonexudative age-related macular degeneration, bilateral, intermediate dry stage: Secondary | ICD-10-CM | POA: Diagnosis not present

## 2021-10-13 DIAGNOSIS — H35372 Puckering of macula, left eye: Secondary | ICD-10-CM | POA: Diagnosis not present

## 2021-10-30 DIAGNOSIS — M1990 Unspecified osteoarthritis, unspecified site: Secondary | ICD-10-CM | POA: Diagnosis not present

## 2021-10-30 DIAGNOSIS — I10 Essential (primary) hypertension: Secondary | ICD-10-CM | POA: Diagnosis not present

## 2021-10-30 DIAGNOSIS — M810 Age-related osteoporosis without current pathological fracture: Secondary | ICD-10-CM | POA: Diagnosis not present

## 2021-10-30 DIAGNOSIS — E785 Hyperlipidemia, unspecified: Secondary | ICD-10-CM | POA: Diagnosis not present

## 2021-11-03 DIAGNOSIS — Z23 Encounter for immunization: Secondary | ICD-10-CM | POA: Diagnosis not present

## 2021-11-08 ENCOUNTER — Ambulatory Visit: Payer: Medicare PPO | Admitting: Podiatry

## 2021-11-08 ENCOUNTER — Other Ambulatory Visit: Payer: Self-pay

## 2021-11-08 ENCOUNTER — Encounter: Payer: Self-pay | Admitting: Podiatry

## 2021-11-08 DIAGNOSIS — M79674 Pain in right toe(s): Secondary | ICD-10-CM

## 2021-11-08 DIAGNOSIS — M79675 Pain in left toe(s): Secondary | ICD-10-CM

## 2021-11-08 DIAGNOSIS — B351 Tinea unguium: Secondary | ICD-10-CM

## 2021-11-08 NOTE — Progress Notes (Signed)
This patient returns to the office for evaluation and treatment of long thick painful nails .  This patient is unable to trim his own nails since the patient cannot reach his feet.  Patient says the nails are painful walking and wearing his shoes.  He returns for preventive foot care services.  General Appearance  Alert, conversant and in no acute stress.  Vascular  Dorsalis pedis and posterior tibial  pulses are weakly  palpable  bilaterally.  Capillary return is within normal limits  bilaterally. Cold feet  Bilaterally. Absent digital hair.  Neurologic  Senn-Weinstein monofilament wire test within normal limits  bilaterally. Muscle power within normal limits bilaterally.  Nails Thick disfigured discolored nails with subungual debris  from hallux to fifth toes bilaterally. No evidence of bacterial infection or drainage bilaterally.  Orthopedic  No limitations of motion  feet .  No crepitus or effusions noted.  Hammer toes second  left.  HAV 1st MPJ  B/L.  Skin  normotropic skin with no porokeratosis noted bilaterally.  No signs of infections or ulcers noted.     Onychomycosis  Pain in toes right foot  Pain in toes left foot  Debridement  of nails  1-5  B/L with a nail nipper.  Nails were then filed using a dremel tool with no incidents.    RTC 3 months    Shulamit Donofrio DPM  

## 2021-11-27 ENCOUNTER — Other Ambulatory Visit: Payer: Self-pay | Admitting: Cardiology

## 2022-01-29 DIAGNOSIS — K8681 Exocrine pancreatic insufficiency: Secondary | ICD-10-CM | POA: Diagnosis not present

## 2022-01-29 DIAGNOSIS — D692 Other nonthrombocytopenic purpura: Secondary | ICD-10-CM | POA: Diagnosis not present

## 2022-01-29 DIAGNOSIS — M1990 Unspecified osteoarthritis, unspecified site: Secondary | ICD-10-CM | POA: Diagnosis not present

## 2022-01-29 DIAGNOSIS — I428 Other cardiomyopathies: Secondary | ICD-10-CM | POA: Diagnosis not present

## 2022-01-29 DIAGNOSIS — E785 Hyperlipidemia, unspecified: Secondary | ICD-10-CM | POA: Diagnosis not present

## 2022-01-29 DIAGNOSIS — M810 Age-related osteoporosis without current pathological fracture: Secondary | ICD-10-CM | POA: Diagnosis not present

## 2022-01-29 DIAGNOSIS — N1330 Unspecified hydronephrosis: Secondary | ICD-10-CM | POA: Diagnosis not present

## 2022-01-29 DIAGNOSIS — I10 Essential (primary) hypertension: Secondary | ICD-10-CM | POA: Diagnosis not present

## 2022-01-29 DIAGNOSIS — R911 Solitary pulmonary nodule: Secondary | ICD-10-CM | POA: Diagnosis not present

## 2022-02-13 ENCOUNTER — Other Ambulatory Visit: Payer: Self-pay

## 2022-02-13 ENCOUNTER — Ambulatory Visit: Payer: Medicare PPO | Admitting: Podiatry

## 2022-02-13 ENCOUNTER — Encounter: Payer: Self-pay | Admitting: Podiatry

## 2022-02-13 DIAGNOSIS — M79675 Pain in left toe(s): Secondary | ICD-10-CM

## 2022-02-13 DIAGNOSIS — M79674 Pain in right toe(s): Secondary | ICD-10-CM | POA: Diagnosis not present

## 2022-02-13 DIAGNOSIS — B351 Tinea unguium: Secondary | ICD-10-CM | POA: Diagnosis not present

## 2022-02-13 NOTE — Progress Notes (Signed)
This patient returns to the office for evaluation and treatment of long thick painful nails .  This patient is unable to trim his own nails since the patient cannot reach his feet.  Patient says the nails are painful walking and wearing his shoes.  He returns for preventive foot care services.  General Appearance  Alert, conversant and in no acute stress.  Vascular  Dorsalis pedis and posterior tibial  pulses are weakly  palpable  bilaterally.  Capillary return is within normal limits  bilaterally. Cold feet  Bilaterally. Absent digital hair.  Neurologic  Senn-Weinstein monofilament wire test within normal limits  bilaterally. Muscle power within normal limits bilaterally.  Nails Thick disfigured discolored nails with subungual debris  from hallux to fifth toes bilaterally. No evidence of bacterial infection or drainage bilaterally.  Orthopedic  No limitations of motion  feet .  No crepitus or effusions noted.  Hammer toes second  left.  HAV 1st MPJ  B/L.  Skin  normotropic skin with no porokeratosis noted bilaterally.  No signs of infections or ulcers noted.     Onychomycosis  Pain in toes right foot  Pain in toes left foot  Debridement  of nails  1-5  B/L with a nail nipper.  Nails were then filed using a dremel tool with no incidents.    RTC 3 months    Breyon Sigg DPM  

## 2022-02-27 ENCOUNTER — Ambulatory Visit (HOSPITAL_COMMUNITY)
Admission: RE | Admit: 2022-02-27 | Discharge: 2022-02-27 | Disposition: A | Discharge status: Home / Self Care | Payer: Medicare PPO | Source: Ambulatory Visit | Attending: Radiation Oncology | Admitting: Radiation Oncology

## 2022-02-27 ENCOUNTER — Encounter (HOSPITAL_COMMUNITY): Payer: Self-pay

## 2022-02-27 ENCOUNTER — Other Ambulatory Visit: Payer: Self-pay

## 2022-02-27 DIAGNOSIS — R911 Solitary pulmonary nodule: Secondary | ICD-10-CM | POA: Insufficient documentation

## 2022-02-27 DIAGNOSIS — R918 Other nonspecific abnormal finding of lung field: Secondary | ICD-10-CM | POA: Diagnosis not present

## 2022-02-27 DIAGNOSIS — Z85110 Personal history of malignant carcinoid tumor of bronchus and lung: Secondary | ICD-10-CM | POA: Diagnosis not present

## 2022-02-27 DIAGNOSIS — J439 Emphysema, unspecified: Secondary | ICD-10-CM | POA: Diagnosis not present

## 2022-02-28 DIAGNOSIS — Z85828 Personal history of other malignant neoplasm of skin: Secondary | ICD-10-CM | POA: Diagnosis not present

## 2022-02-28 DIAGNOSIS — D485 Neoplasm of uncertain behavior of skin: Secondary | ICD-10-CM | POA: Diagnosis not present

## 2022-02-28 DIAGNOSIS — C44311 Basal cell carcinoma of skin of nose: Secondary | ICD-10-CM | POA: Diagnosis not present

## 2022-02-28 DIAGNOSIS — L821 Other seborrheic keratosis: Secondary | ICD-10-CM | POA: Diagnosis not present

## 2022-02-28 DIAGNOSIS — L570 Actinic keratosis: Secondary | ICD-10-CM | POA: Diagnosis not present

## 2022-02-28 NOTE — Progress Notes (Signed)
?Radiation Oncology         [phone] ?________________________________ ? ?Name: Kimberly Jordan MRN: [id number]  ?Date: 03/01/2022  DOB: 01-18-1929 ? ?Follow-Up Visit Note ? ?CC: Avva, Ravisankar, MD  Lajuana Matte, MD ? ?  ICD-10-CM   ?1. Solitary pulmonary nodule  R91.1 CT CHEST WO CONTRAST  ?  ? ? ?Diagnosis: The primary encounter diagnosis was Lung nodule. Diagnoses of History of breast cancer and Solitary pulmonary nodule were also pertinent to this visit. ?  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 ?Interval Since Last Radiation: 10 months and 6 days  ? ?Radiation treatment dates: 04/18/21-04/25/21 ? ? ?Narrative:  The patient returns today for routine follow-up and to review recent imaging. She was last seen here for follow up on 08/28/21.      ? ?Recent chest CT on 02/27/22 demonstrated interval enlargement of a now single consolidative density in the superior aspect of the left lower lobe, in the region of previous pulmonary nodules. Differentials noted for this finding included scarring/atelectasis secondary to previous treatment/intervention, or progressive consolidation and enlargement of the previous nodules. CT also showed a stable 8 mm irregular subsolid pulmonary nodule in the right lower lobe.    ? ?Of note: Renal ultrasound performed on 09/12/21, prompted due to hx of hydronephrosis, showed no acute findings.      ? ?Otherwise, no significant interval history since the patient was last seen.  She denies any pain within the chest area significant cough or hemoptysis.                  ? ?Allergies:  is allergic to fosamax [alendronate], hydrocodone, indomethacin, penicillin g sodium, and tramadol. ? ?Meds: ?Current Outpatient Medications  ?Medication Sig Dispense Refill  ? amLODipine (NORVASC) 10 MG tablet Take 1 tablet by mouth daily.    ? aspirin 81 MG tablet Take 81 mg by mouth daily.    ? Calcium Carbonate-Vitamin D (CALCIUM + D PO) Take 600 mg by mouth 3  (three) times daily.    ? carvedilol (COREG) 12.5 MG tablet TAKE ONE TABLET BY MOUTH TWICE DAILY WITH FOOD. 180 tablet 3  ? clobetasol cream (TEMOVATE) 1.32 % Apply 1 application topically as needed (For itching on back).     ? CREON 24000 units CPEP Take 3 capsules by mouth 3 (three) times daily. Takes 2- 24000 and 1- 36000 TID    ? doxycycline (VIBRA-TABS) 100 MG tablet Take 100 mg by mouth every evening.     ? MYRBETRIQ 50 MG TB24 tablet     ? simvastatin (ZOCOR) 20 MG tablet Take 20 mg by mouth every evening.     ? telmisartan (MICARDIS) 80 MG tablet Take 1 tablet (80 mg total) by mouth daily. 90 tablet 1  ? erythromycin ophthalmic ointment  (Patient not taking: Reported on 03/01/2022)    ? OVER THE COUNTER MEDICATION Place 1 application into both eyes as needed. occu scrub (Patient not taking: Reported on 08/28/2021)    ? zoledronic acid (RECLAST) 5 MG/100ML SOLN injection Inject 5 mg into the vein once. Yearly medication,  given in Jan. (Patient not taking: Reported on 03/01/2022)    ? ?No current facility-administered medications for this encounter.  ? ? ?Physical Findings: ?The patient is in no acute distress. Patient is alert and oriented. ? height is 5' (1.524 m) and weight is 103 lb (46.7 kg). Her temporal temperature is 96.7 ?F (35.9 ?C) (abnormal). Her blood pressure is 139/62 and  her pulse is 69. Her respiration is 20 and oxygen saturation is 98%. .  Lungs are clear to auscultation bilaterally. Heart has regular rate and rhythm. No palpable cervical, supraclavicular, or axillary adenopathy. Abdomen soft, non-tender, normal bowel sounds. ? ? ? ?Lab Findings: ?Lab Results  ?Component Value Date  ? WBC 7.4 10/15/2014  ? HGB 11.8 (L) 10/15/2014  ? HCT 34.0 (L) 10/15/2014  ? MCV 96.3 10/15/2014  ? PLT 144 (L) 10/15/2014  ? ? ?Radiographic Findings: ?CT CHEST WO CONTRAST ? ?Result Date: 02/28/2022 ?CLINICAL DATA:  History of lung cancer, follow-up EXAM: CT CHEST WITHOUT CONTRAST TECHNIQUE: Multidetector CT imaging  of the chest was performed following the standard protocol without IV contrast. RADIATION DOSE REDUCTION: This exam was performed according to the departmental dose-optimization program which includes automated exposure control, adjustment of the mA and/or kV according to patient size and/or use of iterative reconstruction technique. COMPARISON:  CT chest 08/22/2021 FINDINGS: Cardiovascular: Heart size is normal. No pericardial effusion identified. Coronary artery calcifications and stable large dense calcification in the left heart. Main pulmonary artery is normal caliber. Thoracic aorta is normal caliber with moderate calcified plaques. Mediastinum/Nodes: No bulky axillary, mediastinal or definite hilar lymphadenopathy identified. Surgical clips in the right axilla and right breast. Lungs/Pleura: Lungs are hyperinflated with moderate emphysematous changes, upper lobe predominant. Biapical pleural thickening. In the region of previous pulmonary nodules at the superior aspect of the left lower lobe near the fissure, there is now a more consolidative appearing density measuring approximately 1.8 x 3.2 x 1.5 cm with partially irregular margins and partially abutting the major fissure. Stable 8 mm irregular ground-glass density in the right lower lobe image 67. No new pulmonary nodule identified. No pleural effusion or pneumothorax. Upper Abdomen: No acute abnormality. Musculoskeletal: Scoliosis and exaggerated kyphosis of the thoracic spine. Degenerative changes in the thoracic spine. No suspicious bony lesions visualized. IMPRESSION: 1. Interval enlargement of a now single consolidative density in the superior aspect of the left lower lobe, region of previous pulmonary nodules. Could be scarring/atelectasis secondary to previous treatment/intervention, or progressive consolidation and enlargement of the previous nodules. Consider follow-up CT in 3 months and or PET-CT. 2. Stable 8 mm irregular subsolid pulmonary  nodule in the right lower lobe. 3. Emphysema. 4. Coronary artery disease and atherosclerotic disease. Aortic Atherosclerosis (ICD10-I70.0) and Emphysema (ICD10-J43.9). Electronically Signed   By: Ofilia Neas M.D.   On: 02/28/2022 09:59   ? ?Impression: The primary encounter diagnosis was Lung nodule. Diagnoses of History of breast cancer and Solitary pulmonary nodule were also pertinent to this visit. ?  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  We reviewed the patient's recent chest CT scan.  I discussed potential options including short interval chest CT scan or proceed with a PET scan at this time.  She is most in favor of repeating a chest CT scan in 3 months and I have ordered this for her today. ? ? ? ?Plan: Short interval chest CT scan in 3 months and follow-up soon after for review. ? ? ?20 minutes of total time was spent for this patient encounter, including preparation, face-to-face counseling with the patient and coordination of care, physical exam, and documentation of the encounter. ?____________________________________ ? ?Blair Promise, PhD, MD ? ? ?This document serves as a record of services personally performed by Gery Pray, MD. It was created on his behalf by Roney Mans, a trained medical scribe. The creation of this record is based on the scribe's  personal observations and the provider's statements to them. This document has been checked and approved by the attending provider. ? ?

## 2022-03-01 ENCOUNTER — Ambulatory Visit
Admission: RE | Admit: 2022-03-01 | Discharge: 2022-03-01 | Disposition: A | Discharge status: Home / Self Care | Payer: Medicare PPO | Source: Ambulatory Visit | Attending: Radiation Oncology | Admitting: Radiation Oncology

## 2022-03-01 ENCOUNTER — Encounter: Payer: Self-pay | Admitting: Radiation Oncology

## 2022-03-01 ENCOUNTER — Other Ambulatory Visit: Payer: Self-pay

## 2022-03-01 DIAGNOSIS — Z79899 Other long term (current) drug therapy: Secondary | ICD-10-CM | POA: Diagnosis not present

## 2022-03-01 DIAGNOSIS — I2510 Atherosclerotic heart disease of native coronary artery without angina pectoris: Secondary | ICD-10-CM | POA: Insufficient documentation

## 2022-03-01 DIAGNOSIS — R911 Solitary pulmonary nodule: Secondary | ICD-10-CM | POA: Insufficient documentation

## 2022-03-01 DIAGNOSIS — Z08 Encounter for follow-up examination after completed treatment for malignant neoplasm: Secondary | ICD-10-CM | POA: Diagnosis not present

## 2022-03-01 DIAGNOSIS — I700 Atherosclerosis of aorta: Secondary | ICD-10-CM | POA: Insufficient documentation

## 2022-03-01 DIAGNOSIS — Z853 Personal history of malignant neoplasm of breast: Secondary | ICD-10-CM | POA: Insufficient documentation

## 2022-03-01 DIAGNOSIS — Z923 Personal history of irradiation: Secondary | ICD-10-CM | POA: Insufficient documentation

## 2022-03-01 NOTE — Progress Notes (Signed)
Kimberly Jordan is here today for follow up post radiation to the lung. ? ?Lung Side: Right, patient completed treatment on 04/25/21. ? ?Does the patient complain of any of the following: ?Pain: no ?Shortness of breath w/wo exertion: yes, on exertion ?Cough: no ?Hemoptysis: no ?Pain with swallowing: no ?Swallowing/choking concerns: no ?Appetite: good ?Energy Level: good  ?Post radiation skin Changes: no ? ? ? ?Additional comments if applicable:  ? ?Vitals:  ? 03/01/22 1122  ?BP: 139/62  ?Pulse: 69  ?Resp: 20  ?Temp: (!) 96.7 ?F (35.9 ?C)  ?TempSrc: Temporal  ?SpO2: 98%  ?Weight: 103 lb (46.7 kg)  ?Height: 5' (1.524 m)  ?  ?

## 2022-03-30 DIAGNOSIS — I10 Essential (primary) hypertension: Secondary | ICD-10-CM | POA: Diagnosis not present

## 2022-03-30 DIAGNOSIS — M810 Age-related osteoporosis without current pathological fracture: Secondary | ICD-10-CM | POA: Diagnosis not present

## 2022-03-30 DIAGNOSIS — E785 Hyperlipidemia, unspecified: Secondary | ICD-10-CM | POA: Diagnosis not present

## 2022-03-30 DIAGNOSIS — M1990 Unspecified osteoarthritis, unspecified site: Secondary | ICD-10-CM | POA: Diagnosis not present

## 2022-04-29 DIAGNOSIS — I10 Essential (primary) hypertension: Secondary | ICD-10-CM | POA: Diagnosis not present

## 2022-04-29 DIAGNOSIS — E785 Hyperlipidemia, unspecified: Secondary | ICD-10-CM | POA: Diagnosis not present

## 2022-04-29 DIAGNOSIS — M810 Age-related osteoporosis without current pathological fracture: Secondary | ICD-10-CM | POA: Diagnosis not present

## 2022-04-29 DIAGNOSIS — M1990 Unspecified osteoarthritis, unspecified site: Secondary | ICD-10-CM | POA: Diagnosis not present

## 2022-05-15 ENCOUNTER — Encounter: Payer: Self-pay | Admitting: Podiatry

## 2022-05-15 ENCOUNTER — Ambulatory Visit: Payer: Medicare PPO | Admitting: Podiatry

## 2022-05-15 ENCOUNTER — Telehealth: Payer: Self-pay | Admitting: *Deleted

## 2022-05-15 DIAGNOSIS — B351 Tinea unguium: Secondary | ICD-10-CM | POA: Diagnosis not present

## 2022-05-15 DIAGNOSIS — L600 Ingrowing nail: Secondary | ICD-10-CM

## 2022-05-15 DIAGNOSIS — M79675 Pain in left toe(s): Secondary | ICD-10-CM

## 2022-05-15 DIAGNOSIS — M79674 Pain in right toe(s): Secondary | ICD-10-CM

## 2022-05-15 NOTE — Progress Notes (Signed)
This patient returns to the office for evaluation and treatment of long thick painful nails .  This patient is unable to trim his own nails since the patient cannot reach his feet.  Patient says the nails are painful walking and wearing his shoes.  He returns for preventive foot care services.  General Appearance  Alert, conversant and in no acute stress.  Vascular  Dorsalis pedis and posterior tibial  pulses are weakly  palpable  bilaterally.  Capillary return is within normal limits  bilaterally. Cold feet  Bilaterally. Absent digital hair.  Neurologic  Senn-Weinstein monofilament wire test within normal limits  bilaterally. Muscle power within normal limits bilaterally.  Nails Thick disfigured discolored nails with subungual debris  from hallux to fifth toes bilaterally. No evidence of bacterial infection or drainage bilaterally.  Orthopedic  No limitations of motion  feet .  No crepitus or effusions noted.  Hammer toes second  left.  HAV 1st MPJ  B/L.  Skin  normotropic skin with no porokeratosis noted bilaterally.  No signs of infections or ulcers noted.     Onychomycosis  Pain in toes right foot  Pain in toes left foot  Debridement  of nails  1-5  B/L with a nail nipper.  Nails were then filed using a dremel tool with no incidents.    RTC 3 months    Aleane Wesenberg DPM  

## 2022-05-15 NOTE — Telephone Encounter (Signed)
CALLED PATIENT TO INFORM OF CT FOR 06-01-22- ARRIVAL TIME- 11 AM @ [facility], NO RESTRICTIONS TO TEST, PATIENT TO RECEIVE RESULTS FROM DR. KINARD ON 06-04-22 @ 11 AM, SPOKE WITH PATIENT AND SHE IS AWARE OF THESE APPTS. ?

## 2022-06-01 ENCOUNTER — Ambulatory Visit (HOSPITAL_COMMUNITY)
Admission: RE | Admit: 2022-06-01 | Discharge: 2022-06-01 | Disposition: A | Discharge status: Home / Self Care | Payer: Medicare PPO | Source: Ambulatory Visit | Attending: Radiation Oncology | Admitting: Radiation Oncology

## 2022-06-01 DIAGNOSIS — C3490 Malignant neoplasm of unspecified part of unspecified bronchus or lung: Secondary | ICD-10-CM | POA: Diagnosis not present

## 2022-06-01 DIAGNOSIS — R911 Solitary pulmonary nodule: Secondary | ICD-10-CM | POA: Insufficient documentation

## 2022-06-01 DIAGNOSIS — J181 Lobar pneumonia, unspecified organism: Secondary | ICD-10-CM | POA: Diagnosis not present

## 2022-06-01 DIAGNOSIS — J439 Emphysema, unspecified: Secondary | ICD-10-CM | POA: Diagnosis not present

## 2022-06-01 DIAGNOSIS — I700 Atherosclerosis of aorta: Secondary | ICD-10-CM | POA: Diagnosis not present

## 2022-06-03 NOTE — Progress Notes (Signed)
Radiation Oncology         [phone] ________________________________  Name: Kimberly Jordan MRN: [id number]  Date: 06/04/2022  DOB: 1929-01-01  Follow-Up Visit Note  CC: Prince Solian, MD  Lajuana Matte, MD  No diagnosis found.  Diagnosis: The primary encounter diagnosis was Lung nodule. Diagnoses of History of breast cancer and Solitary pulmonary nodule were also pertinent to this visit.   Left lower lobe lung nodule, suspected low-grade adenocarcinoma  Interval Since Last Radiation: 1 year, 1 month, and 10 days   Radiation treatment dates: 04/18/21-04/25/21   Narrative:  The patient returns today for routine follow-up and to review recent imaging, she was last seen here for follow up on 03/01/22. Her most recent chest CT on 06/01/22 showed the dense consolidation of the superior segment left lower lobe abutting the major fissure as unchanged in the interval. A nonspecific 0.8 cm ground-glass nodule of the superior segment right lower lobe was also appreciated as stable.   Otherwise, no significant interval history since the patient was last seen.   ***                              Allergies:  is allergic to fosamax [alendronate], hydrocodone, indomethacin, penicillin g sodium, and tramadol.  Meds: Current Outpatient Medications  Medication Sig Dispense Refill   amLODipine (NORVASC) 10 MG tablet Take 1 tablet by mouth daily.     aspirin 81 MG tablet Take 81 mg by mouth daily.     Calcium Carbonate-Vitamin D (CALCIUM + D PO) Take 600 mg by mouth 3 (three) times daily.     carvedilol (COREG) 12.5 MG tablet TAKE ONE TABLET BY MOUTH TWICE DAILY WITH FOOD. 180 tablet 3   clobetasol cream (TEMOVATE) 5.88 % Apply 1 application topically as needed (For itching on back).      CREON 24000 units CPEP Take 3 capsules by mouth 3 (three) times daily. Takes 2- 24000 and 1- 36000 TID     doxycycline (VIBRA-TABS) 100 MG tablet Take 100 mg by mouth every evening.      erythromycin  ophthalmic ointment  (Patient not taking: Reported on 03/01/2022)     MYRBETRIQ 50 MG TB24 tablet      OVER THE COUNTER MEDICATION Place 1 application into both eyes as needed. occu scrub (Patient not taking: Reported on 08/28/2021)     simvastatin (ZOCOR) 20 MG tablet Take 20 mg by mouth every evening.      telmisartan (MICARDIS) 80 MG tablet Take 1 tablet (80 mg total) by mouth daily. 90 tablet 1   zoledronic acid (RECLAST) 5 MG/100ML SOLN injection Inject 5 mg into the vein once. Yearly medication,  given in Jan. (Patient not taking: Reported on 03/01/2022)     No current facility-administered medications for this encounter.    Physical Findings: The patient is in no acute distress. Patient is alert and oriented.  vitals were not taken for this visit. .  No significant changes. Lungs are clear to auscultation bilaterally. Heart has regular rate and rhythm. No palpable cervical, supraclavicular, or axillary adenopathy. Abdomen soft, non-tender, normal bowel sounds. ***   Lab Findings: Lab Results  Component Value Date   WBC 7.4 10/15/2014   HGB 11.8 (L) 10/15/2014   HCT 34.0 (L) 10/15/2014   MCV 96.3 10/15/2014   PLT 144 (L) 10/15/2014    Radiographic Findings: CT CHEST WO CONTRAST  Result Date: 06/02/2022 CLINICAL DATA:  Non-small cell lung cancer, assess treatment response * Tracking Code: BO * EXAM: CT CHEST WITHOUT CONTRAST TECHNIQUE: Multidetector CT imaging of the chest was performed following the standard protocol without IV contrast. RADIATION DOSE REDUCTION: This exam was performed according to the departmental dose-optimization program which includes automated exposure control, adjustment of the mA and/or kV according to patient size and/or use of iterative reconstruction technique. COMPARISON:  02/27/2022 FINDINGS: Cardiovascular: Aortic atherosclerosis. Normal heart size. Unchanged dense calcification in the vicinity of the left interatrial septum left aspect of the aortic root,  possibly an unusually eccentric mitral annulus calcification. Left coronary artery calcifications. No pericardial effusion. Mediastinum/Nodes: No enlarged mediastinal, hilar, or axillary lymph nodes. Thyroid gland, trachea, and esophagus demonstrate no significant findings. Lungs/Pleura: Severe centrilobular emphysema. Unchanged dense consolidation of the superior segment left lower lobe abutting the major fissure measuring 3.1 x 1.8 cm (series 5, image 42). Unchanged 0.8 cm ground-glass nodule of the superior segment right lower lobe (series 5, image 63). Subpleural radiation fibrosis of the anterior right middle lobe and right upper lobe (series 5, image 75). No pleural effusion or pneumothorax. Upper Abdomen: No acute abnormality. Musculoskeletal: Postoperative dystrophic calcifications within the medial right breast (series 2, image 72). No suspicious osseous lesions identified. IMPRESSION: 1. Unchanged dense consolidation of the superior segment left lower lobe abutting the major fissure, in keeping with treated primary lung malignancy. 2. Unchanged 0.8 cm ground-glass nodule of the superior segment right lower lobe, nonspecific. Indolent adenocarcinoma is not excluded. Attention on follow-up. 3. Severe emphysema. 4. Coronary artery disease. Aortic Atherosclerosis (ICD10-I70.0) and Emphysema (ICD10-J43.9). Electronically Signed   By: Delanna Ahmadi M.D.   On: 06/02/2022 21:08    Impression:  The primary encounter diagnosis was Lung nodule. Diagnoses of History of breast cancer and Solitary pulmonary nodule were also pertinent to this visit.   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  ***  Plan:  ***   *** minutes of total time was spent for this patient encounter, including preparation, face-to-face counseling with the patient and coordination of care, physical exam, and documentation of the  encounter. ____________________________________  Blair Promise, PhD, MD  This document serves as a record of services personally performed by Gery Pray, MD. It was created on his behalf by Roney Mans, a trained medical scribe. The creation of this record is based on the scribe's personal observations and the provider's statements to them. This document has been checked and approved by the attending provider.

## 2022-06-04 ENCOUNTER — Encounter: Payer: Self-pay | Admitting: Radiation Oncology

## 2022-06-04 ENCOUNTER — Other Ambulatory Visit: Payer: Self-pay

## 2022-06-04 ENCOUNTER — Ambulatory Visit
Admission: RE | Admit: 2022-06-04 | Discharge: 2022-06-04 | Disposition: A | Discharge status: Home / Self Care | Payer: Medicare PPO | Source: Ambulatory Visit | Attending: Radiation Oncology | Admitting: Radiation Oncology

## 2022-06-04 VITALS — BP 147/64 | HR 66 | Temp 97.1°F | Resp 18 | Ht 60.0 in | Wt 100.2 lb

## 2022-06-04 DIAGNOSIS — R911 Solitary pulmonary nodule: Secondary | ICD-10-CM | POA: Insufficient documentation

## 2022-06-04 DIAGNOSIS — Z853 Personal history of malignant neoplasm of breast: Secondary | ICD-10-CM | POA: Insufficient documentation

## 2022-06-04 NOTE — Progress Notes (Signed)
Kimberly Jordan is here today for follow up post radiation to the lung.  Lung Side: Left, patient completed treatment on 04/25/21.  Does the patient complain of any of the following: Pain: No Shortness of breath w/wo exertion: Yes, on exertion.  Cough: No- reports having a tickle in her throat.  Hemoptysis: no Pain with swallowing: No Swallowing/choking concerns: No Appetite: Good Energy Level: Good Post radiation skin Changes: No    Additional comments if applicable:   BP (!) 416/60 (BP Location: Left Arm, Patient Position: Sitting)   Pulse 66   Temp (!) 97.1 F (36.2 C) (Temporal)   Resp 18   Ht 5' (1.524 m)   Wt 100 lb 3 oz (45.4 kg)   LMP 12/31/1992   SpO2 97%   BMI 19.57 kg/m

## 2022-06-05 ENCOUNTER — Other Ambulatory Visit: Payer: Self-pay

## 2022-06-05 ENCOUNTER — Telehealth: Payer: Self-pay | Admitting: *Deleted

## 2022-06-05 DIAGNOSIS — Z853 Personal history of malignant neoplasm of breast: Secondary | ICD-10-CM

## 2022-06-05 NOTE — Telephone Encounter (Signed)
Called patient to inform of mammogram and ultrsound on 06-07-22 - arrival time- 10:45 am @ [facility], address - [address], [address], ph. No. [phone], informed patient to not wear perfume, lotions, deodrant or powder on her breast, spoke with patient and she is aware of this appt. and the instructions

## 2022-06-07 ENCOUNTER — Telehealth: Payer: Self-pay | Admitting: Oncology

## 2022-06-07 DIAGNOSIS — Z853 Personal history of malignant neoplasm of breast: Secondary | ICD-10-CM

## 2022-06-07 DIAGNOSIS — R922 Inconclusive mammogram: Secondary | ICD-10-CM | POA: Diagnosis not present

## 2022-06-07 NOTE — Telephone Encounter (Signed)
Received call from [place] at [facility].  They need the order to be changed for today's appointment to a diagnostic bilateral mammogram with ultrasound.  She would like to have the order faxed to [facility] at [phone].

## 2022-08-09 DIAGNOSIS — R7301 Impaired fasting glucose: Secondary | ICD-10-CM | POA: Diagnosis not present

## 2022-08-09 DIAGNOSIS — I10 Essential (primary) hypertension: Secondary | ICD-10-CM | POA: Diagnosis not present

## 2022-08-09 DIAGNOSIS — R7989 Other specified abnormal findings of blood chemistry: Secondary | ICD-10-CM | POA: Diagnosis not present

## 2022-08-09 DIAGNOSIS — E785 Hyperlipidemia, unspecified: Secondary | ICD-10-CM | POA: Diagnosis not present

## 2022-08-09 DIAGNOSIS — M810 Age-related osteoporosis without current pathological fracture: Secondary | ICD-10-CM | POA: Diagnosis not present

## 2022-08-15 ENCOUNTER — Ambulatory Visit: Payer: Medicare PPO | Admitting: Podiatry

## 2022-08-15 ENCOUNTER — Encounter: Payer: Self-pay | Admitting: Podiatry

## 2022-08-15 DIAGNOSIS — M79675 Pain in left toe(s): Secondary | ICD-10-CM | POA: Diagnosis not present

## 2022-08-15 DIAGNOSIS — M79674 Pain in right toe(s): Secondary | ICD-10-CM | POA: Diagnosis not present

## 2022-08-15 DIAGNOSIS — L600 Ingrowing nail: Secondary | ICD-10-CM

## 2022-08-15 DIAGNOSIS — B351 Tinea unguium: Secondary | ICD-10-CM | POA: Diagnosis not present

## 2022-08-15 NOTE — Progress Notes (Signed)
This patient returns to the office for evaluation and treatment of long thick painful nails .  This patient is unable to trim his own nails since the patient cannot reach his feet.  Patient says the nails are painful walking and wearing his shoes.  He returns for preventive foot care services.  General Appearance  Alert, conversant and in no acute stress.  Vascular  Dorsalis pedis and posterior tibial  pulses are weakly  palpable  bilaterally.  Capillary return is within normal limits  bilaterally. Cold feet  Bilaterally. Absent digital hair.  Neurologic  Senn-Weinstein monofilament wire test within normal limits  bilaterally. Muscle power within normal limits bilaterally.  Nails Thick disfigured discolored nails with subungual debris  from hallux to fifth toes bilaterally. No evidence of bacterial infection or drainage bilaterally.  Orthopedic  No limitations of motion  feet .  No crepitus or effusions noted.  Hammer toes second  left.  HAV 1st MPJ  B/L.  Skin  normotropic skin with no porokeratosis noted bilaterally.  No signs of infections or ulcers noted.     Onychomycosis  Pain in toes right foot  Pain in toes left foot  Debridement  of nails  1-5  B/L with a nail nipper.  Nails were then filed using a dremel tool with no incidents.    RTC 3 months    Geneveive Furness DPM  

## 2022-08-16 DIAGNOSIS — R82998 Other abnormal findings in urine: Secondary | ICD-10-CM | POA: Diagnosis not present

## 2022-08-16 DIAGNOSIS — Z0000 Encounter for general adult medical examination without abnormal findings: Secondary | ICD-10-CM | POA: Diagnosis not present

## 2022-08-16 DIAGNOSIS — M7121 Synovial cyst of popliteal space [Baker], right knee: Secondary | ICD-10-CM | POA: Diagnosis not present

## 2022-08-16 DIAGNOSIS — M1990 Unspecified osteoarthritis, unspecified site: Secondary | ICD-10-CM | POA: Diagnosis not present

## 2022-08-16 DIAGNOSIS — I10 Essential (primary) hypertension: Secondary | ICD-10-CM | POA: Diagnosis not present

## 2022-08-16 DIAGNOSIS — R911 Solitary pulmonary nodule: Secondary | ICD-10-CM | POA: Diagnosis not present

## 2022-08-16 DIAGNOSIS — M810 Age-related osteoporosis without current pathological fracture: Secondary | ICD-10-CM | POA: Diagnosis not present

## 2022-08-16 DIAGNOSIS — D692 Other nonthrombocytopenic purpura: Secondary | ICD-10-CM | POA: Diagnosis not present

## 2022-08-16 DIAGNOSIS — I428 Other cardiomyopathies: Secondary | ICD-10-CM | POA: Diagnosis not present

## 2022-08-16 DIAGNOSIS — E785 Hyperlipidemia, unspecified: Secondary | ICD-10-CM | POA: Diagnosis not present

## 2022-09-11 DIAGNOSIS — L814 Other melanin hyperpigmentation: Secondary | ICD-10-CM | POA: Diagnosis not present

## 2022-09-11 DIAGNOSIS — D1801 Hemangioma of skin and subcutaneous tissue: Secondary | ICD-10-CM | POA: Diagnosis not present

## 2022-09-11 DIAGNOSIS — L821 Other seborrheic keratosis: Secondary | ICD-10-CM | POA: Diagnosis not present

## 2022-09-11 DIAGNOSIS — Z85828 Personal history of other malignant neoplasm of skin: Secondary | ICD-10-CM | POA: Diagnosis not present

## 2022-09-11 DIAGNOSIS — D2271 Melanocytic nevi of right lower limb, including hip: Secondary | ICD-10-CM | POA: Diagnosis not present

## 2022-09-11 DIAGNOSIS — D2272 Melanocytic nevi of left lower limb, including hip: Secondary | ICD-10-CM | POA: Diagnosis not present

## 2022-09-11 DIAGNOSIS — D2262 Melanocytic nevi of left upper limb, including shoulder: Secondary | ICD-10-CM | POA: Diagnosis not present

## 2022-09-11 DIAGNOSIS — L570 Actinic keratosis: Secondary | ICD-10-CM | POA: Diagnosis not present

## 2022-09-11 DIAGNOSIS — D225 Melanocytic nevi of trunk: Secondary | ICD-10-CM | POA: Diagnosis not present

## 2022-10-17 DIAGNOSIS — H04123 Dry eye syndrome of bilateral lacrimal glands: Secondary | ICD-10-CM | POA: Diagnosis not present

## 2022-10-17 DIAGNOSIS — H353132 Nonexudative age-related macular degeneration, bilateral, intermediate dry stage: Secondary | ICD-10-CM | POA: Diagnosis not present

## 2022-10-17 DIAGNOSIS — H11823 Conjunctivochalasis, bilateral: Secondary | ICD-10-CM | POA: Diagnosis not present

## 2022-10-17 DIAGNOSIS — H0100A Unspecified blepharitis right eye, upper and lower eyelids: Secondary | ICD-10-CM | POA: Diagnosis not present

## 2022-10-17 DIAGNOSIS — H524 Presbyopia: Secondary | ICD-10-CM | POA: Diagnosis not present

## 2022-10-17 DIAGNOSIS — H0100B Unspecified blepharitis left eye, upper and lower eyelids: Secondary | ICD-10-CM | POA: Diagnosis not present

## 2022-10-17 DIAGNOSIS — H02403 Unspecified ptosis of bilateral eyelids: Secondary | ICD-10-CM | POA: Diagnosis not present

## 2022-10-17 DIAGNOSIS — H5213 Myopia, bilateral: Secondary | ICD-10-CM | POA: Diagnosis not present

## 2022-10-24 ENCOUNTER — Encounter: Payer: Self-pay | Admitting: Podiatry

## 2022-10-24 ENCOUNTER — Ambulatory Visit: Payer: Medicare PPO | Admitting: Podiatry

## 2022-10-24 DIAGNOSIS — M79674 Pain in right toe(s): Secondary | ICD-10-CM

## 2022-10-24 DIAGNOSIS — B351 Tinea unguium: Secondary | ICD-10-CM | POA: Diagnosis not present

## 2022-10-24 DIAGNOSIS — M79675 Pain in left toe(s): Secondary | ICD-10-CM | POA: Diagnosis not present

## 2022-10-24 NOTE — Progress Notes (Signed)
This patient returns to the office for evaluation and treatment of long thick painful nails .  This patient is unable to trim his own nails since the patient cannot reach his feet.  Patient says the nails are painful walking and wearing his shoes.  He returns for preventive foot care services.  General Appearance  Alert, conversant and in no acute stress.  Vascular  Dorsalis pedis and posterior tibial  pulses are weakly  palpable  bilaterally.  Capillary return is within normal limits  bilaterally. Cold feet  Bilaterally. Absent digital hair.  Neurologic  Senn-Weinstein monofilament wire test within normal limits  bilaterally. Muscle power within normal limits bilaterally.  Nails Thick disfigured discolored nails with subungual debris  from hallux to fifth toes bilaterally. No evidence of bacterial infection or drainage bilaterally.  Orthopedic  No limitations of motion  feet .  No crepitus or effusions noted.  Hammer toes second  left.  HAV 1st MPJ  B/L.  Skin  normotropic skin with no porokeratosis noted bilaterally.  No signs of infections or ulcers noted.     Onychomycosis  Pain in toes right foot  Pain in toes left foot  Debridement  of nails  1-5  B/L with a nail nipper.  Nails were then filed using a dremel tool with no incidents.    RTC 3 months    Aundra Espin DPM  

## 2022-11-03 ENCOUNTER — Other Ambulatory Visit: Payer: Self-pay | Admitting: Cardiology

## 2022-11-06 DIAGNOSIS — Z23 Encounter for immunization: Secondary | ICD-10-CM | POA: Diagnosis not present

## 2022-11-12 ENCOUNTER — Ambulatory Visit: Payer: Self-pay

## 2022-11-12 NOTE — Patient Instructions (Signed)
Visit Information  Thank you for taking time to visit with me today. Please don't hesitate to contact me if I can be of assistance to you.   Following are the goals we discussed today:   Goals Addressed             This Visit's Progress    COMPLETED: Care Coordination Activities       Care Coordination Interventions: SDoH screening performed - no acute resource challenges identified at this time Determined the patient does not have concerns with medication costs and/or adherence Education provided on the role of the care coordination team - no follow up desired at this time Instructed the patient to contact her primary care provider as needed         If you are experiencing a Mental Health or [place] or need someone to talk to, please call 1-800-273-TALK (toll free, 24 hour hotline)  The patient verbalized understanding of instructions, educational materials, and care plan provided today and DECLINED offer to receive copy of patient instructions, educational materials, and care plan.   No further follow up required: Please contact your primary care provider as needed.  Daneen Schick, BSW, CDP Social Worker, Certified Dementia Practitioner [facility]  Care Coordination [phone]

## 2022-11-12 NOTE — Patient Outreach (Signed)
  Care Coordination   Initial Visit Note   11/12/2022 Name: AMBERMARIE HONEYMAN MRN: [id number] DOB: [birth year]  ANNALISE MCDIARMID is a 88 y.o. year old female who sees Avva, Ravisankar, MD for primary care. I spoke with  [name] by phone today.  What matters to the patients health and wellness today?  No concerns, doing well at this time    Goals Addressed             This Visit's Progress    COMPLETED: Care Coordination Activities       Care Coordination Interventions: SDoH screening performed - no acute resource challenges identified at this time Determined the patient does not have concerns with medication costs and/or adherence Education provided on the role of the care coordination team - no follow up desired at this time Instructed the patient to contact her primary care provider as needed         SDOH assessments and interventions completed:  Yes  SDOH Interventions Today    Flowsheet Row Most Recent Value  SDOH Interventions   Food Insecurity Interventions Intervention Not Indicated  Housing Interventions Intervention Not Indicated  Transportation Interventions Intervention Not Indicated  Utilities Interventions Intervention Not Indicated  Financial Strain Interventions Intervention Not Indicated        Care Coordination Interventions Activated:  Yes  Care Coordination Interventions:  Yes, provided   Follow up plan: No further intervention required.   Encounter Outcome:  Pt. Visit Completed   Daneen Schick, BSW, CDP Social Worker, Certified Dementia Practitioner [facility]  Care Coordination [phone]

## 2022-11-20 ENCOUNTER — Telehealth: Payer: Self-pay | Admitting: *Deleted

## 2022-11-20 NOTE — Telephone Encounter (Signed)
CALLED PATIENT TO INFORM OF CT FOR 12-04-22- ARRIVAL TIME- 1 PM @ [facility], NO RESTRICTIONS TO TEST, PATIENT TO RECEIVE RESULTS FROM DR. KINARD ON 12-06-22 @ 11:15 AM, SPOKE WITH PATIENT AND SHE IS AWARE OF THESE APPTS. AND THE INSTRUCTIONS

## 2022-12-04 ENCOUNTER — Ambulatory Visit (HOSPITAL_COMMUNITY)
Admission: RE | Admit: 2022-12-04 | Discharge: 2022-12-04 | Disposition: A | Discharge status: Home / Self Care | Payer: Medicare PPO | Source: Ambulatory Visit | Attending: Radiation Oncology | Admitting: Radiation Oncology

## 2022-12-04 DIAGNOSIS — C3490 Malignant neoplasm of unspecified part of unspecified bronchus or lung: Secondary | ICD-10-CM | POA: Diagnosis not present

## 2022-12-04 DIAGNOSIS — J439 Emphysema, unspecified: Secondary | ICD-10-CM | POA: Diagnosis not present

## 2022-12-04 DIAGNOSIS — R911 Solitary pulmonary nodule: Secondary | ICD-10-CM | POA: Insufficient documentation

## 2022-12-04 DIAGNOSIS — R918 Other nonspecific abnormal finding of lung field: Secondary | ICD-10-CM | POA: Diagnosis not present

## 2022-12-05 NOTE — Progress Notes (Signed)
Radiation Oncology         [phone] ________________________________  Name: Kimberly Jordan MRN: [id number]  Date: 12/06/2022  DOB: Jul 19, 1929  Follow-Up Visit Note  CC: Prince Solian, MD  Lajuana Matte, MD  No diagnosis found.  Diagnosis: The primary encounter diagnosis was Lung nodule. Diagnoses of History of breast cancer and Solitary pulmonary nodule were also pertinent to this visit.   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 She was last seen here for follow-up on 06/04/22.        Her most recent chest CT on 12/04/22 showed stability of the post treatment dense consolidation in the superior LLL which abuts the major fissure, stability of the medial-superior RLL nodule measuring 0.8 cm, and stable subpleural radiation fibrosis of the anterior right lung.        Other imaging performed in the interval includes a bilateral diagnostic mammogram on 06/07/22, for evaluation of a new palpable upper right breast lump x 1 month, which showed  corresponding benign calcifications in the area of palpable concern. Calcifications were also located at the prior lumpectomy site.   ***  Allergies:  is allergic to fosamax [alendronate], hydrocodone, indomethacin, penicillin g sodium, and tramadol.  Meds: Current Outpatient Medications  Medication Sig Dispense Refill   amLODipine (NORVASC) 10 MG tablet Take 1 tablet by mouth daily.     aspirin 81 MG tablet Take 81 mg by mouth daily.     Calcium Carbonate-Vitamin D (CALCIUM + D PO) Take 600 mg by mouth 3 (three) times daily.     carvedilol (COREG) 12.5 MG tablet TAKE ONE TABLET BY MOUTH TWICE DAILY WITH FOOD. 180 tablet 3   clobetasol cream (TEMOVATE) 0.86 % Apply 1 application topically as needed (For  itching on back).      CREON 24000 units CPEP Take 3 capsules by mouth 3 (three) times daily. Takes 2- 24000 and 1- 36000 TID     doxycycline (VIBRA-TABS) 100 MG tablet Take 100 mg by mouth every evening.      erythromycin ophthalmic ointment      MYRBETRIQ 50 MG TB24 tablet      OVER THE COUNTER MEDICATION Place 1 application  into both eyes as needed. occu scrub     simvastatin (ZOCOR) 20 MG tablet Take 20 mg by mouth every evening.      telmisartan (MICARDIS) 80 MG tablet Take 1 tablet (80 mg total) by mouth daily. 90 tablet 1   zoledronic acid (RECLAST) 5 MG/100ML SOLN injection Inject 5 mg into the vein once. Yearly medication,  given in Jan.     No current facility-administered medications for this encounter.    Physical Findings: The patient is in no acute distress. Patient is alert and oriented.  vitals were not taken for this visit. .  No significant changes. Lungs are clear to auscultation bilaterally. Heart has regular rate and rhythm. No palpable cervical, supraclavicular, or axillary adenopathy. Abdomen soft, non-tender, normal bowel sounds.   Lab Findings: Lab Results  Component Value Date   WBC 7.4 10/15/2014   HGB 11.8 (L) 10/15/2014   HCT 34.0 (L) 10/15/2014   MCV 96.3 10/15/2014   PLT 144 (L) 10/15/2014    Radiographic Findings: CT CHEST WO CONTRAST  Result Date: 12/05/2022 CLINICAL DATA:  Non-small  cell lung cancer restaging * Tracking Code: BO * EXAM: CT CHEST WITHOUT CONTRAST TECHNIQUE: Multidetector CT imaging of the chest was performed following the standard protocol without IV contrast. RADIATION DOSE REDUCTION: This exam was performed according to the departmental dose-optimization program which includes automated exposure control, adjustment of the mA and/or kV according to patient size and/or use of iterative reconstruction technique. COMPARISON:  06/01/2022 FINDINGS: Cardiovascular: Aortic atherosclerosis. Normal heart size. Unchanged dense calcification in  the vicinity of the aortic root and mitral annulus. Left coronary artery calcifications. No pericardial effusion. Mediastinum/Nodes: No enlarged mediastinal, hilar, or axillary lymph nodes. Surgical clips in the right axilla. Thyroid gland, trachea, and esophagus demonstrate no significant findings. Lungs/Pleura: Moderate centrilobular and paraseptal emphysema. Unchanged post treatment/post radiation dense consolidation of the superior segment left lower lobe abutting the major fissure, measuring 3.2 x 1.8 cm (series 5, image 46). Unchanged 0.8 cm nodule of the medial superior segment right lower lobe (series 5, image 65). Unchanged subpleural radiation fibrosis of the anterior right lung (series 5, image 79) no pleural effusion or pneumothorax. Upper Abdomen: No acute abnormality. Musculoskeletal: Postoperative dystrophic calcifications within the medial right breast (series 5, image 64). No acute osseous findings. IMPRESSION: 1. Unchanged post treatment/post radiation dense consolidation of the superior segment left lower lobe abutting the major fissure. 2. Unchanged 0.8 cm nodule of the medial superior segment right lower lobe. 3. Unchanged subpleural radiation fibrosis of the anterior right lung. 4. Emphysema. 5. Coronary artery disease. 6. Aortic atherosclerosis. Aortic Atherosclerosis (ICD10-I70.0) and Emphysema (ICD10-J43.9). Electronically Signed   By: Delanna Ahmadi M.D.   On: 12/05/2022 11:07    Impression: The primary encounter diagnosis was Lung nodule. Diagnoses of History of breast cancer and Solitary pulmonary nodule were also pertinent to this visit.   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  ***  Plan:  ***   *** minutes of total time was spent for this patient encounter, including preparation, face-to-face counseling with the patient and coordination of care, physical exam, and documentation of the  encounter. ____________________________________  Blair Promise, PhD, MD  This document serves as a record of services personally performed by Gery Pray, MD. It was created on his behalf by Roney Mans, a trained medical scribe. The creation of this record is based on the scribe's personal observations and the provider's statements to them. This document has been checked and approved by the attending provider.

## 2022-12-06 ENCOUNTER — Ambulatory Visit
Admission: RE | Admit: 2022-12-06 | Discharge: 2022-12-06 | Disposition: A | Discharge status: Home / Self Care | Payer: Medicare PPO | Source: Ambulatory Visit | Attending: Radiation Oncology | Admitting: Radiation Oncology

## 2022-12-06 ENCOUNTER — Other Ambulatory Visit: Payer: Self-pay

## 2022-12-06 ENCOUNTER — Encounter: Payer: Self-pay | Admitting: Radiation Oncology

## 2022-12-06 DIAGNOSIS — I2510 Atherosclerotic heart disease of native coronary artery without angina pectoris: Secondary | ICD-10-CM | POA: Diagnosis not present

## 2022-12-06 DIAGNOSIS — R918 Other nonspecific abnormal finding of lung field: Secondary | ICD-10-CM | POA: Insufficient documentation

## 2022-12-06 DIAGNOSIS — Z853 Personal history of malignant neoplasm of breast: Secondary | ICD-10-CM | POA: Diagnosis not present

## 2022-12-06 DIAGNOSIS — R911 Solitary pulmonary nodule: Secondary | ICD-10-CM | POA: Diagnosis not present

## 2022-12-06 DIAGNOSIS — I700 Atherosclerosis of aorta: Secondary | ICD-10-CM | POA: Insufficient documentation

## 2022-12-06 DIAGNOSIS — J432 Centrilobular emphysema: Secondary | ICD-10-CM | POA: Diagnosis not present

## 2022-12-06 NOTE — Progress Notes (Signed)
Kimberly Jordan is here today for follow up post radiation to the lung.  Lung Side: Left, patient completed treatment on 04/25/21.  Does the patient complain of any of the following: Pain: Patient report generalized pain.  Shortness of breath w/wo exertion: Yes, mostly on exertion. Cough: No Hemoptysis: No Pain with swallowing: No Swallowing/choking concerns: No Appetite: Good Weight:  Wt Readings from Last 3 Encounters:  12/06/22 98 lb 6.4 oz (44.6 kg)  06/04/22 100 lb 3 oz (45.4 kg)  03/01/22 103 lb (46.7 kg)   Energy Level: Good Post radiation skin Changes: No    Additional comments if applicable:   BP (!) 168/37 (BP Location: Right Arm, Patient Position: Sitting, Cuff Size: Normal)   Pulse 66   Temp (!) 96.8 F (36 C)   Resp 20   Ht 5' (1.524 m)   Wt 98 lb 6.4 oz (44.6 kg)   LMP 12/31/1992   SpO2 98%   BMI 19.22 kg/m

## 2022-12-07 ENCOUNTER — Ambulatory Visit: Payer: Medicare PPO | Attending: Cardiology | Admitting: Cardiology

## 2022-12-07 ENCOUNTER — Encounter: Payer: Self-pay | Admitting: Cardiology

## 2022-12-07 VITALS — BP 110/60 | HR 64 | Ht 60.0 in | Wt 100.0 lb

## 2022-12-07 DIAGNOSIS — I447 Left bundle-branch block, unspecified: Secondary | ICD-10-CM | POA: Diagnosis not present

## 2022-12-07 DIAGNOSIS — I10 Essential (primary) hypertension: Secondary | ICD-10-CM | POA: Diagnosis not present

## 2022-12-07 DIAGNOSIS — I428 Other cardiomyopathies: Secondary | ICD-10-CM

## 2022-12-07 NOTE — Patient Instructions (Signed)
Medication Instructions:  The current medical regimen is effective;  continue present plan and medications.  *If you need a refill on your cardiac medications before your next appointment, please call your pharmacy*  Follow-Up: At [facility], you and your health needs are our priority.  As part of our continuing mission to provide you with exceptional heart care, we have created designated Provider Care Teams.  These Care Teams include your primary Cardiologist (physician) and Advanced Practice Providers (APPs -  Physician Assistants and Nurse Practitioners) who all work together to provide you with the care you need, when you need it.  We recommend signing up for the patient portal called "MyChart".  Sign up information is provided on this After Visit Summary.  MyChart is used to connect with patients for Virtual Visits (Telemedicine).  Patients are able to view lab/test results, encounter notes, upcoming appointments, etc.  Non-urgent messages can be sent to your provider as well.   To learn more about what you can do with MyChart, go to [email or web address].    Your next appointment:   1 year(s)  The format for your next appointment:   In Person  Provider:   Mark Skains, MD      Important Information About Sugar       

## 2022-12-07 NOTE — Progress Notes (Signed)
Cardiology Office Note:    Date:  12/07/2022   ID:  Kimberly Jordan, DOB [birth year], MRN [id number]  PCP:  Prince Solian, MD   [place]  Cardiologist:  Candee Furbish, MD  Advanced Practice Provider:  No care team member to display Electrophysiologist:  None       Referring MD: Prince Solian, MD    History of Present Illness:    Kimberly Jordan is a 88 y.o. female here for the follow-up of cardiomyopathy.  Former [employer].  Feels well without any significant shortness of breath, no chest pain.  No syncope.  No fevers chills nausea vomiting.  Has been getting a lung nodule worked up with Dr. Servando Snare.  EF has been from 20 to 35% and first picked up during hospitalization in 2015 secondary to small bowel obstruction.  She had no obstructive coronary artery disease on cardiac catheterization.  Left bundle branch block noted.  No syncope.  She is going to the [facility] for [place] series.    Past Medical History:  Diagnosis Date   Blepharitis    Breast cancer [facility]) 1992   right breast    Ectopic pregnancy    History of radiation therapy 04/18/21-04/25/21   Left Lung- Dr. 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    Past Surgical History:  Procedure Laterality Date   CATARACT EXTRACTION  2010, 2011   2010 right eye, 2011 left eye   COLONOSCOPY W/ POLYPECTOMY     cyst removed  2012   from right wrist   ECTOPIC PREGNANCY SURGERY     salpingectomy   HAND SURGERY  2007   right hand-ruptured tendon   KNEE ARTHROSCOPY  1998   LAPAROTOMY N/A 03/22/2014   Procedure: EXPLORATORY LAPAROTOMY;  Surgeon: Merrie Roof, MD;  Location: [facility];  Service: General;  Laterality: N/A;   [place] N/A 03/25/2014   Procedure: LEFT HEART CATHETERIZATION WITH CORONARY ANGIOGRAM;  Surgeon: Jettie Booze, MD;  Location: [facility] CATH LAB;   Service: Cardiovascular;  Laterality: N/A;   ORIF ELBOW FRACTURE Left 10/15/2014   Procedure: OPEN REDUCTION INTERNAL FIXATION (ORIF) ELBOW/OLECRANON FRACTURE;  Surgeon: Renette Butters, MD;  Location: [place];  Service: Orthopedics;  Laterality: Left;  stryker elbow plate    TOTAL ABDOMINAL HYSTERECTOMY W/ BILATERAL SALPINGOOPHORECTOMY  1994    Current Medications: Current Meds  Medication Sig   amLODipine (NORVASC) 10 MG tablet Take 1 tablet by mouth daily.   aspirin 81 MG tablet Take 81 mg by mouth daily.   Calcium Carbonate-Vitamin D (CALCIUM + D PO) Take 600 mg by mouth 3 (three) times daily.   carvedilol (COREG) 12.5 MG tablet TAKE ONE TABLET BY MOUTH TWICE DAILY WITH FOOD.   clobetasol cream (TEMOVATE) 5.99 % Apply 1 application topically as needed (For itching on back).    CREON 24000 units CPEP Take 3 capsules by mouth 3 (three) times daily. Takes 2- 24000 and 1- 36000 TID   doxycycline (VIBRA-TABS) 100 MG tablet Take 100 mg by mouth every evening.    erythromycin ophthalmic ointment    MYRBETRIQ 50 MG TB24 tablet    OVER THE COUNTER MEDICATION Place 1 application  into both eyes as needed. occu scrub   simvastatin (ZOCOR) 20 MG tablet Take 20 mg by mouth every evening.    telmisartan (MICARDIS) 80 MG tablet Take  1 tablet (80 mg total) by mouth daily.   zoledronic acid (RECLAST) 5 MG/100ML SOLN injection Inject 5 mg into the vein once. Yearly medication,  given in Jan.     Allergies:   Fosamax [alendronate], Hydrocodone, Indomethacin, Penicillin g sodium, and Tramadol   Social History   Socioeconomic History   Marital status: Married    Spouse name: Not on file   Number of children: Not on file   Years of education: Not on file   Highest education level: Not on file  Occupational History   Not on file  Tobacco Use   Smoking status: Former   Smokeless tobacco: Never  Vaping Use   Vaping Use: Never used  Substance and Sexual Activity   Alcohol use: Yes     Alcohol/week: 5.0 standard drinks of alcohol    Types: 5 Glasses of wine per week    Comment: wine daily   Drug use: No   Sexual activity: Not Currently    Birth control/protection: Surgical    Comment: TAH/BSO  Other Topics Concern   Not on file  Social History Narrative   Not on file   Social Determinants of Health   Financial Resource Strain: Low Risk  (11/12/2022)   Overall Financial Resource Strain (CARDIA)    Difficulty of Paying Living Expenses: Not very hard  Food Insecurity: No Food Insecurity (11/12/2022)   Hunger Vital Sign    Worried About Running Out of Food in the Last Year: Never true    Ran Out of Food in the Last Year: Never true  Transportation Needs: No Transportation Needs (11/12/2022)   PRAPARE - Hydrologist (Medical): No    Lack of Transportation (Non-Medical): No  Physical Activity: Not on file  Stress: Not on file  Social Connections: Not on file     Family History: The patient's family history includes Arthritis in her mother; CVA in her father; Hypertension in her father and mother; Migraines in her father; Osteoporosis in her mother; Stroke in her father.  ROS:   Please see the history of present illness.     All other systems reviewed and are negative.  EKGs/Labs/Other Studies Reviewed:      EKG:  EKG is  ordered today.  The ekg ordered today demonstrates 12/07/2022-sinus rhythm 64 left bundle branch block no change.  Sinus rhythm 64 left bundle branch block  Recent Labs: No results found for requested labs within last 365 days.  Recent Lipid Panel No results found for: "CHOL", "TRIG", "HDL", "CHOLHDL", "VLDL", "LDLCALC", "LDLDIRECT"   LDL 78 hemoglobin 14.7 creatinine 0.7 ALT 20 platelets 163   Risk Assessment/Calculations:      Physical Exam:    VS:  BP 110/60 (BP Location: Left Arm, Patient Position: Sitting, Cuff Size: Normal)   Pulse 64   Ht 5' (1.524 m)   Wt 100 lb (45.4 kg)   LMP 12/31/1992    BMI 19.53 kg/m     Wt Readings from Last 3 Encounters:  12/07/22 100 lb (45.4 kg)  12/06/22 98 lb 6.4 oz (44.6 kg)  06/04/22 100 lb 3 oz (45.4 kg)     GEN:  Well nourished, well developed in no acute distress HEENT: Normal NECK: No JVD; No carotid bruits LYMPHATICS: No lymphadenopathy CARDIAC: RRR, no murmurs, rubs, gallops RESPIRATORY:  Clear to auscultation without rales, wheezing or rhonchi  ABDOMEN: Soft, non-tender, non-distended MUSCULOSKELETAL:  No edema; No deformity  SKIN: Warm and dry NEUROLOGIC:  Alert  and oriented x 3 PSYCHIATRIC:  Normal affect   ASSESSMENT:    1. NICM (nonischemic cardiomyopathy) ([place])   2. Essential hypertension   3. LBBB (left bundle branch block)     PLAN:    In order of problems listed above:  Nonischemic cardiomyopathy - Overall been doing quite well with NYHA class I-II symptoms on both carvedilol as well as angiotensin receptor blocker.  Still battling with arthritis which limits activity.  Since there have not been any significant changes symptomatically, we will not check another echocardiogram.  Left bundle branch block -No syncope.  No high risk symptoms.  No need for pacemaker.  Essential hypertension -Dr. Dagmar Hait has been monitoring.  Multidrug regimen as above.  No changes made.  Lung nodule -Dr. Everrett Coombe prior note reviewed.  Biopsy would be a significant risk for her.  Dr. Servando Snare previously discussed with radiation oncology about other options.  Has been seeing Dr. Luellen Pucker with radiation oncology.  Left lower lobe lung nodule suspected low-grade adenocarcinoma.  Has had 18 Gy as of now.    Medication Adjustments/Labs and Tests Ordered: Current medicines are reviewed at length with the patient today.  Concerns regarding medicines are outlined above.  Orders Placed This Encounter  Procedures   EKG 12-Lead   No orders of the defined types were placed in this encounter.   Patient Instructions  Medication  Instructions:  The current medical regimen is effective;  continue present plan and medications.  *If you need a refill on your cardiac medications before your next appointment, please call your pharmacy* Follow-Up: At [facility], you and your health needs are our priority.  As part of our continuing mission to provide you with exceptional heart care, we have created designated Provider Care Teams.  These Care Teams include your primary Cardiologist (physician) and Advanced Practice Providers (APPs -  Physician Assistants and Nurse Practitioners) who all work together to provide you with the care you need, when you need it.  We recommend signing up for the patient portal called "MyChart".  Sign up information is provided on this After Visit Summary.  MyChart is used to connect with patients for Virtual Visits (Telemedicine).  Patients are able to view lab/test results, encounter notes, upcoming appointments, etc.  Non-urgent messages can be sent to your provider as well.   To learn more about what you can do with MyChart, go to [email or web address].    Your next appointment:   1 year(s)  The format for your next appointment:   In Person  Provider:   Candee Furbish, MD      Important Information About Sugar         Signed, Candee Furbish, MD  12/07/2022 11:13 AM    [place]

## 2023-01-29 ENCOUNTER — Encounter: Payer: Self-pay | Admitting: Podiatry

## 2023-01-29 ENCOUNTER — Ambulatory Visit: Payer: Medicare PPO | Admitting: Podiatry

## 2023-01-29 VITALS — BP 125/62 | HR 67

## 2023-01-29 DIAGNOSIS — M79675 Pain in left toe(s): Secondary | ICD-10-CM

## 2023-01-29 DIAGNOSIS — M79674 Pain in right toe(s): Secondary | ICD-10-CM

## 2023-01-29 DIAGNOSIS — B351 Tinea unguium: Secondary | ICD-10-CM | POA: Diagnosis not present

## 2023-01-29 NOTE — Progress Notes (Signed)
This patient returns to the office for evaluation and treatment of long thick painful nails .  This patient is unable to trim his own nails since the patient cannot reach his feet.  Patient says the nails are painful walking and wearing his shoes.  He returns for preventive foot care services.  General Appearance  Alert, conversant and in no acute stress.  Vascular  Dorsalis pedis and posterior tibial  pulses are weakly  palpable  bilaterally.  Capillary return is within normal limits  bilaterally. Cold feet  Bilaterally. Absent digital hair.  Neurologic  Senn-Weinstein monofilament wire test within normal limits  bilaterally. Muscle power within normal limits bilaterally.  Nails Thick disfigured discolored nails with subungual debris  from hallux to fifth toes bilaterally. No evidence of bacterial infection or drainage bilaterally.  Orthopedic  No limitations of motion  feet .  No crepitus or effusions noted.  Hammer toes second  left.  HAV 1st MPJ  B/L.  Skin  normotropic skin with no porokeratosis noted bilaterally.  No signs of infections or ulcers noted.     Onychomycosis  Pain in toes right foot  Pain in toes left foot  Debridement  of nails  1-5  B/L with a nail nipper.  Nails were then filed using a dremel tool with no incidents.    RTC 3 months    Gardiner Barefoot DPM

## 2023-02-19 DIAGNOSIS — I428 Other cardiomyopathies: Secondary | ICD-10-CM | POA: Diagnosis not present

## 2023-02-19 DIAGNOSIS — N1330 Unspecified hydronephrosis: Secondary | ICD-10-CM | POA: Diagnosis not present

## 2023-02-19 DIAGNOSIS — R911 Solitary pulmonary nodule: Secondary | ICD-10-CM | POA: Diagnosis not present

## 2023-02-19 DIAGNOSIS — D692 Other nonthrombocytopenic purpura: Secondary | ICD-10-CM | POA: Diagnosis not present

## 2023-02-19 DIAGNOSIS — E785 Hyperlipidemia, unspecified: Secondary | ICD-10-CM | POA: Diagnosis not present

## 2023-02-19 DIAGNOSIS — H3530 Unspecified macular degeneration: Secondary | ICD-10-CM | POA: Diagnosis not present

## 2023-02-19 DIAGNOSIS — I10 Essential (primary) hypertension: Secondary | ICD-10-CM | POA: Diagnosis not present

## 2023-02-19 DIAGNOSIS — K8681 Exocrine pancreatic insufficiency: Secondary | ICD-10-CM | POA: Diagnosis not present

## 2023-02-19 DIAGNOSIS — M810 Age-related osteoporosis without current pathological fracture: Secondary | ICD-10-CM | POA: Diagnosis not present

## 2023-03-25 DIAGNOSIS — L565 Disseminated superficial actinic porokeratosis (DSAP): Secondary | ICD-10-CM | POA: Diagnosis not present

## 2023-03-25 DIAGNOSIS — L570 Actinic keratosis: Secondary | ICD-10-CM | POA: Diagnosis not present

## 2023-03-25 DIAGNOSIS — L821 Other seborrheic keratosis: Secondary | ICD-10-CM | POA: Diagnosis not present

## 2023-03-25 DIAGNOSIS — L853 Xerosis cutis: Secondary | ICD-10-CM | POA: Diagnosis not present

## 2023-03-25 DIAGNOSIS — Z85828 Personal history of other malignant neoplasm of skin: Secondary | ICD-10-CM | POA: Diagnosis not present

## 2023-03-27 DIAGNOSIS — H353221 Exudative age-related macular degeneration, left eye, with active choroidal neovascularization: Secondary | ICD-10-CM | POA: Diagnosis not present

## 2023-03-27 DIAGNOSIS — Z961 Presence of intraocular lens: Secondary | ICD-10-CM | POA: Diagnosis not present

## 2023-03-27 DIAGNOSIS — H3562 Retinal hemorrhage, left eye: Secondary | ICD-10-CM | POA: Diagnosis not present

## 2023-03-28 DIAGNOSIS — H353221 Exudative age-related macular degeneration, left eye, with active choroidal neovascularization: Secondary | ICD-10-CM | POA: Diagnosis not present

## 2023-03-28 DIAGNOSIS — H353132 Nonexudative age-related macular degeneration, bilateral, intermediate dry stage: Secondary | ICD-10-CM | POA: Diagnosis not present

## 2023-04-01 DIAGNOSIS — H02051 Trichiasis without entropian right upper eyelid: Secondary | ICD-10-CM | POA: Diagnosis not present

## 2023-04-01 DIAGNOSIS — T1512XA Foreign body in conjunctival sac, left eye, initial encounter: Secondary | ICD-10-CM | POA: Diagnosis not present

## 2023-04-01 DIAGNOSIS — H02054 Trichiasis without entropian left upper eyelid: Secondary | ICD-10-CM | POA: Diagnosis not present

## 2023-04-30 ENCOUNTER — Ambulatory Visit: Payer: Medicare PPO | Admitting: Podiatry

## 2023-05-02 DIAGNOSIS — H353132 Nonexudative age-related macular degeneration, bilateral, intermediate dry stage: Secondary | ICD-10-CM | POA: Diagnosis not present

## 2023-05-02 DIAGNOSIS — H353221 Exudative age-related macular degeneration, left eye, with active choroidal neovascularization: Secondary | ICD-10-CM | POA: Diagnosis not present

## 2023-05-21 ENCOUNTER — Ambulatory Visit: Payer: Medicare PPO | Admitting: Podiatry

## 2023-05-21 ENCOUNTER — Encounter: Payer: Self-pay | Admitting: Podiatry

## 2023-05-21 DIAGNOSIS — M79675 Pain in left toe(s): Secondary | ICD-10-CM | POA: Diagnosis not present

## 2023-05-21 DIAGNOSIS — M79674 Pain in right toe(s): Secondary | ICD-10-CM

## 2023-05-21 DIAGNOSIS — B351 Tinea unguium: Secondary | ICD-10-CM

## 2023-05-21 NOTE — Progress Notes (Signed)
This patient returns to the office for evaluation and treatment of long thick painful nails .  This patient is unable to trim his own nails since the patient cannot reach his feet.  Patient says the nails are painful walking and wearing his shoes.  He returns for preventive foot care services.  General Appearance  Alert, conversant and in no acute stress.  Vascular  Dorsalis pedis and posterior tibial  pulses are weakly  palpable  bilaterally.  Capillary return is within normal limits  bilaterally. Cold feet  Bilaterally. Absent digital hair.  Neurologic  Senn-Weinstein monofilament wire test within normal limits  bilaterally. Muscle power within normal limits bilaterally.  Nails Thick disfigured discolored nails with subungual debris  from hallux to fifth toes bilaterally. No evidence of bacterial infection or drainage bilaterally.  Orthopedic  No limitations of motion  feet .  No crepitus or effusions noted.  Hammer toes second  left.  HAV 1st MPJ  B/L.  Skin  normotropic skin with no porokeratosis noted bilaterally.  No signs of infections or ulcers noted.     Onychomycosis  Pain in toes right foot  Pain in toes left foot  Debridement  of nails  1-5  B/L with a nail nipper.  Nails were then filed using a dremel tool with no incidents.    RTC 3 months    Anny Sayler DPM  

## 2023-05-23 ENCOUNTER — Telehealth: Payer: Self-pay | Admitting: *Deleted

## 2023-05-23 NOTE — Telephone Encounter (Signed)
CALLED PATIENT TO INFORM OF CT FOR 06-07-23- ARRIVAL TIME- 4:15 PM @ [facility], NO RESTRICTIONS TO TEST, PATIENT TO RECEIVE RESULTS FROM DR. KINARD ON 06-10-23- @ 10:30 AM, LVM FOR A RETURN CALL

## 2023-06-06 DIAGNOSIS — H353221 Exudative age-related macular degeneration, left eye, with active choroidal neovascularization: Secondary | ICD-10-CM | POA: Diagnosis not present

## 2023-06-06 DIAGNOSIS — H353132 Nonexudative age-related macular degeneration, bilateral, intermediate dry stage: Secondary | ICD-10-CM | POA: Diagnosis not present

## 2023-06-06 DIAGNOSIS — H02133 Senile ectropion of right eye, unspecified eyelid: Secondary | ICD-10-CM | POA: Diagnosis not present

## 2023-06-07 ENCOUNTER — Ambulatory Visit (HOSPITAL_COMMUNITY)
Admission: RE | Admit: 2023-06-07 | Discharge: 2023-06-07 | Disposition: A | Discharge status: Home / Self Care | Payer: Medicare PPO | Source: Ambulatory Visit | Attending: Radiology | Admitting: Radiology

## 2023-06-07 DIAGNOSIS — R911 Solitary pulmonary nodule: Secondary | ICD-10-CM | POA: Insufficient documentation

## 2023-06-07 DIAGNOSIS — R918 Other nonspecific abnormal finding of lung field: Secondary | ICD-10-CM | POA: Diagnosis not present

## 2023-06-07 DIAGNOSIS — J439 Emphysema, unspecified: Secondary | ICD-10-CM | POA: Diagnosis not present

## 2023-06-10 ENCOUNTER — Ambulatory Visit: Payer: Self-pay | Admitting: Radiation Oncology

## 2023-06-12 NOTE — Progress Notes (Signed)
Kimberly Jordan is here today for follow up post radiation to the lung.  Lung Side: Left, patient completed treatment on 04/25/21.  Does the patient complain of any of the following: Pain: Denies any chest or back pain. Does report generalized achiness and right shoulder pain Shortness of breath w/wo exertion: Denies SOB unless she's having to ambulate somewhere quickly Cough: Denies Hemoptysis: Denies Pain with swallowing: Denies Swallowing/choking concerns: Denies Appetite: Reports a healthy appetite Wt Readings from Last 3 Encounters:  06/17/23 95 lb 3.2 oz (43.2 kg)  12/07/22 100 lb (45.4 kg)  12/06/22 98 lb 6.4 oz (44.6 kg)   Energy Level: Reports a stable energy level. Naps occasionally during the day as needed Post radiation skin Changes: Denies  Additional comments if applicable:

## 2023-06-17 ENCOUNTER — Ambulatory Visit
Admission: RE | Admit: 2023-06-17 | Discharge: 2023-06-17 | Disposition: A | Discharge status: Home / Self Care | Payer: Medicare PPO | Source: Ambulatory Visit | Attending: Radiation Oncology | Admitting: Radiation Oncology

## 2023-06-17 ENCOUNTER — Other Ambulatory Visit: Payer: Self-pay

## 2023-06-17 VITALS — BP 124/51 | HR 64 | Temp 97.5°F | Resp 20 | Ht 60.0 in | Wt 95.2 lb

## 2023-06-17 DIAGNOSIS — Z923 Personal history of irradiation: Secondary | ICD-10-CM | POA: Insufficient documentation

## 2023-06-17 DIAGNOSIS — Z79899 Other long term (current) drug therapy: Secondary | ICD-10-CM | POA: Diagnosis not present

## 2023-06-17 DIAGNOSIS — Z853 Personal history of malignant neoplasm of breast: Secondary | ICD-10-CM | POA: Insufficient documentation

## 2023-06-17 DIAGNOSIS — R911 Solitary pulmonary nodule: Secondary | ICD-10-CM

## 2023-06-17 NOTE — Progress Notes (Signed)
Radiation Oncology         [phone] ________________________________  Name: Kimberly Jordan MRN: [id number]  Date: 06/17/2023  DOB: 03-Mar-1929  Follow-Up Visit Note  CC: Chilton Greathouse, MD  Corliss Skains, MD    ICD-10-CM   1. Solitary pulmonary nodule  R91.1 CT CHEST WO CONTRAST      Diagnosis: The primary encounter diagnosis was Lung nodule. Diagnoses of History of breast cancer and Solitary pulmonary nodule were also pertinent to this visit.   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 She was last seen here for follow-up on 12/06/22.   Her most recent chest CT on 06/07/23 demonstrated stable appearing post treatment related changes with consolidation abutting the fissure in the left lung, and stability of the 8 mm ground glass nodule in the medial right lower lobe. CT otherwise showed no new nodules or evidence of acute airspace disease.  On evaluation today patient denies any pain within the chest area significant cough or hemoptysis.  She denies any new breathing issues.  She is having more problems with her vision related to macular degeneration.                               Allergies:  is allergic to fosamax [alendronate], hydrocodone, indomethacin, penicillin g sodium, and tramadol.  Meds: Current Outpatient Medications  Medication Sig Dispense Refill   amLODipine (NORVASC) 10 MG tablet Take 1 tablet by mouth daily.     aspirin 81 MG tablet Take 81 mg by mouth daily.     Calcium Carbonate-Vitamin D (CALCIUM + D PO) Take 600 mg by mouth 3 (three) times daily.     carvedilol (COREG) 12.5 MG tablet TAKE ONE TABLET BY MOUTH TWICE DAILY WITH FOOD. 180 tablet 3   clobetasol cream (TEMOVATE) 0.05 % Apply 1 application topically as needed (For  itching on back).      CREON 24000 units CPEP Take 3 capsules by mouth 3 (three) times daily. Takes 2- 24000 and 1- 36000 TID     doxycycline (VIBRA-TABS) 100 MG tablet Take 100 mg by mouth every evening.      erythromycin ophthalmic ointment      MYRBETRIQ 50 MG TB24 tablet      OVER THE COUNTER MEDICATION Place 1 application  into both eyes as needed. occu scrub     simvastatin (ZOCOR) 20 MG tablet Take 20 mg by mouth every evening.      telmisartan (MICARDIS) 80 MG tablet Take 1 tablet (80 mg total) by mouth daily. 90 tablet 1   zoledronic acid (RECLAST) 5 MG/100ML SOLN injection Inject 5 mg into the vein once. Yearly medication,  given in Jan.     No current facility-administered medications for this encounter.    Physical Findings: The patient is in no acute distress. Patient is alert and oriented.  height is 5' (1.524 m) and weight is 95 lb 3.2 oz (43.2 kg). Her temperature is 97.5 F (36.4 C) (abnormal). Her blood pressure is 124/51 (abnormal) and her pulse is 64. Her respiration is 20 and oxygen saturation is 96%. . Lungs are clear to auscultation bilaterally. Heart has regular rate and rhythm. No palpable cervical, supraclavicular, or axillary adenopathy. Abdomen soft, non-tender, normal  bowel sounds.   Lab Findings: Lab Results  Component Value Date   WBC 7.4 10/15/2014   HGB 11.8 (L) 10/15/2014   HCT 34.0 (L) 10/15/2014   MCV 96.3 10/15/2014   PLT 144 (L) 10/15/2014    Radiographic Findings: CT Chest Wo Contrast  Result Date: 06/15/2023 CLINICAL DATA:  89 year old for nodule follow-up. Multiple pulmonary nodules. Radiologic records indicate history of left lung cancer. EXAM: CT CHEST WITHOUT CONTRAST TECHNIQUE: Multidetector CT imaging of the chest was performed following the standard protocol without IV contrast. RADIATION DOSE REDUCTION: This exam was performed according to the departmental dose-optimization program which includes automated exposure control, adjustment of  the mA and/or kV according to patient size and/or use of iterative reconstruction technique. COMPARISON:  Most recent chest CT 12/04/2022., multiple priors reviewed. FINDINGS: Cardiovascular: Normal heart size. No pericardial effusion. Dense calcification in the region of the mitral annulus and aortic root, as before. Coronary artery calcifications. Atherosclerosis of the thoracic aorta. Mediastinum/Nodes: No bulky mediastinal adenopathy on this unenhanced exam. Right axillary surgical clips, no axillary adenopathy. Patulous esophagus. No esophageal wall thickening. Lungs/Pleura: Stable appearance of post treatment related change with consolidation abutting the fissure in the left lung, 3.1 x 1.8 cm, series 7, image 48. unchanged 8 mm ground-glass nodule in the medial right lower lobe series 7, image 69. No new nodules or acute airspace disease. Again seen emphysema. 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 No pleural fluid. Upper Abdomen: Tiny subcentimeter hypodensities in the liver are unchanged from prior. No acute upper abdominal findings. Musculoskeletal: Exaggerated thoracic kyphosis. Remote left posterior rib fracture. No suspicious bone lesion or acute osseous findings. Stable coarse calcifications in the right breast, typically dystrophic. IMPRESSION: 1. Stable appearance of post treatment related change with consolidation abutting the fissure in the left lung. 2. Unchanged 8 mm ground-glass nodule in the medial right lower lobe. 3. No new nodules or acute airspace disease. Aortic Atherosclerosis (ICD10-I70.0) and Emphysema (ICD10-J43.9). Electronically Signed   By: Narda Rutherford M.D.   On: 06/15/2023 16:39    Impression: The primary encounter diagnosis was Lung nodule. Diagnoses of History of breast cancer and Solitary pulmonary nodule were also pertinent to this visit.   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  Recent chest CT scan favorable.  Plan: Routine follow-up in 6 months.  Prior to this follow-up appointment the patient will have another chest CT scan.   25 minutes of total time was spent for this patient encounter, including preparation, face-to-face counseling with the patient and coordination of care, physical exam, and documentation of the encounter. ____________________________________  Billie Lade, PhD, MD  This document serves as a record of services personally performed by Antony Blackbird, MD. It was created on his behalf by Neena Rhymes, a trained medical scribe. The creation of this record is based on the scribe's personal observations and the provider's statements to them. This document has been checked and approved by the attending provider.

## 2023-07-08 DIAGNOSIS — H353132 Nonexudative age-related macular degeneration, bilateral, intermediate dry stage: Secondary | ICD-10-CM | POA: Diagnosis not present

## 2023-07-08 DIAGNOSIS — H02133 Senile ectropion of right eye, unspecified eyelid: Secondary | ICD-10-CM | POA: Diagnosis not present

## 2023-07-08 DIAGNOSIS — H353221 Exudative age-related macular degeneration, left eye, with active choroidal neovascularization: Secondary | ICD-10-CM | POA: Diagnosis not present

## 2023-08-19 DIAGNOSIS — H02133 Senile ectropion of right eye, unspecified eyelid: Secondary | ICD-10-CM | POA: Diagnosis not present

## 2023-08-19 DIAGNOSIS — H353132 Nonexudative age-related macular degeneration, bilateral, intermediate dry stage: Secondary | ICD-10-CM | POA: Diagnosis not present

## 2023-08-19 DIAGNOSIS — H353221 Exudative age-related macular degeneration, left eye, with active choroidal neovascularization: Secondary | ICD-10-CM | POA: Diagnosis not present

## 2023-08-21 ENCOUNTER — Encounter: Payer: Self-pay | Admitting: Podiatry

## 2023-08-21 ENCOUNTER — Ambulatory Visit: Payer: Medicare PPO | Admitting: Podiatry

## 2023-08-21 DIAGNOSIS — M79674 Pain in right toe(s): Secondary | ICD-10-CM

## 2023-08-21 DIAGNOSIS — M79675 Pain in left toe(s): Secondary | ICD-10-CM | POA: Diagnosis not present

## 2023-08-21 DIAGNOSIS — B351 Tinea unguium: Secondary | ICD-10-CM | POA: Diagnosis not present

## 2023-08-21 NOTE — Progress Notes (Signed)
This patient returns to the office for evaluation and treatment of long thick painful nails .  This patient is unable to trim his own nails since the patient cannot reach his feet.  Patient says the nails are painful walking and wearing his shoes.  He returns for preventive foot care services.  General Appearance  Alert, conversant and in no acute stress.  Vascular  Dorsalis pedis and posterior tibial  pulses are weakly  palpable  bilaterally.  Capillary return is within normal limits  bilaterally. Cold feet  Bilaterally. Absent digital hair.  Neurologic  Senn-Weinstein monofilament wire test within normal limits  bilaterally. Muscle power within normal limits bilaterally.  Nails Thick disfigured discolored nails with subungual debris  from hallux to fifth toes bilaterally. No evidence of bacterial infection or drainage bilaterally.  Orthopedic  No limitations of motion  feet .  No crepitus or effusions noted.  Hammer toes second  left.  HAV 1st MPJ  B/L.  Skin  normotropic skin with no porokeratosis noted bilaterally.  No signs of infections or ulcers noted.     Onychomycosis  Pain in toes right foot  Pain in toes left foot  Debridement  of nails  1-5  B/L with a nail nipper.  Nails were then filed using a dremel tool with no incidents.    RTC 3 months    Gardiner Barefoot DPM

## 2023-09-10 DIAGNOSIS — I10 Essential (primary) hypertension: Secondary | ICD-10-CM | POA: Diagnosis not present

## 2023-09-10 DIAGNOSIS — E785 Hyperlipidemia, unspecified: Secondary | ICD-10-CM | POA: Diagnosis not present

## 2023-09-23 DIAGNOSIS — E785 Hyperlipidemia, unspecified: Secondary | ICD-10-CM | POA: Diagnosis not present

## 2023-09-23 DIAGNOSIS — C3492 Malignant neoplasm of unspecified part of left bronchus or lung: Secondary | ICD-10-CM | POA: Diagnosis not present

## 2023-09-23 DIAGNOSIS — H532 Diplopia: Secondary | ICD-10-CM | POA: Diagnosis not present

## 2023-09-23 DIAGNOSIS — M810 Age-related osteoporosis without current pathological fracture: Secondary | ICD-10-CM | POA: Diagnosis not present

## 2023-09-23 DIAGNOSIS — Z1339 Encounter for screening examination for other mental health and behavioral disorders: Secondary | ICD-10-CM | POA: Diagnosis not present

## 2023-09-23 DIAGNOSIS — H353221 Exudative age-related macular degeneration, left eye, with active choroidal neovascularization: Secondary | ICD-10-CM | POA: Diagnosis not present

## 2023-09-23 DIAGNOSIS — Z0000 Encounter for general adult medical examination without abnormal findings: Secondary | ICD-10-CM | POA: Diagnosis not present

## 2023-09-23 DIAGNOSIS — D692 Other nonthrombocytopenic purpura: Secondary | ICD-10-CM | POA: Diagnosis not present

## 2023-09-23 DIAGNOSIS — I10 Essential (primary) hypertension: Secondary | ICD-10-CM | POA: Diagnosis not present

## 2023-09-23 DIAGNOSIS — Z23 Encounter for immunization: Secondary | ICD-10-CM | POA: Diagnosis not present

## 2023-09-23 DIAGNOSIS — N3281 Overactive bladder: Secondary | ICD-10-CM | POA: Diagnosis not present

## 2023-09-23 DIAGNOSIS — Z1331 Encounter for screening for depression: Secondary | ICD-10-CM | POA: Diagnosis not present

## 2023-09-23 DIAGNOSIS — Z853 Personal history of malignant neoplasm of breast: Secondary | ICD-10-CM | POA: Diagnosis not present

## 2023-09-25 DIAGNOSIS — L821 Other seborrheic keratosis: Secondary | ICD-10-CM | POA: Diagnosis not present

## 2023-09-25 DIAGNOSIS — L570 Actinic keratosis: Secondary | ICD-10-CM | POA: Diagnosis not present

## 2023-09-25 DIAGNOSIS — L812 Freckles: Secondary | ICD-10-CM | POA: Diagnosis not present

## 2023-09-25 DIAGNOSIS — Z85828 Personal history of other malignant neoplasm of skin: Secondary | ICD-10-CM | POA: Diagnosis not present

## 2023-10-07 DIAGNOSIS — Z961 Presence of intraocular lens: Secondary | ICD-10-CM | POA: Diagnosis not present

## 2023-10-07 DIAGNOSIS — H353132 Nonexudative age-related macular degeneration, bilateral, intermediate dry stage: Secondary | ICD-10-CM | POA: Diagnosis not present

## 2023-10-07 DIAGNOSIS — H532 Diplopia: Secondary | ICD-10-CM | POA: Diagnosis not present

## 2023-10-07 DIAGNOSIS — H02133 Senile ectropion of right eye, unspecified eyelid: Secondary | ICD-10-CM | POA: Diagnosis not present

## 2023-10-07 DIAGNOSIS — M436 Torticollis: Secondary | ICD-10-CM | POA: Diagnosis not present

## 2023-10-07 DIAGNOSIS — H353221 Exudative age-related macular degeneration, left eye, with active choroidal neovascularization: Secondary | ICD-10-CM | POA: Diagnosis not present

## 2023-10-10 DIAGNOSIS — H5021 Vertical strabismus, right eye: Secondary | ICD-10-CM | POA: Diagnosis not present

## 2023-10-10 DIAGNOSIS — H532 Diplopia: Secondary | ICD-10-CM | POA: Diagnosis not present

## 2023-10-22 DIAGNOSIS — H524 Presbyopia: Secondary | ICD-10-CM | POA: Diagnosis not present

## 2023-10-22 DIAGNOSIS — H353221 Exudative age-related macular degeneration, left eye, with active choroidal neovascularization: Secondary | ICD-10-CM | POA: Diagnosis not present

## 2023-10-22 DIAGNOSIS — H0100A Unspecified blepharitis right eye, upper and lower eyelids: Secondary | ICD-10-CM | POA: Diagnosis not present

## 2023-10-22 DIAGNOSIS — H5213 Myopia, bilateral: Secondary | ICD-10-CM | POA: Diagnosis not present

## 2023-10-22 DIAGNOSIS — H11002 Unspecified pterygium of left eye: Secondary | ICD-10-CM | POA: Diagnosis not present

## 2023-10-22 DIAGNOSIS — H02105 Unspecified ectropion of left lower eyelid: Secondary | ICD-10-CM | POA: Diagnosis not present

## 2023-10-22 DIAGNOSIS — H02102 Unspecified ectropion of right lower eyelid: Secondary | ICD-10-CM | POA: Diagnosis not present

## 2023-10-22 DIAGNOSIS — H04123 Dry eye syndrome of bilateral lacrimal glands: Secondary | ICD-10-CM | POA: Diagnosis not present

## 2023-11-25 ENCOUNTER — Encounter: Payer: Self-pay | Admitting: Podiatry

## 2023-11-25 ENCOUNTER — Ambulatory Visit: Payer: Medicare PPO | Admitting: Podiatry

## 2023-11-25 DIAGNOSIS — M79674 Pain in right toe(s): Secondary | ICD-10-CM | POA: Diagnosis not present

## 2023-11-25 DIAGNOSIS — M79675 Pain in left toe(s): Secondary | ICD-10-CM

## 2023-11-25 DIAGNOSIS — H02133 Senile ectropion of right eye, unspecified eyelid: Secondary | ICD-10-CM | POA: Diagnosis not present

## 2023-11-25 DIAGNOSIS — B351 Tinea unguium: Secondary | ICD-10-CM | POA: Diagnosis not present

## 2023-11-25 DIAGNOSIS — H353132 Nonexudative age-related macular degeneration, bilateral, intermediate dry stage: Secondary | ICD-10-CM | POA: Diagnosis not present

## 2023-11-25 DIAGNOSIS — H353221 Exudative age-related macular degeneration, left eye, with active choroidal neovascularization: Secondary | ICD-10-CM | POA: Diagnosis not present

## 2023-11-25 DIAGNOSIS — Z961 Presence of intraocular lens: Secondary | ICD-10-CM | POA: Diagnosis not present

## 2023-11-25 NOTE — Progress Notes (Signed)
This patient returns to the office for evaluation and treatment of long thick painful nails .  This patient is unable to trim his own nails since the patient cannot reach his feet.  Patient says the nails are painful walking and wearing his shoes.  He returns for preventive foot care services.  General Appearance  Alert, conversant and in no acute stress.  Vascular  Dorsalis pedis and posterior tibial  pulses are weakly  palpable  bilaterally.  Capillary return is within normal limits  bilaterally. Cold feet  Bilaterally. Absent digital hair.  Neurologic  Senn-Weinstein monofilament wire test within normal limits  bilaterally. Muscle power within normal limits bilaterally.  Nails Thick disfigured discolored nails with subungual debris  from hallux to fifth toes bilaterally. No evidence of bacterial infection or drainage bilaterally.  Orthopedic  No limitations of motion  feet .  No crepitus or effusions noted.  Hammer toes second  left.  HAV 1st MPJ  B/L.  Skin  normotropic skin with no porokeratosis noted bilaterally.  No signs of infections or ulcers noted.     Onychomycosis  Pain in toes right foot  Pain in toes left foot  Debridement  of nails  1-5  B/L with a nail nipper.  Nails were then filed using a dremel tool with no incidents.    RTC 3 months    Helane Gunther DPM

## 2023-12-10 ENCOUNTER — Telehealth: Payer: Self-pay | Admitting: *Deleted

## 2023-12-10 NOTE — Telephone Encounter (Signed)
Called patient to inform of Ct for 12-17-23 - arrival time- 4:15 pm on 12-17-23, no restrictions to scan, patient to receive results from Dr. Roselind Messier on 12-19-23 @ 11:45 am, spoke with patient and she is aware of these appts. and the instructions

## 2023-12-17 ENCOUNTER — Ambulatory Visit (HOSPITAL_COMMUNITY)
Admission: RE | Admit: 2023-12-17 | Discharge: 2023-12-17 | Disposition: A | Discharge status: Home / Self Care | Payer: Medicare PPO | Source: Ambulatory Visit | Attending: Radiation Oncology | Admitting: Radiation Oncology

## 2023-12-17 DIAGNOSIS — R911 Solitary pulmonary nodule: Secondary | ICD-10-CM | POA: Diagnosis not present

## 2023-12-17 DIAGNOSIS — R918 Other nonspecific abnormal finding of lung field: Secondary | ICD-10-CM | POA: Diagnosis not present

## 2023-12-17 DIAGNOSIS — J432 Centrilobular emphysema: Secondary | ICD-10-CM | POA: Diagnosis not present

## 2023-12-17 DIAGNOSIS — C3490 Malignant neoplasm of unspecified part of unspecified bronchus or lung: Secondary | ICD-10-CM | POA: Diagnosis not present

## 2023-12-18 NOTE — Progress Notes (Signed)
Radiation Oncology         [phone] ________________________________  Name: Kimberly Jordan MRN: [id number]  Date: 12/19/2023  DOB: [birth year]  Follow-Up Visit Note  CC: Chilton Greathouse, MD  Corliss Skains, MD  No diagnosis found.  Diagnosis:  The primary encounter diagnosis was Lung nodule. Diagnoses of History of breast cancer and Solitary pulmonary nodule were also pertinent to this visit.   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 She was last seen here for follow-up on 06/17/23.          Her most recent chest CT performed on 12/17/23 demonstrates: ***.  No other significant interval history since the patient was last seen for follow-up.   ***                       Allergies:  is allergic to fosamax [alendronate], hydrocodone, indomethacin, penicillin g sodium, and tramadol.  Meds: Current Outpatient Medications  Medication Sig Dispense Refill   amLODipine (NORVASC) 10 MG tablet Take 1 tablet by mouth daily.     aspirin 81 MG tablet Take 81 mg by mouth daily.     Calcium Carbonate-Vitamin D (CALCIUM + D PO) Take 600 mg by mouth 3 (three) times daily.     carvedilol (COREG) 12.5 MG tablet TAKE ONE TABLET BY MOUTH TWICE DAILY WITH FOOD. 180 tablet 3   clobetasol cream (TEMOVATE) 0.05 % Apply 1 application topically as needed (For itching on back).      CREON 24000 units CPEP Take 3 capsules by mouth 3 (three) times daily. Takes 2- 24000 and 1- 36000 TID     doxycycline (VIBRA-TABS) 100 MG tablet Take 100 mg by mouth every evening.      erythromycin ophthalmic ointment      MYRBETRIQ 50 MG TB24 tablet      OVER THE COUNTER MEDICATION Place 1 application  into both eyes as needed. occu scrub     simvastatin (ZOCOR) 20 MG tablet Take 20 mg  by mouth every evening.      telmisartan (MICARDIS) 80 MG tablet Take 1 tablet (80 mg total) by mouth daily. 90 tablet 1   zoledronic acid (RECLAST) 5 MG/100ML SOLN injection Inject 5 mg into the vein once. Yearly medication,  given in Jan.     No current facility-administered medications for this encounter.    Physical Findings: The patient is in no acute distress. Patient is alert and oriented.  vitals were not taken for this visit. .  No significant changes. Lungs are clear to auscultation bilaterally. Heart has regular rate and rhythm. No palpable cervical, supraclavicular, or axillary adenopathy. Abdomen soft, non-tender, normal bowel sounds.   Lab Findings: Lab Results  Component Value Date   WBC 7.4 10/15/2014   HGB 11.8 (L) 10/15/2014   HCT 34.0 (L) 10/15/2014   MCV 96.3 10/15/2014   PLT 144 (L) 10/15/2014    Radiographic Findings: No results found.  Impression:  The primary encounter diagnosis was Lung nodule. Diagnoses of History of breast cancer and Solitary pulmonary nodule were also pertinent to this visit.   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  ***  Plan:  ***   *** minutes  of total time was spent for this patient encounter, including preparation, face-to-face counseling with the patient and coordination of care, physical exam, and documentation of the encounter. ____________________________________  Billie Lade, PhD, MD  This document serves as a record of services personally performed by Antony Blackbird, MD. It was created on his behalf by Neena Rhymes, a trained medical scribe. The creation of this record is based on the scribe's personal observations and the provider's statements to them. This document has been checked and approved by the attending provider.

## 2023-12-19 ENCOUNTER — Ambulatory Visit
Admission: RE | Admit: 2023-12-19 | Discharge: 2023-12-19 | Disposition: A | Discharge status: Home / Self Care | Payer: Medicare PPO | Source: Ambulatory Visit | Attending: Radiation Oncology | Admitting: Radiation Oncology

## 2023-12-19 ENCOUNTER — Encounter: Payer: Self-pay | Admitting: Radiation Oncology

## 2023-12-19 VITALS — BP 145/69 | HR 64 | Temp 97.5°F | Resp 18 | Ht 60.0 in | Wt 95.5 lb

## 2023-12-19 DIAGNOSIS — J432 Centrilobular emphysema: Secondary | ICD-10-CM | POA: Insufficient documentation

## 2023-12-19 DIAGNOSIS — I700 Atherosclerosis of aorta: Secondary | ICD-10-CM | POA: Insufficient documentation

## 2023-12-19 DIAGNOSIS — Z7982 Long term (current) use of aspirin: Secondary | ICD-10-CM | POA: Insufficient documentation

## 2023-12-19 DIAGNOSIS — M4856XA Collapsed vertebra, not elsewhere classified, lumbar region, initial encounter for fracture: Secondary | ICD-10-CM | POA: Diagnosis not present

## 2023-12-19 DIAGNOSIS — Z79899 Other long term (current) drug therapy: Secondary | ICD-10-CM | POA: Insufficient documentation

## 2023-12-19 DIAGNOSIS — Z923 Personal history of irradiation: Secondary | ICD-10-CM | POA: Insufficient documentation

## 2023-12-19 DIAGNOSIS — Z853 Personal history of malignant neoplasm of breast: Secondary | ICD-10-CM | POA: Insufficient documentation

## 2023-12-19 DIAGNOSIS — R918 Other nonspecific abnormal finding of lung field: Secondary | ICD-10-CM | POA: Insufficient documentation

## 2023-12-19 DIAGNOSIS — I2510 Atherosclerotic heart disease of native coronary artery without angina pectoris: Secondary | ICD-10-CM | POA: Insufficient documentation

## 2023-12-19 DIAGNOSIS — R911 Solitary pulmonary nodule: Secondary | ICD-10-CM

## 2023-12-19 NOTE — Progress Notes (Signed)
Kimberly Jordan is here today for follow up post radiation to the lung.  Lung Side: Left   Does the patient complain of any of the following: Pain: Denies Shortness of breath w/wo exertion: Denies Cough: Denies Hemoptysis: Denies Pain with swallowing: Denies Swallowing/choking concerns: Denies Appetite: Good Energy Level: Good  Post radiation skin Changes: Denies   BP (!) 145/69 (BP Location: Left Arm, Patient Position: Sitting)   Pulse 64   Temp (!) 97.5 F (36.4 C) (Temporal)   Resp 18   Ht 5' (1.524 m)   Wt 95 lb 8 oz (43.3 kg)   LMP 12/31/1992   SpO2 97%   BMI 18.65 kg/m   Additional comments if applicable:

## 2024-01-17 DIAGNOSIS — H0100A Unspecified blepharitis right eye, upper and lower eyelids: Secondary | ICD-10-CM | POA: Diagnosis not present

## 2024-01-17 DIAGNOSIS — H02054 Trichiasis without entropian left upper eyelid: Secondary | ICD-10-CM | POA: Diagnosis not present

## 2024-01-17 DIAGNOSIS — H02051 Trichiasis without entropian right upper eyelid: Secondary | ICD-10-CM | POA: Diagnosis not present

## 2024-01-17 DIAGNOSIS — H0100B Unspecified blepharitis left eye, upper and lower eyelids: Secondary | ICD-10-CM | POA: Diagnosis not present

## 2024-01-20 DIAGNOSIS — H02133 Senile ectropion of right eye, unspecified eyelid: Secondary | ICD-10-CM | POA: Diagnosis not present

## 2024-01-20 DIAGNOSIS — H353132 Nonexudative age-related macular degeneration, bilateral, intermediate dry stage: Secondary | ICD-10-CM | POA: Diagnosis not present

## 2024-01-20 DIAGNOSIS — H532 Diplopia: Secondary | ICD-10-CM | POA: Diagnosis not present

## 2024-01-20 DIAGNOSIS — H353221 Exudative age-related macular degeneration, left eye, with active choroidal neovascularization: Secondary | ICD-10-CM | POA: Diagnosis not present

## 2024-01-20 DIAGNOSIS — M436 Torticollis: Secondary | ICD-10-CM | POA: Diagnosis not present

## 2024-02-24 ENCOUNTER — Ambulatory Visit: Payer: Medicare PPO | Admitting: Podiatry

## 2024-02-24 ENCOUNTER — Encounter: Payer: Self-pay | Admitting: Podiatry

## 2024-02-24 DIAGNOSIS — M79674 Pain in right toe(s): Secondary | ICD-10-CM

## 2024-02-24 DIAGNOSIS — M79675 Pain in left toe(s): Secondary | ICD-10-CM

## 2024-02-24 DIAGNOSIS — B351 Tinea unguium: Secondary | ICD-10-CM

## 2024-02-24 NOTE — Progress Notes (Signed)
 This patient returns to the office for evaluation and treatment of long thick painful nails .  This patient is unable to trim his own nails since the patient cannot reach his feet.  Patient says the nails are painful walking and wearing his shoes.  He returns for preventive foot care services.  General Appearance  Alert, conversant and in no acute stress.  Vascular  Dorsalis pedis and posterior tibial  pulses are weakly  palpable  bilaterally.  Capillary return is within normal limits  bilaterally. Cold feet  Bilaterally. Absent digital hair.  Neurologic  Senn-Weinstein monofilament wire test within normal limits  bilaterally. Muscle power within normal limits bilaterally.  Nails Thick disfigured discolored nails with subungual debris  from hallux to fifth toes bilaterally. No evidence of bacterial infection or drainage bilaterally.  Orthopedic  No limitations of motion  feet .  No crepitus or effusions noted.  Hammer toes second  left.  HAV 1st MPJ  B/L.  Skin  normotropic skin with no porokeratosis noted bilaterally.  No signs of infections or ulcers noted.     Onychomycosis  Pain in toes right foot  Pain in toes left foot  Debridement  of nails  1-5  B/L with a nail nipper.  Nails were then filed using a dremel tool with no incidents.    RTC 3 months    Helane Gunther DPM

## 2024-03-24 DIAGNOSIS — M7121 Synovial cyst of popliteal space [Baker], right knee: Secondary | ICD-10-CM | POA: Diagnosis not present

## 2024-03-24 DIAGNOSIS — H353221 Exudative age-related macular degeneration, left eye, with active choroidal neovascularization: Secondary | ICD-10-CM | POA: Diagnosis not present

## 2024-03-24 DIAGNOSIS — H532 Diplopia: Secondary | ICD-10-CM | POA: Diagnosis not present

## 2024-03-24 DIAGNOSIS — E785 Hyperlipidemia, unspecified: Secondary | ICD-10-CM | POA: Diagnosis not present

## 2024-03-24 DIAGNOSIS — I10 Essential (primary) hypertension: Secondary | ICD-10-CM | POA: Diagnosis not present

## 2024-03-24 DIAGNOSIS — C3492 Malignant neoplasm of unspecified part of left bronchus or lung: Secondary | ICD-10-CM | POA: Diagnosis not present

## 2024-03-24 DIAGNOSIS — M1990 Unspecified osteoarthritis, unspecified site: Secondary | ICD-10-CM | POA: Diagnosis not present

## 2024-03-24 DIAGNOSIS — K439 Ventral hernia without obstruction or gangrene: Secondary | ICD-10-CM | POA: Diagnosis not present

## 2024-03-25 DIAGNOSIS — L308 Other specified dermatitis: Secondary | ICD-10-CM | POA: Diagnosis not present

## 2024-03-25 DIAGNOSIS — D0439 Carcinoma in situ of skin of other parts of face: Secondary | ICD-10-CM | POA: Diagnosis not present

## 2024-03-25 DIAGNOSIS — H02133 Senile ectropion of right eye, unspecified eyelid: Secondary | ICD-10-CM | POA: Diagnosis not present

## 2024-03-25 DIAGNOSIS — M436 Torticollis: Secondary | ICD-10-CM | POA: Diagnosis not present

## 2024-03-25 DIAGNOSIS — H532 Diplopia: Secondary | ICD-10-CM | POA: Diagnosis not present

## 2024-03-25 DIAGNOSIS — L570 Actinic keratosis: Secondary | ICD-10-CM | POA: Diagnosis not present

## 2024-03-25 DIAGNOSIS — H353221 Exudative age-related macular degeneration, left eye, with active choroidal neovascularization: Secondary | ICD-10-CM | POA: Diagnosis not present

## 2024-03-25 DIAGNOSIS — D485 Neoplasm of uncertain behavior of skin: Secondary | ICD-10-CM | POA: Diagnosis not present

## 2024-03-25 DIAGNOSIS — Z85828 Personal history of other malignant neoplasm of skin: Secondary | ICD-10-CM | POA: Diagnosis not present

## 2024-03-25 DIAGNOSIS — L812 Freckles: Secondary | ICD-10-CM | POA: Diagnosis not present

## 2024-03-25 DIAGNOSIS — H353132 Nonexudative age-related macular degeneration, bilateral, intermediate dry stage: Secondary | ICD-10-CM | POA: Diagnosis not present

## 2024-04-08 DIAGNOSIS — M7121 Synovial cyst of popliteal space [Baker], right knee: Secondary | ICD-10-CM | POA: Diagnosis not present

## 2024-04-08 DIAGNOSIS — M170 Bilateral primary osteoarthritis of knee: Secondary | ICD-10-CM | POA: Diagnosis not present

## 2024-04-08 DIAGNOSIS — M25561 Pain in right knee: Secondary | ICD-10-CM | POA: Diagnosis not present

## 2024-05-07 DIAGNOSIS — M25511 Pain in right shoulder: Secondary | ICD-10-CM | POA: Diagnosis not present

## 2024-05-07 DIAGNOSIS — M75101 Unspecified rotator cuff tear or rupture of right shoulder, not specified as traumatic: Secondary | ICD-10-CM | POA: Diagnosis not present

## 2024-05-07 DIAGNOSIS — M25611 Stiffness of right shoulder, not elsewhere classified: Secondary | ICD-10-CM | POA: Diagnosis not present

## 2024-05-08 DIAGNOSIS — S46011A Strain of muscle(s) and tendon(s) of the rotator cuff of right shoulder, initial encounter: Secondary | ICD-10-CM | POA: Diagnosis not present

## 2024-05-26 ENCOUNTER — Encounter: Payer: Self-pay | Admitting: Podiatry

## 2024-05-26 ENCOUNTER — Ambulatory Visit: Payer: Medicare PPO | Admitting: Podiatry

## 2024-05-26 DIAGNOSIS — B351 Tinea unguium: Secondary | ICD-10-CM | POA: Diagnosis not present

## 2024-05-26 DIAGNOSIS — M79675 Pain in left toe(s): Secondary | ICD-10-CM | POA: Diagnosis not present

## 2024-05-26 DIAGNOSIS — M79674 Pain in right toe(s): Secondary | ICD-10-CM

## 2024-05-26 NOTE — Progress Notes (Signed)
 This patient returns to the office for evaluation and treatment of long thick painful nails .  This patient is unable to trim his own nails since the patient cannot reach his feet.  Patient says the nails are painful walking and wearing his shoes.  He returns for preventive foot care services.  General Appearance  Alert, conversant and in no acute stress.  Vascular  Dorsalis pedis and posterior tibial  pulses are weakly  palpable  bilaterally.  Capillary return is within normal limits  bilaterally. Cold feet  Bilaterally. Absent digital hair.  Neurologic  Senn-Weinstein monofilament wire test within normal limits  bilaterally. Muscle power within normal limits bilaterally.  Nails Thick disfigured discolored nails with subungual debris  from hallux to fifth toes bilaterally. No evidence of bacterial infection or drainage bilaterally.  Orthopedic  No limitations of motion  feet .  No crepitus or effusions noted.  Hammer toes second  left.  HAV 1st MPJ  B/L.  Skin  normotropic skin with no porokeratosis noted bilaterally.  No signs of infections or ulcers noted.     Onychomycosis  Pain in toes right foot  Pain in toes left foot  Debridement  of nails  1-5  B/L with a nail nipper.  Nails were then filed using a dremel tool with no incidents.    RTC 3 months    Helane Gunther DPM

## 2024-05-28 DIAGNOSIS — H353221 Exudative age-related macular degeneration, left eye, with active choroidal neovascularization: Secondary | ICD-10-CM | POA: Diagnosis not present

## 2024-05-28 DIAGNOSIS — M436 Torticollis: Secondary | ICD-10-CM | POA: Diagnosis not present

## 2024-05-28 DIAGNOSIS — H538 Other visual disturbances: Secondary | ICD-10-CM | POA: Diagnosis not present

## 2024-05-28 DIAGNOSIS — H532 Diplopia: Secondary | ICD-10-CM | POA: Diagnosis not present

## 2024-05-28 DIAGNOSIS — H353132 Nonexudative age-related macular degeneration, bilateral, intermediate dry stage: Secondary | ICD-10-CM | POA: Diagnosis not present

## 2024-05-28 DIAGNOSIS — H02133 Senile ectropion of right eye, unspecified eyelid: Secondary | ICD-10-CM | POA: Diagnosis not present

## 2024-06-01 DIAGNOSIS — M25611 Stiffness of right shoulder, not elsewhere classified: Secondary | ICD-10-CM | POA: Diagnosis not present

## 2024-06-01 DIAGNOSIS — S46011D Strain of muscle(s) and tendon(s) of the rotator cuff of right shoulder, subsequent encounter: Secondary | ICD-10-CM | POA: Diagnosis not present

## 2024-06-01 DIAGNOSIS — M6281 Muscle weakness (generalized): Secondary | ICD-10-CM | POA: Diagnosis not present

## 2024-06-09 DIAGNOSIS — M25611 Stiffness of right shoulder, not elsewhere classified: Secondary | ICD-10-CM | POA: Diagnosis not present

## 2024-06-09 DIAGNOSIS — M6281 Muscle weakness (generalized): Secondary | ICD-10-CM | POA: Diagnosis not present

## 2024-06-09 DIAGNOSIS — S46011D Strain of muscle(s) and tendon(s) of the rotator cuff of right shoulder, subsequent encounter: Secondary | ICD-10-CM | POA: Diagnosis not present

## 2024-06-11 ENCOUNTER — Telehealth: Payer: Self-pay | Admitting: *Deleted

## 2024-06-11 NOTE — Telephone Encounter (Signed)
 CALLED PATIENT TO INFORM OF CT FOR 06/16/24- ARRIVAL TIME- 2:45 PM @ [facility], NO RESTRICTIONS TO SCAN, PATIENT TO RECEIVE RESULTS FROM DR. KINARD ON 06/25/24 @ 10:30 AM,. LVM FOR A RETURN CALL

## 2024-06-12 ENCOUNTER — Telehealth: Payer: Self-pay | Admitting: *Deleted

## 2024-06-12 DIAGNOSIS — S46011D Strain of muscle(s) and tendon(s) of the rotator cuff of right shoulder, subsequent encounter: Secondary | ICD-10-CM | POA: Diagnosis not present

## 2024-06-12 DIAGNOSIS — M25611 Stiffness of right shoulder, not elsewhere classified: Secondary | ICD-10-CM | POA: Diagnosis not present

## 2024-06-12 DIAGNOSIS — M6281 Muscle weakness (generalized): Secondary | ICD-10-CM | POA: Diagnosis not present

## 2024-06-12 NOTE — Telephone Encounter (Signed)
Returned patient's phone call, lvm for a return call 

## 2024-06-16 ENCOUNTER — Ambulatory Visit (HOSPITAL_BASED_OUTPATIENT_CLINIC_OR_DEPARTMENT_OTHER)

## 2024-06-16 DIAGNOSIS — M25611 Stiffness of right shoulder, not elsewhere classified: Secondary | ICD-10-CM | POA: Diagnosis not present

## 2024-06-16 DIAGNOSIS — M6281 Muscle weakness (generalized): Secondary | ICD-10-CM | POA: Diagnosis not present

## 2024-06-16 DIAGNOSIS — S46011D Strain of muscle(s) and tendon(s) of the rotator cuff of right shoulder, subsequent encounter: Secondary | ICD-10-CM | POA: Diagnosis not present

## 2024-06-18 DIAGNOSIS — H02102 Unspecified ectropion of right lower eyelid: Secondary | ICD-10-CM | POA: Diagnosis not present

## 2024-06-18 DIAGNOSIS — H0100B Unspecified blepharitis left eye, upper and lower eyelids: Secondary | ICD-10-CM | POA: Diagnosis not present

## 2024-06-18 DIAGNOSIS — H0100A Unspecified blepharitis right eye, upper and lower eyelids: Secondary | ICD-10-CM | POA: Diagnosis not present

## 2024-06-18 DIAGNOSIS — M25611 Stiffness of right shoulder, not elsewhere classified: Secondary | ICD-10-CM | POA: Diagnosis not present

## 2024-06-18 DIAGNOSIS — M6281 Muscle weakness (generalized): Secondary | ICD-10-CM | POA: Diagnosis not present

## 2024-06-18 DIAGNOSIS — S46011D Strain of muscle(s) and tendon(s) of the rotator cuff of right shoulder, subsequent encounter: Secondary | ICD-10-CM | POA: Diagnosis not present

## 2024-06-19 ENCOUNTER — Ambulatory Visit (HOSPITAL_BASED_OUTPATIENT_CLINIC_OR_DEPARTMENT_OTHER)
Admission: RE | Admit: 2024-06-19 | Discharge: 2024-06-19 | Disposition: A | Discharge status: Home / Self Care | Source: Ambulatory Visit | Attending: Radiology | Admitting: Radiology

## 2024-06-19 DIAGNOSIS — R911 Solitary pulmonary nodule: Secondary | ICD-10-CM | POA: Diagnosis not present

## 2024-06-19 DIAGNOSIS — C3490 Malignant neoplasm of unspecified part of unspecified bronchus or lung: Secondary | ICD-10-CM | POA: Diagnosis not present

## 2024-06-19 DIAGNOSIS — I700 Atherosclerosis of aorta: Secondary | ICD-10-CM | POA: Diagnosis not present

## 2024-06-24 NOTE — Progress Notes (Signed)
 Radiation Oncology         (336) [phone] ________________________________  Name: Kimberly Jordan MRN: [id number]  Date: 06/25/2024  DOB: January 06, 1929  Follow-Up Visit Note  CC: Janey Santos, MD  Shyrl Linnie KIDD, MD  No diagnosis found.  Diagnosis:  The primary encounter diagnosis was Lung nodule. Diagnoses of History of breast cancer and Solitary pulmonary nodule were also pertinent to this visit.    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 She was last seen in office on 12/18/24 for a routine follow up. Patient continued to follow up with their specialists to manage their chronic conditions.   Most recent CT chest preformed on 06/19/24 showed ***                             No other significant oncologic interval history since the patient was last seen.   Allergies:  is allergic to fosamax [alendronate], hydrocodone , indomethacin, penicillin g sodium, and tramadol.  Meds: Current Outpatient Medications  Medication Sig Dispense Refill   amLODipine  (NORVASC ) 10 MG tablet Take 1 tablet by mouth daily.     aspirin  81 MG tablet Take 81 mg by mouth daily.     Calcium Carbonate-Vitamin D (CALCIUM + D PO) Take 600 mg by mouth 3 (three) times daily.     carvedilol  (COREG ) 12.5 MG tablet TAKE ONE TABLET BY MOUTH TWICE DAILY WITH FOOD. 180 tablet 3   clobetasol  cream (TEMOVATE ) 0.05 % Apply 1 application topically as needed (For itching on back).      CREON 24000 units CPEP Take 3 capsules by mouth 3 (three) times daily. Takes 2- 24000 and 1- 36000 TID     doxycycline (VIBRA-TABS) 100 MG tablet Take 100 mg by mouth every evening.      erythromycin ophthalmic ointment      MYRBETRIQ 50 MG TB24 tablet      OVER THE COUNTER MEDICATION Place 1 application   into both eyes as needed. occu scrub     simvastatin  (ZOCOR ) 20 MG tablet Take 20 mg by mouth every evening.      telmisartan  (MICARDIS ) 80 MG tablet Take 1 tablet (80 mg total) by mouth daily. 90 tablet 1   zoledronic  acid (RECLAST ) 5 MG/100ML SOLN injection Inject 5 mg into the vein once. Yearly medication,  given in Jan.     No current facility-administered medications for this encounter.    Physical Findings: The patient is in no acute distress. Patient is alert and oriented.  vitals were not taken for this visit. .  No significant changes. Lungs are clear to auscultation bilaterally. Heart has regular rate and rhythm. No palpable cervical, supraclavicular, or axillary adenopathy. Abdomen soft, non-tender, normal bowel sounds.   Lab Findings: Lab Results  Component Value Date   WBC 7.4 10/15/2014   HGB 11.8 (L) 10/15/2014   HCT 34.0 (L) 10/15/2014   MCV 96.3 10/15/2014   PLT 144 (L) 10/15/2014    Radiographic Findings: No results found.  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  ***  Plan:  ***   *** minutes of total time was spent for this patient encounter, including preparation, face-to-face  counseling with the patient and coordination of care, physical exam, and documentation of the encounter. ____________________________________  Lynwood CHARM Nasuti, PhD, MD  This document serves as a record of services personally performed by Lynwood Nasuti, MD. It was created on his behalf by Reymundo Cartwright, a trained medical scribe. The creation of this record is based on the scribe's personal observations and the provider's statements to them. This document has been checked and approved by the attending provider.

## 2024-06-25 ENCOUNTER — Ambulatory Visit
Admission: RE | Admit: 2024-06-25 | Discharge: 2024-06-25 | Disposition: A | Discharge status: Home / Self Care | Payer: Self-pay | Source: Ambulatory Visit | Attending: Radiation Oncology | Admitting: Radiation Oncology

## 2024-06-25 ENCOUNTER — Encounter: Payer: Self-pay | Admitting: Radiation Oncology

## 2024-06-25 VITALS — BP 142/65 | HR 64 | Temp 97.1°F | Resp 18 | Wt 92.1 lb

## 2024-06-25 DIAGNOSIS — Z923 Personal history of irradiation: Secondary | ICD-10-CM | POA: Insufficient documentation

## 2024-06-25 DIAGNOSIS — M40209 Unspecified kyphosis, site unspecified: Secondary | ICD-10-CM | POA: Insufficient documentation

## 2024-06-25 DIAGNOSIS — Z853 Personal history of malignant neoplasm of breast: Secondary | ICD-10-CM | POA: Insufficient documentation

## 2024-06-25 DIAGNOSIS — J432 Centrilobular emphysema: Secondary | ICD-10-CM | POA: Insufficient documentation

## 2024-06-25 DIAGNOSIS — M8580 Other specified disorders of bone density and structure, unspecified site: Secondary | ICD-10-CM | POA: Insufficient documentation

## 2024-06-25 DIAGNOSIS — R918 Other nonspecific abnormal finding of lung field: Secondary | ICD-10-CM | POA: Insufficient documentation

## 2024-06-25 DIAGNOSIS — I700 Atherosclerosis of aorta: Secondary | ICD-10-CM | POA: Diagnosis not present

## 2024-06-25 DIAGNOSIS — R911 Solitary pulmonary nodule: Secondary | ICD-10-CM | POA: Diagnosis not present

## 2024-06-25 DIAGNOSIS — K7689 Other specified diseases of liver: Secondary | ICD-10-CM | POA: Diagnosis not present

## 2024-06-25 DIAGNOSIS — Z79899 Other long term (current) drug therapy: Secondary | ICD-10-CM | POA: Insufficient documentation

## 2024-06-25 DIAGNOSIS — Z7982 Long term (current) use of aspirin: Secondary | ICD-10-CM | POA: Insufficient documentation

## 2024-06-25 NOTE — Progress Notes (Signed)
 Kimberly Jordan is here today for follow up post radiation to the lung.  Lung Side: left, patient completed treatment on 04/25/24  Does the patient complain of any of the following: Pain: Patient denies pain to treatment field. Complains of pain to right shoulder due to a torn rotator cuff.  Shortness of breath w/wo exertion: Yes, remains at baseline.  Cough: No Hemoptysis: No Pain with swallowing: No Swallowing/choking concerns: No Appetite: good Energy Level: Fair Post radiation skin Changes: No    Additional comments if applicable:  BP (!) 142/65 (BP Location: Left Arm)   Pulse 64   Temp (!) 97.1 F (36.2 C) (Temporal)   Resp 18   Wt 92 lb 2 oz (41.8 kg)   LMP 12/31/1992   SpO2 98%   BMI 17.99 kg/m

## 2024-06-26 DIAGNOSIS — M25511 Pain in right shoulder: Secondary | ICD-10-CM | POA: Diagnosis not present

## 2024-06-30 DIAGNOSIS — M6281 Muscle weakness (generalized): Secondary | ICD-10-CM | POA: Diagnosis not present

## 2024-06-30 DIAGNOSIS — M25611 Stiffness of right shoulder, not elsewhere classified: Secondary | ICD-10-CM | POA: Diagnosis not present

## 2024-06-30 DIAGNOSIS — S46011D Strain of muscle(s) and tendon(s) of the rotator cuff of right shoulder, subsequent encounter: Secondary | ICD-10-CM | POA: Diagnosis not present

## 2024-07-02 DIAGNOSIS — H02105 Unspecified ectropion of left lower eyelid: Secondary | ICD-10-CM | POA: Diagnosis not present

## 2024-07-02 DIAGNOSIS — H0100B Unspecified blepharitis left eye, upper and lower eyelids: Secondary | ICD-10-CM | POA: Diagnosis not present

## 2024-07-02 DIAGNOSIS — H0100A Unspecified blepharitis right eye, upper and lower eyelids: Secondary | ICD-10-CM | POA: Diagnosis not present

## 2024-07-02 DIAGNOSIS — H02102 Unspecified ectropion of right lower eyelid: Secondary | ICD-10-CM | POA: Diagnosis not present

## 2024-07-07 DIAGNOSIS — S46011D Strain of muscle(s) and tendon(s) of the rotator cuff of right shoulder, subsequent encounter: Secondary | ICD-10-CM | POA: Diagnosis not present

## 2024-07-07 DIAGNOSIS — M6281 Muscle weakness (generalized): Secondary | ICD-10-CM | POA: Diagnosis not present

## 2024-07-07 DIAGNOSIS — M25611 Stiffness of right shoulder, not elsewhere classified: Secondary | ICD-10-CM | POA: Diagnosis not present

## 2024-07-15 DIAGNOSIS — M6281 Muscle weakness (generalized): Secondary | ICD-10-CM | POA: Diagnosis not present

## 2024-07-15 DIAGNOSIS — S46011D Strain of muscle(s) and tendon(s) of the rotator cuff of right shoulder, subsequent encounter: Secondary | ICD-10-CM | POA: Diagnosis not present

## 2024-07-15 DIAGNOSIS — M25611 Stiffness of right shoulder, not elsewhere classified: Secondary | ICD-10-CM | POA: Diagnosis not present

## 2024-07-21 DIAGNOSIS — M25611 Stiffness of right shoulder, not elsewhere classified: Secondary | ICD-10-CM | POA: Diagnosis not present

## 2024-07-21 DIAGNOSIS — S46011D Strain of muscle(s) and tendon(s) of the rotator cuff of right shoulder, subsequent encounter: Secondary | ICD-10-CM | POA: Diagnosis not present

## 2024-07-21 DIAGNOSIS — M6281 Muscle weakness (generalized): Secondary | ICD-10-CM | POA: Diagnosis not present

## 2024-07-27 DIAGNOSIS — M7121 Synovial cyst of popliteal space [Baker], right knee: Secondary | ICD-10-CM | POA: Diagnosis not present

## 2024-07-27 DIAGNOSIS — I10 Essential (primary) hypertension: Secondary | ICD-10-CM | POA: Diagnosis not present

## 2024-07-27 DIAGNOSIS — R2681 Unsteadiness on feet: Secondary | ICD-10-CM | POA: Diagnosis not present

## 2024-07-28 DIAGNOSIS — M6281 Muscle weakness (generalized): Secondary | ICD-10-CM | POA: Diagnosis not present

## 2024-07-28 DIAGNOSIS — M25611 Stiffness of right shoulder, not elsewhere classified: Secondary | ICD-10-CM | POA: Diagnosis not present

## 2024-07-28 DIAGNOSIS — S46011D Strain of muscle(s) and tendon(s) of the rotator cuff of right shoulder, subsequent encounter: Secondary | ICD-10-CM | POA: Diagnosis not present

## 2024-08-03 DIAGNOSIS — H353221 Exudative age-related macular degeneration, left eye, with active choroidal neovascularization: Secondary | ICD-10-CM | POA: Diagnosis not present

## 2024-08-03 DIAGNOSIS — H02133 Senile ectropion of right eye, unspecified eyelid: Secondary | ICD-10-CM | POA: Diagnosis not present

## 2024-08-03 DIAGNOSIS — H353132 Nonexudative age-related macular degeneration, bilateral, intermediate dry stage: Secondary | ICD-10-CM | POA: Diagnosis not present

## 2024-08-25 ENCOUNTER — Ambulatory Visit: Admitting: Podiatry

## 2024-08-25 ENCOUNTER — Encounter: Payer: Self-pay | Admitting: Podiatry

## 2024-08-25 DIAGNOSIS — B351 Tinea unguium: Secondary | ICD-10-CM

## 2024-08-25 DIAGNOSIS — M79675 Pain in left toe(s): Secondary | ICD-10-CM

## 2024-08-25 DIAGNOSIS — M79674 Pain in right toe(s): Secondary | ICD-10-CM

## 2024-08-25 NOTE — Progress Notes (Signed)
 This patient returns to the office for evaluation and treatment of long thick painful nails .  This patient is unable to trim his own nails since the patient cannot reach his feet.  Patient says the nails are painful walking and wearing his shoes.  He returns for preventive foot care services.  General Appearance  Alert, conversant and in no acute stress.  Vascular  Dorsalis pedis and posterior tibial  pulses are weakly  palpable  bilaterally.  Capillary return is within normal limits  bilaterally. Cold feet  Bilaterally. Absent digital hair.  Neurologic  Senn-Weinstein monofilament wire test within normal limits  bilaterally. Muscle power within normal limits bilaterally.  Nails Thick disfigured discolored nails with subungual debris  from hallux to fifth toes bilaterally. No evidence of bacterial infection or drainage bilaterally.  Orthopedic  No limitations of motion  feet .  No crepitus or effusions noted.  Hammer toes second  left.  HAV 1st MPJ  B/L.  Skin  normotropic skin with no porokeratosis noted bilaterally.  No signs of infections or ulcers noted.     Onychomycosis  Pain in toes right foot  Pain in toes left foot  Debridement  of nails  1-5  B/L with a nail nipper.  Nails were then filed using a dremel tool with no incidents.    RTC 3 months    Helane Gunther DPM

## 2024-08-26 ENCOUNTER — Ambulatory Visit: Admitting: Podiatry

## 2024-09-15 ENCOUNTER — Telehealth: Payer: Self-pay | Admitting: *Deleted

## 2024-09-15 NOTE — Telephone Encounter (Signed)
 CALLED PATIENT TO INFORM OF CT FOR 09-25-24- ARRIVAL TIME- 2:15 PM @ [facility], NO RESTRICTIONS TO SCAN, PATIENT TO RECEIVE RESULTS FROM DR. KINARD ON 10-01-24 @ 11 AM, LVM FOR A RETURN CALL

## 2024-09-25 ENCOUNTER — Ambulatory Visit (HOSPITAL_COMMUNITY)
Admission: RE | Admit: 2024-09-25 | Discharge: 2024-09-25 | Disposition: A | Discharge status: Home / Self Care | Source: Ambulatory Visit | Attending: Radiology | Admitting: Radiology

## 2024-09-25 DIAGNOSIS — J432 Centrilobular emphysema: Secondary | ICD-10-CM | POA: Diagnosis not present

## 2024-09-25 DIAGNOSIS — R918 Other nonspecific abnormal finding of lung field: Secondary | ICD-10-CM | POA: Insufficient documentation

## 2024-09-30 DIAGNOSIS — L723 Sebaceous cyst: Secondary | ICD-10-CM | POA: Diagnosis not present

## 2024-09-30 DIAGNOSIS — L812 Freckles: Secondary | ICD-10-CM | POA: Diagnosis not present

## 2024-09-30 DIAGNOSIS — L821 Other seborrheic keratosis: Secondary | ICD-10-CM | POA: Diagnosis not present

## 2024-09-30 DIAGNOSIS — L570 Actinic keratosis: Secondary | ICD-10-CM | POA: Diagnosis not present

## 2024-09-30 DIAGNOSIS — Z85828 Personal history of other malignant neoplasm of skin: Secondary | ICD-10-CM | POA: Diagnosis not present

## 2024-09-30 DIAGNOSIS — L853 Xerosis cutis: Secondary | ICD-10-CM | POA: Diagnosis not present

## 2024-09-30 NOTE — Progress Notes (Signed)
 Radiation Oncology         [phone] ________________________________  Name: Kimberly Jordan MRN: [id number]  Date: 10/01/2024  DOB: 1929/12/27  Follow-Up Visit Note  CC: Janey Santos, MD  Shyrl Linnie KIDD, MD  No diagnosis found.  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 She was last seen in office on 12/18/24 for a routine follow up. Patient continued to follow up with their specialists to manage their chronic conditions.    Most recent CT chest preformed on 09/25/24 showed ***                              Allergies:  is allergic to fosamax [alendronate], hydrocodone , indomethacin, penicillin g sodium, and tramadol.  Meds: Current Outpatient Medications  Medication Sig Dispense Refill   amLODipine  (NORVASC ) 10 MG tablet Take 1 tablet by mouth daily.     aspirin  81 MG tablet Take 81 mg by mouth daily.     Calcium Carbonate-Vitamin D (CALCIUM + D PO) Take 600 mg by mouth 3 (three) times daily.     carvedilol  (COREG ) 12.5 MG tablet TAKE ONE TABLET BY MOUTH TWICE DAILY WITH FOOD. 180 tablet 3   clobetasol  cream (TEMOVATE ) 0.05 % Apply 1 application topically as needed (For itching on back).      CREON 24000 units CPEP Take 3 capsules by mouth 3 (three) times daily. Takes 2- 24000 and 1- 36000 TID     doxycycline (VIBRA-TABS) 100 MG tablet Take 100 mg by mouth every evening.      erythromycin ophthalmic ointment      MYRBETRIQ 50 MG TB24 tablet      OVER THE COUNTER MEDICATION Place 1 application  into both eyes as needed. occu scrub     simvastatin  (ZOCOR ) 20 MG tablet Take 20 mg by mouth every evening.      telmisartan  (MICARDIS ) 80 MG tablet Take 1 tablet (80 mg total) by mouth daily. 90 tablet 1   zoledronic   acid (RECLAST ) 5 MG/100ML SOLN injection Inject 5 mg into the vein once. Yearly medication,  given in Jan.     No current facility-administered medications for this visit.    Physical Findings: The patient is in no acute distress. Patient is alert and oriented.  vitals were not taken for this visit. .  No significant changes. Lungs are clear to auscultation bilaterally. Heart has regular rate and rhythm. No palpable cervical, supraclavicular, or axillary adenopathy. Abdomen soft, non-tender, normal bowel sounds.   Lab Findings: Lab Results  Component Value Date   WBC 7.4 10/15/2014   HGB 11.8 (L) 10/15/2014   HCT 34.0 (L) 10/15/2014   MCV 96.3 10/15/2014   PLT 144 (L) 10/15/2014    Radiographic Findings: No results found.  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  ***  Plan:  ***   *** minutes of total time was spent for this patient encounter, including preparation, face-to-face counseling with the patient and coordination of care, physical exam, and documentation of the encounter. ____________________________________  Lynwood CHARM Nasuti, PhD, MD  This document serves as a record of services  personally performed by Lynwood Nasuti, MD. It was created on his behalf by Reymundo Cartwright, a trained medical scribe. The creation of this record is based on the scribe's personal observations and the provider's statements to them. This document has been checked and approved by the attending provider.

## 2024-10-01 ENCOUNTER — Encounter: Payer: Self-pay | Admitting: Radiation Oncology

## 2024-10-01 ENCOUNTER — Ambulatory Visit
Admission: RE | Admit: 2024-10-01 | Discharge: 2024-10-01 | Disposition: A | Discharge status: Home / Self Care | Payer: Self-pay | Source: Ambulatory Visit | Attending: Radiation Oncology | Admitting: Radiation Oncology

## 2024-10-01 VITALS — BP 122/54 | HR 63 | Temp 97.7°F | Resp 20 | Ht 60.0 in | Wt 93.2 lb

## 2024-10-01 DIAGNOSIS — Z923 Personal history of irradiation: Secondary | ICD-10-CM | POA: Insufficient documentation

## 2024-10-01 DIAGNOSIS — J432 Centrilobular emphysema: Secondary | ICD-10-CM | POA: Insufficient documentation

## 2024-10-01 DIAGNOSIS — Z79899 Other long term (current) drug therapy: Secondary | ICD-10-CM | POA: Insufficient documentation

## 2024-10-01 DIAGNOSIS — I722 Aneurysm of renal artery: Secondary | ICD-10-CM | POA: Insufficient documentation

## 2024-10-01 DIAGNOSIS — K7689 Other specified diseases of liver: Secondary | ICD-10-CM | POA: Diagnosis not present

## 2024-10-01 DIAGNOSIS — M40204 Unspecified kyphosis, thoracic region: Secondary | ICD-10-CM | POA: Diagnosis not present

## 2024-10-01 DIAGNOSIS — R918 Other nonspecific abnormal finding of lung field: Secondary | ICD-10-CM | POA: Insufficient documentation

## 2024-10-01 DIAGNOSIS — I517 Cardiomegaly: Secondary | ICD-10-CM | POA: Diagnosis not present

## 2024-10-01 DIAGNOSIS — Z7982 Long term (current) use of aspirin: Secondary | ICD-10-CM | POA: Insufficient documentation

## 2024-10-01 DIAGNOSIS — Z853 Personal history of malignant neoplasm of breast: Secondary | ICD-10-CM | POA: Diagnosis not present

## 2024-10-01 DIAGNOSIS — I700 Atherosclerosis of aorta: Secondary | ICD-10-CM | POA: Insufficient documentation

## 2024-10-01 DIAGNOSIS — R911 Solitary pulmonary nodule: Secondary | ICD-10-CM | POA: Diagnosis not present

## 2024-10-01 DIAGNOSIS — I2510 Atherosclerotic heart disease of native coronary artery without angina pectoris: Secondary | ICD-10-CM | POA: Insufficient documentation

## 2024-10-01 NOTE — Progress Notes (Signed)
 Kimberly Jordan is here today for follow up post radiation to the lung.  Lung Side: Left Lung Radiation treatment dates: 04/18/21-04/25/21  Does the patient complain of any of the following: Pain:Denies Shortness of breath w/wo exertion: Denies Cough: Denies Hemoptysis: Denies Pain with swallowing: Denies Swallowing/choking concerns: Denies Appetite: Good Energy Level: Fair Post radiation skin Changes: Denies    Additional comments if applicable: None just waiting to hear results from recent imaging.  BP (!) 122/54 (BP Location: Right Arm, Patient Position: Sitting, Cuff Size: Small)   Pulse 63   Temp 97.7 F (36.5 C)   Resp 20   Ht 5' (1.524 m)   Wt 93 lb 3.2 oz (42.3 kg)   LMP 12/31/1992   SpO2 98%   BMI 18.20 kg/m

## 2024-10-06 DIAGNOSIS — M810 Age-related osteoporosis without current pathological fracture: Secondary | ICD-10-CM | POA: Diagnosis not present

## 2024-10-06 DIAGNOSIS — I10 Essential (primary) hypertension: Secondary | ICD-10-CM | POA: Diagnosis not present

## 2024-10-06 DIAGNOSIS — E7849 Other hyperlipidemia: Secondary | ICD-10-CM | POA: Diagnosis not present

## 2024-10-06 DIAGNOSIS — E785 Hyperlipidemia, unspecified: Secondary | ICD-10-CM | POA: Diagnosis not present

## 2024-10-06 DIAGNOSIS — Z1389 Encounter for screening for other disorder: Secondary | ICD-10-CM | POA: Diagnosis not present

## 2024-10-12 DIAGNOSIS — H10023 Other mucopurulent conjunctivitis, bilateral: Secondary | ICD-10-CM | POA: Diagnosis not present

## 2024-10-12 DIAGNOSIS — H532 Diplopia: Secondary | ICD-10-CM | POA: Diagnosis not present

## 2024-10-12 DIAGNOSIS — H353132 Nonexudative age-related macular degeneration, bilateral, intermediate dry stage: Secondary | ICD-10-CM | POA: Diagnosis not present

## 2024-10-12 DIAGNOSIS — M436 Torticollis: Secondary | ICD-10-CM | POA: Diagnosis not present

## 2024-10-12 DIAGNOSIS — H538 Other visual disturbances: Secondary | ICD-10-CM | POA: Diagnosis not present

## 2024-10-12 DIAGNOSIS — H02133 Senile ectropion of right eye, unspecified eyelid: Secondary | ICD-10-CM | POA: Diagnosis not present

## 2024-10-12 DIAGNOSIS — H353221 Exudative age-related macular degeneration, left eye, with active choroidal neovascularization: Secondary | ICD-10-CM | POA: Diagnosis not present

## 2024-10-13 DIAGNOSIS — Z0000 Encounter for general adult medical examination without abnormal findings: Secondary | ICD-10-CM | POA: Diagnosis not present

## 2024-10-13 DIAGNOSIS — Z1339 Encounter for screening examination for other mental health and behavioral disorders: Secondary | ICD-10-CM | POA: Diagnosis not present

## 2024-10-13 DIAGNOSIS — D692 Other nonthrombocytopenic purpura: Secondary | ICD-10-CM | POA: Diagnosis not present

## 2024-10-13 DIAGNOSIS — M7121 Synovial cyst of popliteal space [Baker], right knee: Secondary | ICD-10-CM | POA: Diagnosis not present

## 2024-10-13 DIAGNOSIS — C3492 Malignant neoplasm of unspecified part of left bronchus or lung: Secondary | ICD-10-CM | POA: Diagnosis not present

## 2024-10-13 DIAGNOSIS — M75101 Unspecified rotator cuff tear or rupture of right shoulder, not specified as traumatic: Secondary | ICD-10-CM | POA: Diagnosis not present

## 2024-10-13 DIAGNOSIS — Z853 Personal history of malignant neoplasm of breast: Secondary | ICD-10-CM | POA: Diagnosis not present

## 2024-10-13 DIAGNOSIS — E785 Hyperlipidemia, unspecified: Secondary | ICD-10-CM | POA: Diagnosis not present

## 2024-10-13 DIAGNOSIS — Z23 Encounter for immunization: Secondary | ICD-10-CM | POA: Diagnosis not present

## 2024-10-13 DIAGNOSIS — K8681 Exocrine pancreatic insufficiency: Secondary | ICD-10-CM | POA: Diagnosis not present

## 2024-10-13 DIAGNOSIS — I428 Other cardiomyopathies: Secondary | ICD-10-CM | POA: Diagnosis not present

## 2024-10-13 DIAGNOSIS — R82998 Other abnormal findings in urine: Secondary | ICD-10-CM | POA: Diagnosis not present

## 2024-10-13 DIAGNOSIS — Z1331 Encounter for screening for depression: Secondary | ICD-10-CM | POA: Diagnosis not present

## 2024-10-20 DIAGNOSIS — M25561 Pain in right knee: Secondary | ICD-10-CM | POA: Diagnosis not present

## 2024-10-20 DIAGNOSIS — M1711 Unilateral primary osteoarthritis, right knee: Secondary | ICD-10-CM | POA: Diagnosis not present

## 2024-10-20 DIAGNOSIS — M7121 Synovial cyst of popliteal space [Baker], right knee: Secondary | ICD-10-CM | POA: Diagnosis not present

## 2024-10-20 DIAGNOSIS — M1712 Unilateral primary osteoarthritis, left knee: Secondary | ICD-10-CM | POA: Diagnosis not present

## 2024-10-29 DIAGNOSIS — H0100A Unspecified blepharitis right eye, upper and lower eyelids: Secondary | ICD-10-CM | POA: Diagnosis not present

## 2024-10-29 DIAGNOSIS — H11002 Unspecified pterygium of left eye: Secondary | ICD-10-CM | POA: Diagnosis not present

## 2024-10-29 DIAGNOSIS — H0100B Unspecified blepharitis left eye, upper and lower eyelids: Secondary | ICD-10-CM | POA: Diagnosis not present

## 2024-10-29 DIAGNOSIS — H02403 Unspecified ptosis of bilateral eyelids: Secondary | ICD-10-CM | POA: Diagnosis not present

## 2024-10-29 DIAGNOSIS — H353231 Exudative age-related macular degeneration, bilateral, with active choroidal neovascularization: Secondary | ICD-10-CM | POA: Diagnosis not present

## 2024-10-29 DIAGNOSIS — H02102 Unspecified ectropion of right lower eyelid: Secondary | ICD-10-CM | POA: Diagnosis not present

## 2024-11-12 DIAGNOSIS — M7121 Synovial cyst of popliteal space [Baker], right knee: Secondary | ICD-10-CM | POA: Diagnosis not present

## 2024-11-18 DIAGNOSIS — M7121 Synovial cyst of popliteal space [Baker], right knee: Secondary | ICD-10-CM | POA: Diagnosis not present

## 2024-11-24 ENCOUNTER — Ambulatory Visit: Admitting: Podiatry

## 2024-11-25 ENCOUNTER — Encounter: Payer: Self-pay | Admitting: Podiatry

## 2024-11-25 ENCOUNTER — Ambulatory Visit: Admitting: Podiatry

## 2024-11-25 DIAGNOSIS — M79675 Pain in left toe(s): Secondary | ICD-10-CM

## 2024-11-25 DIAGNOSIS — B351 Tinea unguium: Secondary | ICD-10-CM

## 2024-11-25 DIAGNOSIS — M79674 Pain in right toe(s): Secondary | ICD-10-CM | POA: Diagnosis not present

## 2024-11-25 NOTE — Progress Notes (Signed)
 This patient returns to the office for evaluation and treatment of long thick painful nails .  This patient is unable to trim his own nails since the patient cannot reach his feet.  Patient says the nails are painful walking and wearing his shoes.  He returns for preventive foot care services.  General Appearance  Alert, conversant and in no acute stress.  Vascular  Dorsalis pedis and posterior tibial  pulses are weakly  palpable  bilaterally.  Capillary return is within normal limits  bilaterally. Cold feet  Bilaterally. Absent digital hair.  Neurologic  Senn-Weinstein monofilament wire test within normal limits  bilaterally. Muscle power within normal limits bilaterally.  Nails Thick disfigured discolored nails with subungual debris  from hallux to fifth toes bilaterally. No evidence of bacterial infection or drainage bilaterally.  Orthopedic  No limitations of motion  feet .  No crepitus or effusions noted.  Hammer toes second  left.  HAV 1st MPJ  B/L.  Skin  normotropic skin with no porokeratosis noted bilaterally.  No signs of infections or ulcers noted.     Onychomycosis  Pain in toes right foot  Pain in toes left foot  Debridement  of nails  1-5  B/L with a nail nipper.  Nails were then filed using a dremel tool with no incidents.  Dr.  Tobie discussed the cyst with this patient that is behind her right knee.  RTC 3 months    Cordella Bold DPM

## 2024-12-01 DIAGNOSIS — M436 Torticollis: Secondary | ICD-10-CM | POA: Diagnosis not present

## 2024-12-01 DIAGNOSIS — H532 Diplopia: Secondary | ICD-10-CM | POA: Diagnosis not present

## 2024-12-01 DIAGNOSIS — H02133 Senile ectropion of right eye, unspecified eyelid: Secondary | ICD-10-CM | POA: Diagnosis not present

## 2024-12-01 DIAGNOSIS — H353132 Nonexudative age-related macular degeneration, bilateral, intermediate dry stage: Secondary | ICD-10-CM | POA: Diagnosis not present

## 2024-12-01 DIAGNOSIS — H353221 Exudative age-related macular degeneration, left eye, with active choroidal neovascularization: Secondary | ICD-10-CM | POA: Diagnosis not present

## 2024-12-01 DIAGNOSIS — H10023 Other mucopurulent conjunctivitis, bilateral: Secondary | ICD-10-CM | POA: Diagnosis not present

## 2024-12-01 DIAGNOSIS — H538 Other visual disturbances: Secondary | ICD-10-CM | POA: Diagnosis not present

## 2025-02-23 ENCOUNTER — Ambulatory Visit: Admitting: Podiatry

## 2025-04-08 ENCOUNTER — Ambulatory Visit: Payer: Self-pay | Admitting: Radiation Oncology
